# Patient Record
Sex: Male | Born: 1935 | Race: White | Hispanic: Yes
Health system: Southern US, Community
[De-identification: ages and names within clinical notes are randomized; demographics above are authoritative.]

## PROBLEM LIST (undated history)

## (undated) DIAGNOSIS — C801 Malignant (primary) neoplasm, unspecified: Secondary | ICD-10-CM

## (undated) DIAGNOSIS — H269 Unspecified cataract: Secondary | ICD-10-CM

## (undated) DIAGNOSIS — I10 Essential (primary) hypertension: Secondary | ICD-10-CM

## (undated) DIAGNOSIS — K219 Gastro-esophageal reflux disease without esophagitis: Secondary | ICD-10-CM

## (undated) DIAGNOSIS — F419 Anxiety disorder, unspecified: Secondary | ICD-10-CM

## (undated) DIAGNOSIS — F329 Major depressive disorder, single episode, unspecified: Secondary | ICD-10-CM

## (undated) DIAGNOSIS — F32A Depression, unspecified: Secondary | ICD-10-CM

## (undated) DIAGNOSIS — E785 Hyperlipidemia, unspecified: Secondary | ICD-10-CM

## (undated) HISTORY — DX: Hyperlipidemia, unspecified: E78.5

## (undated) HISTORY — PX: FRACTURE SURGERY: SHX138

## (undated) HISTORY — DX: Gastro-esophageal reflux disease without esophagitis: K21.9

## (undated) HISTORY — DX: Unspecified cataract: H26.9

## (undated) HISTORY — PX: HERNIA REPAIR: SHX51

## (undated) HISTORY — DX: Essential (primary) hypertension: I10

---

## 2000-02-25 ENCOUNTER — Encounter: Payer: Self-pay | Admitting: *Deleted

## 2000-02-25 ENCOUNTER — Encounter: Admission: RE | Admit: 2000-02-25 | Discharge: 2000-02-25 | Payer: Self-pay | Admitting: *Deleted

## 2003-03-22 ENCOUNTER — Inpatient Hospital Stay (HOSPITAL_COMMUNITY): Admission: EM | Admit: 2003-03-22 | Discharge: 2003-03-26 | Payer: Self-pay | Admitting: Psychiatry

## 2003-03-27 ENCOUNTER — Other Ambulatory Visit (HOSPITAL_COMMUNITY): Admission: RE | Admit: 2003-03-27 | Discharge: 2003-03-29 | Payer: Self-pay | Admitting: Psychiatry

## 2003-06-06 ENCOUNTER — Encounter: Admission: RE | Admit: 2003-06-06 | Discharge: 2003-06-15 | Payer: Self-pay | Admitting: Internal Medicine

## 2003-07-05 ENCOUNTER — Encounter: Admission: RE | Admit: 2003-07-05 | Discharge: 2003-10-03 | Payer: Self-pay | Admitting: Internal Medicine

## 2003-09-07 ENCOUNTER — Ambulatory Visit (HOSPITAL_COMMUNITY): Admission: RE | Admit: 2003-09-07 | Discharge: 2003-09-07 | Payer: Self-pay | Admitting: Internal Medicine

## 2004-10-18 ENCOUNTER — Inpatient Hospital Stay (HOSPITAL_COMMUNITY): Admission: EM | Admit: 2004-10-18 | Discharge: 2004-10-19 | Payer: Self-pay | Admitting: Emergency Medicine

## 2004-12-07 ENCOUNTER — Inpatient Hospital Stay (HOSPITAL_COMMUNITY): Admission: EM | Admit: 2004-12-07 | Discharge: 2004-12-10 | Payer: Self-pay | Admitting: *Deleted

## 2004-12-09 ENCOUNTER — Encounter: Payer: Self-pay | Admitting: Internal Medicine

## 2004-12-09 ENCOUNTER — Ambulatory Visit: Payer: Self-pay | Admitting: Internal Medicine

## 2005-10-08 ENCOUNTER — Other Ambulatory Visit (HOSPITAL_COMMUNITY): Admission: RE | Admit: 2005-10-08 | Discharge: 2005-10-23 | Payer: Self-pay | Admitting: Psychiatry

## 2005-10-09 ENCOUNTER — Ambulatory Visit: Payer: Self-pay | Admitting: Psychiatry

## 2005-10-29 ENCOUNTER — Ambulatory Visit (HOSPITAL_COMMUNITY): Payer: Self-pay | Admitting: *Deleted

## 2005-12-01 ENCOUNTER — Ambulatory Visit (HOSPITAL_COMMUNITY): Payer: Self-pay | Admitting: *Deleted

## 2006-02-05 ENCOUNTER — Ambulatory Visit (HOSPITAL_COMMUNITY): Payer: Self-pay | Admitting: *Deleted

## 2006-06-29 ENCOUNTER — Ambulatory Visit: Payer: Self-pay | Admitting: Gastroenterology

## 2006-07-09 ENCOUNTER — Ambulatory Visit: Payer: Self-pay | Admitting: Gastroenterology

## 2006-07-09 LAB — CONVERTED CEMR LAB
Ferritin: 51.6 ng/mL (ref 22.0–322.0)
Folate: 17.6 ng/mL
Iron: 30 ug/dL — ABNORMAL LOW (ref 42–165)
Saturation Ratios: 10.5 % — ABNORMAL LOW (ref 20.0–50.0)
Transferrin: 203.4 mg/dL — ABNORMAL LOW (ref >=212.0)
Vitamin B-12: 415 pg/mL (ref 211–911)

## 2006-07-10 ENCOUNTER — Ambulatory Visit: Payer: Self-pay | Admitting: Gastroenterology

## 2006-07-10 ENCOUNTER — Encounter (INDEPENDENT_AMBULATORY_CARE_PROVIDER_SITE_OTHER): Payer: Self-pay | Admitting: Specialist

## 2007-02-15 ENCOUNTER — Encounter: Admission: RE | Admit: 2007-02-15 | Discharge: 2007-02-15 | Payer: Self-pay | Admitting: Internal Medicine

## 2007-03-04 ENCOUNTER — Emergency Department (HOSPITAL_COMMUNITY): Admission: EM | Admit: 2007-03-04 | Discharge: 2007-03-04 | Payer: Self-pay | Admitting: Emergency Medicine

## 2008-10-05 ENCOUNTER — Ambulatory Visit: Payer: Self-pay | Admitting: Internal Medicine

## 2008-10-05 ENCOUNTER — Inpatient Hospital Stay (HOSPITAL_COMMUNITY): Admission: EM | Admit: 2008-10-05 | Discharge: 2008-10-06 | Payer: Self-pay | Admitting: Emergency Medicine

## 2008-10-06 ENCOUNTER — Encounter (INDEPENDENT_AMBULATORY_CARE_PROVIDER_SITE_OTHER): Payer: Self-pay | Admitting: *Deleted

## 2009-10-05 ENCOUNTER — Encounter: Payer: Self-pay | Admitting: Gastroenterology

## 2009-11-30 ENCOUNTER — Encounter: Payer: Self-pay | Admitting: Gastroenterology

## 2010-02-07 ENCOUNTER — Encounter: Payer: Self-pay | Admitting: Gastroenterology

## 2010-02-07 ENCOUNTER — Encounter: Admission: RE | Admit: 2010-02-07 | Discharge: 2010-02-07 | Payer: Self-pay | Admitting: Family Medicine

## 2010-02-12 ENCOUNTER — Encounter: Payer: Self-pay | Admitting: Gastroenterology

## 2010-02-28 ENCOUNTER — Encounter (INDEPENDENT_AMBULATORY_CARE_PROVIDER_SITE_OTHER): Payer: Self-pay | Admitting: *Deleted

## 2010-03-06 DIAGNOSIS — F329 Major depressive disorder, single episode, unspecified: Secondary | ICD-10-CM | POA: Insufficient documentation

## 2010-03-06 DIAGNOSIS — K589 Irritable bowel syndrome without diarrhea: Secondary | ICD-10-CM | POA: Insufficient documentation

## 2010-03-06 DIAGNOSIS — R05 Cough: Secondary | ICD-10-CM

## 2010-03-06 DIAGNOSIS — F3289 Other specified depressive episodes: Secondary | ICD-10-CM | POA: Insufficient documentation

## 2010-03-06 DIAGNOSIS — K449 Diaphragmatic hernia without obstruction or gangrene: Secondary | ICD-10-CM | POA: Insufficient documentation

## 2010-03-06 DIAGNOSIS — E7800 Pure hypercholesterolemia, unspecified: Secondary | ICD-10-CM | POA: Insufficient documentation

## 2010-03-06 DIAGNOSIS — K5730 Diverticulosis of large intestine without perforation or abscess without bleeding: Secondary | ICD-10-CM | POA: Insufficient documentation

## 2010-03-06 DIAGNOSIS — R059 Cough, unspecified: Secondary | ICD-10-CM | POA: Insufficient documentation

## 2010-03-06 DIAGNOSIS — N200 Calculus of kidney: Secondary | ICD-10-CM | POA: Insufficient documentation

## 2010-03-06 DIAGNOSIS — E119 Type 2 diabetes mellitus without complications: Secondary | ICD-10-CM | POA: Insufficient documentation

## 2010-03-07 ENCOUNTER — Ambulatory Visit: Payer: Self-pay | Admitting: Gastroenterology

## 2010-03-07 ENCOUNTER — Encounter (INDEPENDENT_AMBULATORY_CARE_PROVIDER_SITE_OTHER): Payer: Self-pay | Admitting: *Deleted

## 2010-03-07 DIAGNOSIS — F411 Generalized anxiety disorder: Secondary | ICD-10-CM | POA: Insufficient documentation

## 2010-03-07 DIAGNOSIS — R490 Dysphonia: Secondary | ICD-10-CM | POA: Insufficient documentation

## 2010-03-07 DIAGNOSIS — K219 Gastro-esophageal reflux disease without esophagitis: Secondary | ICD-10-CM | POA: Insufficient documentation

## 2010-03-07 DIAGNOSIS — I10 Essential (primary) hypertension: Secondary | ICD-10-CM | POA: Insufficient documentation

## 2010-04-15 ENCOUNTER — Telehealth: Payer: Self-pay | Admitting: Gastroenterology

## 2010-04-16 ENCOUNTER — Ambulatory Visit (HOSPITAL_COMMUNITY): Admission: RE | Admit: 2010-04-16 | Discharge: 2010-04-16 | Payer: Self-pay | Admitting: Internal Medicine

## 2010-04-16 ENCOUNTER — Ambulatory Visit: Payer: Self-pay | Admitting: Gastroenterology

## 2010-04-16 ENCOUNTER — Ambulatory Visit: Payer: Self-pay | Admitting: Internal Medicine

## 2010-05-10 ENCOUNTER — Ambulatory Visit
Admission: RE | Admit: 2010-05-10 | Discharge: 2010-06-26 | Payer: Self-pay | Source: Home / Self Care | Attending: Radiation Oncology | Admitting: Radiation Oncology

## 2010-05-21 ENCOUNTER — Encounter: Admission: RE | Admit: 2010-05-21 | Discharge: 2010-05-21 | Payer: Self-pay | Admitting: Urology

## 2010-07-08 ENCOUNTER — Ambulatory Visit
Admission: RE | Admit: 2010-07-08 | Discharge: 2010-07-08 | Payer: Self-pay | Source: Home / Self Care | Attending: Urology | Admitting: Urology

## 2010-07-15 LAB — GLUCOSE, CAPILLARY
Glucose-Capillary: 113 mg/dL — ABNORMAL HIGH (ref 70–99)
Glucose-Capillary: 104 mg/dL — ABNORMAL HIGH (ref 70–99)

## 2010-07-25 ENCOUNTER — Ambulatory Visit
Admission: RE | Admit: 2010-07-25 | Discharge: 2010-07-30 | Payer: Self-pay | Source: Home / Self Care | Attending: Radiation Oncology | Admitting: Radiation Oncology

## 2010-07-30 NOTE — Procedures (Signed)
Summary: ph study  A Bravo 2day acid reflux study was completed on October 21. The bravo probe was placed by Dr. Leone Payor. Results are as follows:  #1 day one of the 2 days study shows increased episodes of acid reflux in the upright position with DeMeester score of 22.8 normal less than 14.7. Fractional time the pH was less than 4 was elevated to 7.3% and 16.5% in the upright position. There were no reflux episodes in the supine position.  #2 day 2 showed no acid reflux of any significance in the upright or supine position. Fractional time the pH was less than 4 was 0.6%. DeMeester score for day 2 was 2.9. Total Evaluation---fractional time the pH was less than 4 was normal and there is really no evidence of significant acid reflux here to explain this patient's symptomatology. Also there is no correlation whatsoever with his symptomatology and acid reflux occurrences. I doubt that this patient's acid reflux is at all related to his ENT symptomatology. We will refer him back to ENT for further care and evaluation. Please send a copy of this to his referring physicians.  Appended Document: ph study left message for pt to call back if questions but he should follow up with his ENT. pt billed

## 2010-07-30 NOTE — Letter (Signed)
Summary: New Patient letter  [facility]  [address] [place], [place] [address]   Phone: [phone]  Fax: [phone]       02/28/2010 MRN: [id number]  [name] [address] [place], [place]  [address]  Dear [name],  Welcome to the Gastroenterology Division at [facility].    You are scheduled to see Dr.  Jarold Motto on 04-16-10 at 10:00A.M. on the 3rd floor at [facility], [address]  We ask that you try to arrive at our office 15 minutes prior to your appointment time to allow for check-in.  We would like you to complete the enclosed self-administered evaluation form prior to your visit and bring it with you on the day of your appointment.  We will review it with you.  Also, please bring a complete list of all your medications or, if you prefer, bring the medication bottles and we will list them.  Please bring your insurance card so that we may make a copy of it.  If your insurance requires a referral to see a specialist, please bring your referral form from your primary care physician.  Co-payments are due at the time of your visit and may be paid by cash, check or credit card.     Your office visit will consist of a consult with your physician (includes a physical exam), any laboratory testing he/she may order, scheduling of any necessary diagnostic testing (e.g. x-ray, ultrasound, CT-scan), and scheduling of a procedure (e.g. Endoscopy, Colonoscopy) if required.  Please allow enough time on your schedule to allow for any/all of these possibilities.    If you cannot keep your appointment, please call [phone] to cancel or reschedule prior to your appointment date.  This allows [place] the opportunity to schedule an appointment for another patient in need of care.  If you do not cancel or reschedule by 5 p.m. the business day prior to your appointment date, you will be charged a $50.00 late cancellation/no-show  fee.    Thank you for choosing [facility] for your medical needs.  We appreciate the opportunity to care for you.  Please visit [place] at our website  to learn more about our practice.                     Sincerely,                                                             The Gastroenterology Division

## 2010-07-30 NOTE — Procedures (Signed)
Summary: Upper Endoscopy  Patient: [name] Note: All result statuses are Final unless otherwise noted.  Tests: (1) Upper Endoscopy (EGD)   EGD Upper Endoscopy       DONE     [facility]     [address] [place], [place]  [address]           ENDOSCOPY PROCEDURE REPORT           PATIENT:  [name], [name]  MR#:  [id number]     BIRTHDATE:  June 13, 1933, 77 yrs. old  GENDER:  male           ENDOSCOPIST:  Iva Boop, MD, [facility]           PROCEDURE DATE:  04/16/2010     PROCEDURE:  EGD, diagnostic, Bravo pH probe placement     ASA CLASS:  Class II     INDICATIONS:  hoarseness, cough, ? atypical GERD           MEDICATIONS:   Fentanyl 40 mcg, Versed 4 mg     TOPICAL ANESTHETIC:  Cetacaine Spray           DESCRIPTION OF PROCEDURE:   After the risks benefits and     alternatives of the procedure were thoroughly explained, informed     consent was obtained.  The  endoscope was introduced through the     mouth and advanced to the second portion of the duodenum, without     limitations.  The instrument was slowly withdrawn as the mucosa     was fully examined.     <<PROCEDUREIMAGES>>           Normal GE junction was noted. Z-line at 39 cm from incisors.     Erythema was found in the body and the antrum of the stomach.     Otherwise the examination was normal.    Retroflexed views revealed     no abnormalities.    The scope was then withdrawn from the     patient. The Bravo pH probe was then placed and placement     confirmed with reinspection using the gastroscope. The procedure     was then completed.           COMPLICATIONS:  None           ENDOSCOPIC IMPRESSION:     1) Normal GE junction, z-line at 39 cm     2) Erythema in the body and the antrum of the stomach     3) Otherwise normal examination     4) Successful Bravo pH probe placement at 33 cm from incisors     RECOMMENDATIONS:     Await Bravo results and Dr. Jarold Motto will  interpret the raw     data. He is not on PPI at this time.           REPEAT EXAM:  as needed           Iva Boop, MD, [facility] Graham           CC:  Sheryn Bison, MD           n.     Rosalie Doctor:   Iva Boop at 04/16/2010 09:28 AM           Harrington Challenger, [id number]  Note: An exclamation mark (!) indicates a result that was not dispersed into the flowsheet. Document Creation Date: 04/16/2010 9:28 AM _______________________________________________________________________  Marland Kitchen  1) Order result status: Final Collection or observation date-time: 04/16/2010 09:15 Requested date-time:  Receipt date-time:  Reported date-time:  Referring Physician:   Ordering Physician: Stan Head [phone]) Specimen Source:  Source: Launa Grill Order Number: [id number] Lab site:

## 2010-07-30 NOTE — Letter (Signed)
Summary: [facility]  [facility]   Imported By: Lester Craven 03/12/2010 10:27:31  _____________________________________________________________________  External Attachment:    Type:   Image     Comment:   External Document

## 2010-07-30 NOTE — Letter (Signed)
Summary: EGD Instructions  [facility]  [address] [place], [place] [address]   Phone: [phone]  Fax: [phone]       GOBLE FUDALA    1932-08-31    MRN: [id number]       Procedure Day /Date: 04/16/2010 Tuesday       Arrival Time:  7:30am     Procedure Time: 8:30am     Location of Procedure:                    [facility] ( Outpatient Registration)   PREPARATION FOR ENDOSCOPY   On 04/16/2010 THE DAY OF THE PROCEDURE:  1.   No solid foods, milk or milk products are allowed after midnight the night before your procedure.  2.   Do not drink anything colored red or purple.  Avoid juices with pulp.  No orange juice.  3.  You may drink clear liquids until 4:30am, which is 4 hours before your procedure.                                                                                                CLEAR LIQUIDS INCLUDE: Water Jello Ice Popsicles Tea (sugar ok, no milk/cream) Powdered fruit flavored drinks Coffee (sugar ok, no milk/cream) Gatorade Juice: apple, white grape, white cranberry  Lemonade Clear bullion, consomm, broth Carbonated beverages (any kind) Strained chicken noodle soup Hard Candy   MEDICATION INSTRUCTIONS  Unless otherwise instructed, you should take regular prescription medications with a small sip of water as early as possible the morning of your procedure.                 OTHER INSTRUCTIONS  You will need a responsible adult at least 75 years of age to accompany you and drive you home.   This person must remain in the waiting room during your procedure.  Wear loose fitting clothing that is easily removed.  Leave jewelry and other valuables at home.  However, you may wish to bring a book to read or an iPod/MP3 player to listen to music as you wait for your procedure to start.  Remove all body piercing jewelry and leave at home.  Total time from sign-in until discharge is approximately 2-3  hours.  You should go home directly after your procedure and rest.  You can resume normal activities the day after your procedure.  The day of your procedure you should not:   Drive   Make legal decisions   Operate machinery   Drink alcohol   Return to work  You will receive specific instructions about eating, activities and medications before you leave.    The above instructions have been reviewed and explained to me by   _______________________    I fully understand and can verbalize these instructions _____________________________ Date _________

## 2010-07-30 NOTE — Procedures (Signed)
Summary: EGD & Pathology   EGD  Procedure date:  07/10/2006  Findings:      Location: [facility]   Patient Name: Jason, Gonzales MRN:  Procedure Procedures: Panendoscopy (EGD) CPT: 43235.    with biopsy(s)/brushing(s). CPT: D1846139.  Personnel: Endoscopist: Vania Rea. Jarold Motto, MD.  Exam Location: Exam performed in Outpatient Clinic. Outpatient  Patient Consent: Procedure, Alternatives, Risks and Benefits discussed, consent obtained, from patient. Consent was obtained by the RN.  Indications  Evaluation of: Anemia,   Symptoms: Abdominal pain, location: epigastric.  Surveillance of: Duodenal ulcer.  History  Current Medications: Patient is not currently taking Coumadin.  Medical/Surgical History: Depression, Adult Onset Diabetes, Hyperlipidemia, Peripheral Neuropathy,  Pre-Exam Physical: Performed Jul 10, 2006  Cardio-pulmonary exam, Abdominal exam, Extremity exam, Mental status exam WNL.  Comments: Pt. history reviewed/updated, physical exam performed prior to initiation of sedation?yes Exam Exam Info: Maximum depth of insertion Duodenum, intended Duodenum. Patient position: on left side. Duration of exam: 10 minutes. Vocal cords visualized. Gastric retroflexion performed. Images taken. ASA Classification: II. Tolerance: good.  Sedation Meds: Patient assessed and found to be appropriate for moderate (conscious) sedation. Cetacaine Spray 2 sprays given aerosolized.  Monitoring: BP and pulse monitoring done. Oximetry used. Supplemental O2 given at 2 Liters.  Instrument(s): GIF 160. Serial [id number].   Findings - Normal: Proximal Esophagus to Distal Esophagus. Not Seen: Tumor. Barrett's esophagus. Mucosal abnormality. Foreign body. Stricture. Varices.  - OTHER FINDING: in Cardia. Comments: Atrophic flat mucosa noted.  - MUCOSAL ABNORMALITY: Pyloric Sphincter to Duodenal 2nd Portion. Erosions present. Nodularity present. Erythematous mucosa.  Red spots present. Granular mucosa. Edema present. ICD9: Duodenitis without Hemorrhage: 535.60.  - MUCOSAL ABNORMALITY: Body to Antrum. Nodularity present. Red spots present. Granular mucosa. Biopsy/Mucosal Abn taken. RUT done, results pending. ICD9: Gastritis, Unspecified: 535.50. Comment: Probable H.pylori...   Assessment  Diagnoses: 535.60: Duodenitis without Hemorrhage. ?? ++ H.pylori.  535.50: Gastritis, Unspecified.   Events  Unplanned Intervention: No unplanned interventions were required.  Plans Medication(s): Await pathology. Continue current medications. PPI: Esomeprazole/Nexium 40 mg prn, starting Jul 10, 2006 for 4 wks.   Disposition: After procedure patient sent to recovery. After recovery patient sent home.  Scheduling: Await pathology to schedule patient.  Comments: Will Rx. if H.pylori confirmed...  CC: Wilson Singer, MD  This report was created from the original endoscopy report, which was reviewed and signed by the above listed endoscopist.    [facility] Surgical Pathology - STATUS: Final             By: Morrie Sheldon,       Perform Date: [id number] 00:01  Ordered By: Juanetta Beets        Ordered Date: 14Jan08 12:19  Facility: [facility]                               Department: CPATH  Service Report Text  [facility]   [address] [address]   [place], [place] [address]   Telephone [phone] or [phone] Fax [phone]    REPORT OF SURGICAL PATHOLOGY    Case #: [id number]   Patient Name: [name], [name]   Office Chart Number: Lonell Grandchild [id number]    MRN: [id number]   Pathologist: Beulah Gandy. Luisa Hart, MD   DOB/Age April 22, 1933 (Age: 77) Gender: M   Date Taken: 07/10/2006   Date Received: 07/13/2006    FINAL DIAGNOSIS    ***MICROSCOPIC  EXAMINATION AND DIAGNOSIS***    STOMACH, BIOPSIES: MINIMAL CHRONIC GASTRITIS. NO HELICOBACTER   PYLORI, DYSPLASIA OR CARCINOMA IDENTIFIED.    COMMENT   A Warthin-Starry stain is performed  to determine the possibility   of the presence of Helicobacter pylori. The Warthin-Starry stain   is negative for organisms of Helicobacter pylori. The control(s)   stained appropriately. (JDP:cdc:07/14/06)    cc   Date Reported: 07/14/2006 Beulah Gandy. Luisa Hart, MD   *** Electronically Signed Out By JDP ***    Clinical information   Epigastric. Rule out H. pylori. (cdc)    specimen(s) obtained   Stomach, biopsy    Gross Description   Received in formalin are tan, soft tissue fragments that are   submitted in toto. Number: two.   Size: 0.3 and 0.7 cm, one block. (SW:gt, 07/13/06)    gdt/

## 2010-07-30 NOTE — Letter (Signed)
Summary: [facility]  [facility]   Imported By: Lester [place] 03/12/2010 10:29:17  _____________________________________________________________________  External Attachment:    Type:   Image     Comment:   External Document

## 2010-07-30 NOTE — Progress Notes (Signed)
Summary: pt here   Phone Note Call from Patient   Caller: Pt is here Call For: Dr Jarold Motto Reason for Call: Talk to Nurse Summary of Call: Is here out at waiting room. Says he is sick andthinks he might not be able  to go thru procedure tomorrow at hospital. Initial call taken by: Leanor Kail [facility],  April 15, 2010 4:20 PM  Follow-up for Phone Call        Patient walked in this afternoon.  He is scheduled for a Bravo pH study by Dr Leone Payor for tomorrow.  He had a flu shot last week and has had some sinus drainage and a slight sore throat since.  He is not sure if his sore throat if from reflux or from the shot.  He was unsure if he has a fever.  I did take his temp while he was here in the office and he was 98.5.  Patient is advised ok for procedure in the am.  He will keep this appointment .   Follow-up by: Darcey Nora RN, CGRN,  April 15, 2010 4:31 PM  Additional Follow-up for Phone Call Additional follow up Details #1::        i agree...good job... Additional Follow-up by: Mardella Layman MD FACG,  April 15, 2010 4:32 PM    Additional Follow-up for Phone Call Additional follow up Details #2::    he showed up without a driver he is making some phone calls about that we'll see Follow-up by: Iva Boop MD, [facility],  April 16, 2010 8:15 AM

## 2010-07-30 NOTE — Assessment & Plan Note (Signed)
Summary: ACID REFLUX--ch.   History of Present Illness Visit Type: consult  Primary GI MD: Sheryn Bison MD FACP FAGA Primary Provider: Ralene Ok, MD  Requesting Provider: Janese Banks, MD Chief Complaint: GERD, and constipation  History of Present Illness:   77 year old Hispanic male referred by Dr. Pollyann Kennedy for evaluation of chronic hoarseness and globus sensation unresponsive to courses of PPI therapy. I previously saw this patient in 2008, and he had a negative endoscopic exam. He does take lisinopril for blood pressure control, but has no symptoms of coughing. He also previously was on methotrexate for macular degeneration as recommended by an ophthalmologist from [facility]. Apparently blood work has all been normal.  He denies any reflux symptoms of burning substernal chest pain or regurgitation. He also has no dysphagia , chest pain, anorexia, weight loss, or hepatobiliary complaints. His chronic constipation is managed with daily MiraLax. He had a negative colonoscopy 3 years ago. He does have a chronic anxiety syndrome problem and takes Effexor X. are 75 mg a day and lorazepam 0.5 mg twice a day. His glucose intolerance his management Januvia 100 mg a day.  Patient has no other symptoms of autoimmune disease. He does suffer from chronic depression and diverticulosis.   GI Review of Systems    Reports acid reflux and  heartburn.      Denies abdominal pain, belching, bloating, chest pain, dysphagia with liquids, dysphagia with solids, loss of appetite, nausea, vomiting, vomiting blood, weight loss, and  weight gain.      Reports constipation and  diverticulosis.     Denies anal fissure, black tarry stools, change in bowel habit, diarrhea, fecal incontinence, heme positive stool, hemorrhoids, irritable bowel syndrome, jaundice, light color stool, liver problems, rectal bleeding, and  rectal pain.    Current Medications (verified): 1)  Januvia 100 Mg Tabs (Sitagliptin  Phosphate) .... One Tablet By Mouth Once Daily 2)  Lipitor 20 Mg Tabs (Atorvastatin Calcium) .... One Tablet By Mouth Once Daily 3)  Effexor Xr 75 Mg Xr24h-Cap (Venlafaxine Hcl) .... One Capsule By Mouth Three Times A Day 4)  Lorazepam 0.5 Mg Tabs (Lorazepam) .Marland Kitchen.. 1-2 Tablets By Mouth Once Daily 5)  Folic Acid 1 Mg Tabs (Folic Acid) .... One Tablet By Mouth Once Daily 6)  Fish Oil 1200 Mg Caps (Omega-3 Fatty Acids) .... One Capsule By Mouth Once Daily 7)  Centrum Silver  Tabs (Multiple Vitamins-Minerals) .... One Tablet By Mouth Once Daily 8)  Eye Vitamins  Caps (Multiple Vitamins-Minerals) .... One Capsule By Mouth Once Daily 9)  Calcium 600 1500 Mg Tabs (Calcium Carbonate) .... One Tablet By Mouth Once Daily 10)  Lisinopril 10 Mg Tabs (Lisinopril) .... One Tablet By Mouth Once Daily 11)  Citrucel 500 Mg Tabs (Methylcellulose (Laxative)) .... Two Tablets By Mouth Once Daily 12)  Miralax  Powd (Polyethylene Glycol 3350) .... One Capful As Needed  Allergies (verified): No Known Drug Allergies  Past History:  Past medical, surgical, family and social histories (including risk factors) reviewed for relevance to current acute and chronic problems.  Past Medical History: GERD (ICD-530.81) ANXIETY (ICD-300.00) COUGH (ICD-786.2) IBS (ICD-564.1) RENAL CALCULUS, RECURRENT (ICD-592.0) HYPERCHOLESTEROLEMIA (ICD-272.0) DIABETES MELLITUS (ICD-250.00) DEPRESSION (ICD-311) HIATAL HERNIA WITH REFLUX (ICD-553.3) ADENOCARCINOMA, COLON, FAMILY HX (ICD-V16.0) DIVERTICULOSIS, COLON (ICD-562.10)  Past Surgical History: Reviewed history from 03/06/2010 and no changes required. inguinal hernia surgery  Family History: Reviewed history from 03/06/2010 and no changes required. Family History of Colon Cancer: Father   Social History: Reviewed history from 03/06/2010  and no changes required. Occupation: retired from [employer] he has a PhD Lives alone and divorced Childern  Patient has never  smoked.  Alcohol Use - no Illicit Drug Use - no Daily Caffeine Use: diet coke occ  Review of Systems       The patient complains of anxiety-new, blood in urine, cough, depression-new, sore throat, urination - excessive, and voice change.  The patient denies allergy/sinus, anemia, arthritis/joint pain, back pain, breast changes/lumps, change in vision, confusion, coughing up blood, fainting, fatigue, fever, headaches-new, hearing problems, heart murmur, heart rhythm changes, itching, muscle pains/cramps, night sweats, nosebleeds, shortness of breath, skin rash, sleeping problems, swelling of feet/legs, swollen lymph glands, thirst - excessive, urination changes/pain, urine leakage, and vision changes.    Vital Signs:  Patient profile:   77 year old male Height:      67 inches Weight:      173 pounds BMI:     27.19 BSA:     1.90 Pulse rate:   68 / minute Pulse rhythm:   regular BP sitting:   126 / 74  (left arm) Cuff size:   regular  Vitals Entered By: Ok Anis CMA (March 07, 2010 8:26 AM)  Physical Exam  General:  Well developed, well nourished, no acute distress.healthy appearing.   Head:  Normocephalic and atraumatic. Eyes:  PERRLA, no icterus.exam deferred to patient's ophthalmologist.   Mouth:  No deformity or lesions, dentition normal. Neck:  Supple; no masses or thyromegaly. Lungs:  Clear throughout to auscultation. Abdomen:  Soft, nontender and nondistended. No masses, hepatosplenomegaly or hernias noted. Normal bowel sounds. Extremities:  No clubbing, cyanosis, edema or deformities noted. Neurologic:  Alert and  oriented x4;  grossly normal neurologically. Cervical Nodes:  No significant cervical adenopathy. Psych:  Alert and cooperative. Normal mood and affect.   Impression & Recommendations:  Problem # 1:  HOARSENESS, CHRONIC (ICD-784.42) Assessment Unchanged Possible extra esophageal manifestation of acid reflux versus functional disorder versus reaction to  lisinopril. The patient is reluctant to stop his lisinopril we'll continue PPI therapy. I have scheduled him for endoscopic exam with Dr. Concha Se with placement of a Bravo 24-hour pH probe can be done. Once we have the results of this diagnostic procedure we can decide whether or not to proceed with manometry, double P. PI therapy, or medication adjustments. In the interim, I have placed him on Librax p.o. t.i.d. before meals. He will stop this medication 72 hours before his endoscopic procedure.  Problem # 2:  ANXIETY (ICD-300.00) Assessment: Improved  Problem # 3:  DEPRESSION (ICD-311) Assessment: Improved  Problem # 4:  ESSENTIAL HYPERTENSION, BENIGN (ICD-401.1) Assessment: Improved blood pressure today is 126/74. I suspect we'll need to find another blood pressure medication besides an Ace inhibitor.  Other Orders: Bravo Ph Probe (Bravo Ph)  Patient Instructions: 1)  Copy sent to : Janese Banks, MD, Ralene Ok, MD  2)  Your Endo with Bravo placement has been scheduled for 04/16/2010 at [facility], please follow the seperate instructions.  3)  Please continue current medications.  4)  The medication list was reviewed and reconciled.  All changed / newly prescribed medications were explained.  A complete medication list was provided to the patient / caregiver.  Appended Document: ACID REFLUX--ch.    Clinical Lists Changes  Medications: Added new medication of LIBRAX 2.5-5 MG CAPS (CLIDINIUM-CHLORDIAZEPOXIDE) take one by mouth three times a day - Signed Rx of LIBRAX 2.5-5 MG CAPS (CLIDINIUM-CHLORDIAZEPOXIDE) take one by mouth  three times a day;  #90 x 0;  Signed;  Entered by: Harlow Mares CMA (AAMA);  Authorized by: Mardella Layman MD [facility];  Method used: Electronically to [id number] [address] [place], [place]  [address], Ph: [phone] or [phone], Fax: [phone]    Prescriptions: LIBRAX 2.5-5 MG CAPS (CLIDINIUM-CHLORDIAZEPOXIDE) take one by mouth three times  a day  #90 x 0   Entered by:   Harlow Mares CMA (AAMA)   Authorized by:   Mardella Layman MD [facility]   Signed by:   Harlow Mares CMA (AAMA) on 03/07/2010   Method used:   Electronically to        [id number] (retail)       [address]       [place], [place]  [address]       Ph: [phone] or [phone]       Fax: [phone]   RxID:   [id number]

## 2010-07-30 NOTE — Procedures (Signed)
Summary: Colonoscopy   Colonoscopy  Procedure date:  07/10/2009  Findings:      Location:  [facility].    Colonoscopy  Procedure date:  07/10/2006  Findings:      Location:  [facility].   Patient Name: [name], [name] MRN:  Procedure Procedures: Colonoscopy CPT: 09811.  Personnel: Endoscopist: Vania Rea. Jarold Motto, MD.  Exam Location: Exam performed in Outpatient Clinic. Outpatient  Patient Consent: Procedure, Alternatives, Risks and Benefits discussed, consent obtained, from patient. Consent was obtained by the RN.  Indications  Evaluation of: Anemia  Surveillance of: Adenomatous Polyp(s).  History  Current Medications: Patient is not currently taking Coumadin.  Medical/ Surgical History: Depression, Adult Onset Diabetes, Hyperlipidemia, Peripheral Neuropathy,  Pre-Exam Physical: Performed Jul 10, 2006. Cardio-pulmonary exam, Rectal exam, Abdominal exam, Extremity exam, Mental status exam WNL.  Comments: Pt. history reviewed/updated, physical exam performed prior to initiation of sedation?yes Exam Exam: Extent of exam reached: Cecum, extent intended: Cecum.  The cecum was identified by appendiceal orifice and IC valve. Patient position: on left side. Time to Cecum: 00:03:02. Time for Withdrawl: 00:04:45. Colon retroflexion performed. Images taken. ASA Classification: II. Tolerance: excellent.  Monitoring: Pulse and BP monitoring, Oximetry used. Supplemental O2 given. at 2 Liters.  Colon Prep Used Golytely for colon prep. Prep results: good.  Sedation Meds: Patient assessed and found to be appropriate for moderate (conscious) sedation. Versed 5 mg. given IV.  Instrument(s): CF 140L. Serial [id number].  Comments: NARCOTICS contraindicated with his MAO inhibitor Rx.!!!!!!!!!!!!!!!!!! !!!!!!! Findings - DIVERTICULOSIS: Descending Colon to Sigmoid Colon. Not bleeding. ICD9: Diverticulosis, Colon: 562.10.  - NORMAL EXAM:  Cecum to Rectum. Not Seen: Polyps. AVM's. Colitis. Tumors. Melanosis. Crohn's. Hemorrhoids.   Assessment Normal examination.  Diagnoses: 562.10: Diverticulosis, Colon.   Events  Unplanned Interventions: No intervention was required.  Plans Medication Plan: Referring provider to order medications.  Patient Education: Patient given standard instructions for: Diverticulosis.  Disposition: After procedure patient sent to recovery. After recovery patient sent home.  Scheduling/Referral: Follow-Up prn.    CC: Wilson Singer, MD  This report was created from the original endoscopy report, which was reviewed and signed by the above listed endoscopist.     Signed by Harlow Mares CMA (AAMA) on 03/06/2010 at 4:57 PM

## 2010-08-02 NOTE — Procedures (Signed)
Summary: Endo Prep/[facility]  Endo Prep/[facility]   Imported By: Lester [place] 03/11/2010 11:24:31  _____________________________________________________________________  External Attachment:    Type:   Image     Comment:   External Document

## 2010-08-02 NOTE — Letter (Signed)
Summary: [facility]  [facility]   Imported By: Lester  03/12/2010 10:21:02  _____________________________________________________________________  External Attachment:    Type:   Image     Comment:   External Document

## 2010-08-12 ENCOUNTER — Ambulatory Visit: Payer: MEDICARE | Attending: Radiation Oncology | Admitting: Radiation Oncology

## 2010-08-12 DIAGNOSIS — C61 Malignant neoplasm of prostate: Secondary | ICD-10-CM | POA: Insufficient documentation

## 2010-09-09 LAB — APTT: aPTT: 30 s (ref 24–37)

## 2010-09-09 LAB — COMPREHENSIVE METABOLIC PANEL WITH GFR
ALT: 33 U/L (ref 0–53)
AST: 35 U/L (ref 0–37)
Albumin: 3.6 g/dL (ref 3.5–5.2)
Alkaline Phosphatase: 73 U/L (ref 39–117)
BUN: 17 mg/dL (ref 6–23)
CO2: 32 meq/L (ref 19–32)
Calcium: 9.8 mg/dL (ref 8.4–10.5)
Chloride: 105 meq/L (ref 96–112)
Creatinine, Ser: 1.23 mg/dL (ref 0.4–1.5)
GFR calc non Af Amer: 57 mL/min — ABNORMAL LOW
Glucose, Bld: 115 mg/dL — ABNORMAL HIGH (ref 70–99)
Potassium: 4.5 meq/L (ref 3.5–5.1)
Sodium: 144 meq/L (ref 135–145)
Total Bilirubin: 0.4 mg/dL (ref 0.3–1.2)
Total Protein: 6.4 g/dL (ref 6.0–8.3)

## 2010-09-09 LAB — CBC
HCT: 45.2 % (ref 39.0–52.0)
Hemoglobin: 14.5 g/dL (ref 13.0–17.0)
MCH: 29.3 pg (ref 26.0–34.0)
MCHC: 32.1 g/dL (ref 30.0–36.0)
MCV: 91.3 fL (ref 78.0–100.0)
Platelets: 214 10*3/uL (ref 150–400)
RBC: 4.95 MIL/uL (ref 4.22–5.81)
RDW: 12.8 % (ref 11.5–15.5)
WBC: 6.4 10*3/uL (ref 4.0–10.5)

## 2010-09-09 LAB — PROTIME-INR
INR: 1.06 (ref 0.00–1.49)
Prothrombin Time: 14 seconds (ref 11.6–15.2)

## 2010-09-11 LAB — GLUCOSE, CAPILLARY: Glucose-Capillary: 111 mg/dL — ABNORMAL HIGH (ref 70–99)

## 2010-10-09 LAB — CBC
HCT: 41.1 % (ref 39.0–52.0)
HCT: 42.3 % (ref 39.0–52.0)
Hemoglobin: 14.3 g/dL (ref 13.0–17.0)
MCHC: 33.9 g/dL (ref 30.0–36.0)
MCV: 88.2 fL (ref 78.0–100.0)
MCV: 88.7 fL (ref 78.0–100.0)
Platelets: 160 10*3/uL (ref 150–400)
Platelets: 171 10*3/uL (ref 150–400)
RBC: 4.8 MIL/uL (ref 4.22–5.81)
RDW: 13.1 % (ref 11.5–15.5)
RDW: 13.2 % (ref 11.5–15.5)
WBC: 7 10*3/uL (ref 4.0–10.5)

## 2010-10-09 LAB — COMPREHENSIVE METABOLIC PANEL
ALT: 13 U/L (ref 0–53)
AST: 20 U/L (ref 0–37)
Albumin: 3.2 g/dL — ABNORMAL LOW (ref 3.5–5.2)
Albumin: 3.5 g/dL (ref 3.5–5.2)
Alkaline Phosphatase: 76 U/L (ref 39–117)
BUN: 19 mg/dL (ref 6–23)
BUN: 19 mg/dL (ref 6–23)
CO2: 29 mEq/L (ref 19–32)
Calcium: 8.9 mg/dL (ref 8.4–10.5)
Chloride: 110 mEq/L (ref 96–112)
Creatinine, Ser: 0.89 mg/dL (ref 0.4–1.5)
Creatinine, Ser: 0.94 mg/dL (ref 0.4–1.5)
GFR calc Af Amer: >60 mL/min (ref >60)
GFR calc non Af Amer: >60 mL/min (ref >60)
Glucose, Bld: 101 mg/dL — ABNORMAL HIGH (ref 70–99)
Glucose, Bld: 113 mg/dL — ABNORMAL HIGH (ref 70–99)
Potassium: 4.1 mEq/L (ref 3.5–5.1)
Sodium: 143 mEq/L (ref 135–145)
Total Bilirubin: 0.8 mg/dL (ref 0.3–1.2)
Total Bilirubin: 1 mg/dL (ref 0.3–1.2)
Total Protein: 5.6 g/dL — ABNORMAL LOW (ref 6.0–8.3)
Total Protein: 6 g/dL (ref 6.0–8.3)

## 2010-10-09 LAB — PROTIME-INR
INR: 1.1 (ref 0.00–1.49)
Prothrombin Time: 14.5 seconds (ref 11.6–15.2)

## 2010-10-09 LAB — DIFFERENTIAL
Basophils Absolute: 0 10*3/uL (ref 0.0–0.1)
Basophils Relative: 0 % (ref 0–1)
Eosinophils Absolute: 0.3 10*3/uL (ref 0.0–0.7)
Eosinophils Relative: 4 % (ref 0–5)
Lymphocytes Relative: 21 % (ref 12–46)
Lymphs Abs: 1.5 10*3/uL (ref 0.7–4.0)
Monocytes Absolute: 0.4 10*3/uL (ref 0.1–1.0)
Monocytes Relative: 6 % (ref 3–12)
Neutro Abs: 4.8 10*3/uL (ref 1.7–7.7)
Neutrophils Relative %: 68 % (ref 43–77)

## 2010-10-09 LAB — POCT CARDIAC MARKERS
CKMB, poc: <1 ng/mL — ABNORMAL LOW (ref 1.0–8.0)
Myoglobin, poc: 47.8 ng/mL (ref 12–200)
Troponin i, poc: <0.05 ng/mL (ref 0.00–0.09)

## 2010-10-09 LAB — APTT: aPTT: 28 seconds (ref 24–37)

## 2010-10-09 LAB — URINALYSIS, ROUTINE W REFLEX MICROSCOPIC
Bilirubin Urine: NEGATIVE
Glucose, UA: NEGATIVE mg/dL
Hgb urine dipstick: NEGATIVE
Ketones, ur: NEGATIVE mg/dL
Nitrite: NEGATIVE
Protein, ur: NEGATIVE mg/dL
Specific Gravity, Urine: 1.024 (ref 1.005–1.030)
Urobilinogen, UA: 0.2 mg/dL (ref 0.0–1.0)
pH: 5.5 (ref 5.0–8.0)

## 2010-10-09 LAB — CARDIAC PANEL(CRET KIN+CKTOT+MB+TROPI): Troponin I: 0.04 ng/mL (ref 0.00–0.06)

## 2010-10-09 LAB — MAGNESIUM: Magnesium: 2 mg/dL (ref 1.5–2.5)

## 2010-10-09 LAB — TSH: TSH: 0.889 u[IU]/mL (ref 0.350–4.500)

## 2010-10-09 LAB — PHOSPHORUS: Phosphorus: 3.2 mg/dL (ref 2.3–4.6)

## 2010-11-12 NOTE — H&P (Signed)
NAMEEARSEL, SHOUSE NO.:  [id number]   MEDICAL RECORD NO.:  [id number]          PATIENT TYPE:  EMS   LOCATION:  ED                           FACILITY:  [facility]   PHYSICIAN:  Lucita Ferrara, MD         DATE OF BIRTH:  1932/09/04   DATE OF ADMISSION:  10/05/2008  DATE OF DISCHARGE:                              HISTORY & PHYSICAL   PRIMARY CARE PHYSICIAN:  Dr. Ludwig Clarks   CHIEF COMPLAINT:  Orthostasis and hypotension.   The patient is a 77 year old male who presents with labile blood  pressures.  His symptoms essentially started about a month ago at which  time he fell to the ground.  He went to an Urgent Care Center where  they told them that he should go off his Propranolol.  He was taken off  Propranolol.  Today, in the emergency room, he was witnessed to have a  blood pressure of 78/52.  His orthostatics are positive.  Otherwise, his  laboratory results and limited workup in the emergency room is negative.  The patient denies any new focal neurological deficits, fevers or  chills, chest pain, shortness of breath, slurred speech.  Symptoms of  hypotension and dizziness are upon ambulation first getting out of bed.  He is on a monamine inhibitor for depression, he is also on an alpha  blocker for his prostate.  Otherwise, 12-point review of systems is  negative.   ALLERGIES:  Allergic to Actos, Neurontin.   MEDICATIONS AT HOME:  1. Nardil which is an MAOI 15 mg p.o. daily.  2. Ativan 0.5 mg p.o. b.i.d.  3. Multivitamin 1 tablet p.o. daily.  4. Lipitor 20 mg p.o. daily.  5. Temazepam 30 mg at bedtime.  6. Flomax 0.4 mg at bedtime.  7. Restoril 30 mg daily.  8. Januvia 100 mg p.o. once daily.  9. Omeprazole 20 mg p.o. daily   REVIEW OF SYSTEMS:  As per HPI, otherwise negative.   FAMILY HISTORY:  Noncontributory.   SOCIAL HISTORY:  The patient denies drugs, alcohol or tobacco.   PHYSICAL EXAMINATION:  GENERAL:  The patient is in no acute distress.  HEENT:  Normocephalic, atraumatic.  Sclerae is anicteric.  Neck:  Supple.  No JVD or carotid bruits.  PERLA.  Extraocular muscles intact.  CARDIOVASCULAR:  S1, S3.  Regular rate and rhythm.  No murmurs, rubs or  clicks.  LUNGS: Clear to auscultation bilaterally without rhonchi, rales or  wheezes.  ABDOMEN: Soft, nontender, nondistended.  Positive bowel sounds.  EXTREMITIES: No clubbing, cyanosis or edema.  NEURO:  The patient is oriented x3.  Cranial nerves II-XII grossly  intact.  Blood pressure 128/66, pulse 54, respirations 18, temperature 98.3,  pulse ox 90% on room air.   EKG shows sinus bradycardia.  No ST-T wave changes.   OTHER PAST MEDICAL HISTORY:  1. Includes history of orthostatic hypotension thought to be secondary      to Nardil.  2. Bipolar disorder.  History of  involuntary commitment from      [facility].  3. Hyperlipidemia.  4. Type 2 diabetes.  5.  Gastroesophageal reflux disease.  6. Status post right hand surgery.   ASSESSMENT:  The patient is 75-year with:  1. Orthostatic hypotension which could be secondary to a combination      of Flomax and Nardil versus both. The patient is also mildly      bradycardic, does not have tachycardic response to hypotension.      Rule out cardiogenic cause of hypotension and cardiogenic syncope.  2. History of involuntary commitment to [facility].  3. History of bipolar disorder.  4. Diabetes type 2.  5. Hyperlipidemia.   PLAN:  1. At this time, the patient will be admitted to the medical telemetry      floor.  He will be put on a cardiac monitor.  The patient will get      cardiac enzymes x3 q.8 h.  We will go ahead and proceed with a 2-D      echocardiogram.  We will go ahead and continue his Nardil for now.  2. We may have to proceed with a Psychiatry consultation.  3. We will hold his Flomax for now.  We will need to find an      alternative for his Flomax.  The patient's hypotension may also be      secondary  to lorazepam.  4. Strict fall precautions.  5. For diabetes, we will continue Januvia.  6. We will go ahead and get appropriate consultations, Cardiology and      Psychiatry, for further recommendations.  7. Deep venous thrombosis and gastrointestinal prophylaxis with      Lovenox and Protonix.      Lucita Ferrara, MD  Electronically Signed     RR/MEDQ  D:  10/05/2008  T:  10/05/2008  Job:  [id number]   cc:   Dr. Dewaine Conger

## 2010-11-15 NOTE — Discharge Summary (Signed)
[name], [name]           ACCOUNT NO.:  [id number]   MEDICAL RECORD NO.:  [id number]          PATIENT TYPE:  INP   LOCATION:  1420                         FACILITY:  [facility]   PHYSICIAN:  Mallory Shirk, MD     DATE OF BIRTH:  1933/01/18   DATE OF ADMISSION:  12/07/2004  DATE OF DISCHARGE:                                 DISCHARGE SUMMARY   DISCHARGE DIAGNOSES:  1.  Orthostatic hypotension.  2.  Dizziness and falls secondary to hypotension.  3.  Diabetes mellitus.  4.  Dyslipidemia.   DISCHARGE MEDICATIONS:  1.  Nardil 15 mg p.o. daily x1 week.  At the end of one week the patient      will see Dr. Milford Cage of psychiatry and a new antidepressant will      be started.  2.  Restoril 15 mg p.o. q.h.s. p.r.n. insomnia.  3.  Actos 30 mg p.o. daily.  4.  Lipitor 20 mg p.o. daily.  Please note the changes that have been made in the patient's outpatient  regimen.  1.  Nardil has been changed to 15 mg p.o. daily.  2.  Lorazepam has been discontinued.  3.  Trazodone has been discontinued.  4.  Restoril p.r.n. has been added.   FOLLOWUP APPOINTMENTS:  1.  With Dr. Milford Cage psychiatry on Tuesday, December 17, 2004 at 8:15      a.m., at which time the patient's new antidepressant will be started.      The patient has been advised that it is important to keep this      appointment.  2.  With Dr. Wilson Singer, primary care physician, within one-two weeks      of discharge for hospital followup.   HISTORY OF PRESENT ILLNESS:  [name] is a very pleasant 77 year old  Hispanic gentleman with a past medical history significant for bipolar  disorder, hyperlipidemia, and type 2 diabetes who presented to the emergency  department at [facility] on December 07, 2004 with complaints of  lightheadedness and falls.  The patient was admitted in April 2006 for  lightheadedness and orthostatic hypotension, which was determined to be  secondary to Nardil.  The patient states that he  has been taking Nardil for  a very long time, prescribed by Dr. Milford Cage.  It has been difficult to  change this to any other antidepressant because of the patient being  comfortable with Nardil.  The patient's Nardil was increased to 60 mg p.o.  about a week prior to admission.  The patient was also started on Lorazepam  and trazodone.  He fell going to the bathroom and once coming back from the  bathroom.  Both times he had episodes of urination.  No complaints of  headache, visual changes, chest pain, or shortness of breath.  The patient  states that he felt a loss of balance and transient lightheadedness.  No  head trauma.  No nausea and vomiting or diarrhea.   The patient also denies any history of hypoglycemia.  Upon arrival to the ED  his blood glucose was 113.   PAST  MEDICAL HISTORY:  1.  Orthostatic hypotension believed secondary to Nardil.  2.  Bipolar disorder.  3.  Hyperlipidemia.  4.  Type 2 diabetes mellitus.  5.  GERD.  6.  Status post right hand surgery.  7.  Status post right inguinal hernia repair.   MEDICATIONS ON ADMISSION:  1.  Nardil 60 mg p.o. daily.  2.  Lorazepam 0.5-0.75 mg t.i.d.  3.  Lipitor 20 mg p.o. q.h.s.  4.  Actos 30 mg p.o. daily.  5.  Trazodone 25 mg one-two tablets q.h.s. p.r.n.   ALLERGIES:  NKDA.   PHYSICAL EXAMINATION ON ADMISSION:  VITAL SIGNS:  Blood pressure 127/68  standing, 74/46 supine.  Pulse 63, standing pulse 65.  Respiratory rate 18,  saturations 96% on room air.  GENERAL:  A pleasant mildly overweight Hispanic gentleman in no acute  distress.  Alert and oriented x3.  HEENT:  Normocephalic atraumatic.  PERRL.  Sclerae nonicteric.  Extra ocular  muscles intact.  Tympanic membranes clear bilaterally.  Mucous membranes  mildly dry.  Oropharynx non-erythematous.  NECK:  Supple.  No LAD, no JVD.  LUNGS:  Clear to auscultation bilaterally.  CARDIOVASCULAR:  S1 S2.  Bradycardiac.  No murmurs, rubs, or gallops.  ABDOMEN:   Mildly obese.  Positive bowel sounds.  No tenderness, no masses.  EXTREMITIES:  No cyanosis, clubbing, or edema.  NEUROLOGIC:  Nonfocal.   LABORATORY DATA:  EKG shows sinus bradycardia with a heart rate of 49  beats/minute.  WBC is 5.5, hemoglobin 14.1, hematocrit 41.5, MCV 87,  platelets 195.  Point of care cardiac enzymes negative.  Sodium 142,  potassium 4.3, chloride 108, carbon dioxide 29, glucose 111, BUN 22,  creatinine 1.2, calcium 9.5.   HOSPITAL COURSE:  The patient was admitted to a monitored bed.   Problem 1.  Orthostatic hypotension. The patient was started on IV fluids.  The patient continued to be orthostatic.  However, he was asymptomatic two  days prior to discharge.  He was evaluated by physical therapy and was able  to ambulate without any difficulty with no dizziness or loss of balance.  On  the day of discharge the patient was able to ambulate throughout the entire  hall without difficulty.   The patient was seen by Dr. Jeanie Sewer, psychiatry.  The Nardil has been  tapered down to 15 mg p.o. daily for one week, at which time the patient  will see Dr. Milford Cage, psychiatrist, at which time she will start  another antidepressant.  A discussion was held with Dr. Katrinka Blazing.  She is in  agreement with this plan.  The patient will be tapered off of his MAOI,  Nardil, until we start an SSRI.  The plan now is to have the patient on an  SSRI starting next week.  The patient's trazodone and Lorazepam have also  been discontinued.  It is believed that his orthostasis is secondary to  these medications.  We have started him on Restoril 15 mg p.o. q.h.s. p.r.n.  insomnia.  The patient is agreeable with this plan and is ready to go home.   Problem 2.  Diabetes mellitus.  The patient's Actos was continued.  His  blood sugars were well controlled during the hospital stay.  A hemoglobin  A1c was 6.4.  Problem 3.  Hyperlipidemia.  The patient's Lipitor was continued at 20 mg   p.o. daily.  A lipid profile showed a cholesterol of 111, triglycerides 65,  HDL 33, and LDL 65.  Problem 4.  Bradycardia.  The patient was asymptomatic.  An echocardiogram  showed a normal left ventricular ejection function with an EF of 55-65%.  No  further workup is planned at this time.  Three sets of cardiac enzymes were  negative.   DISPOSITION:  The patient was discharged in stable condition.  He will be  going to [facility].  The patient will be given  compression stockings to wear continuously until his Nardil has been  completely stopped and a new antidepressant started.  The patient has also  been advised to get up from a supine position slowly and sit on the edge of  the bed for a few minutes before getting up and starting to walk.  He has  also been advised to hold onto furniture or the wall if he feels like he is  going to fall.  The patient has verbalized understanding of these  instructions.  He will also take his medications as advised and keep up his  followup appointments.   The patient was also advised to return to the emergency department  immediately upon the onset of shortness of breath, chest pain, or any other  symptom that might need medical attention.       GDK/MEDQ  D:  12/10/2004  T:  12/10/2004  Job:  [id number]   cc:   Jasmine Pang, M.D.  Fax: [phone]   Antonietta Breach, M.D.   Wilson Singer, M.D.  [address]  [place]  [place] [address]  Fax: [phone]

## 2010-11-15 NOTE — Assessment & Plan Note (Signed)
[facility]                         GASTROENTEROLOGY OFFICE NOTE   KONRAD, HOAK                    MRN:          [id number]  DATE:06/29/2006                            DOB:          Jan 24, 1933    [name] is a 77 year old Hispanic male referred by Dr. Karilyn Cota for  evaluation of epigastric abdominal pain.   HISTORY OF PRESENT ILLNESS:  [name] has had 2-3 weeks of  epigastric abdominal pain which occurs approximately 2 hours after  eating and lasts approximately an hour in duration.  It is partially  relieved by antacids.  The pain does awaken him from sleep at night.  He  gives a history of having had a duodenal ulcer at age 67.  He denied  true reflux symptoms or dysphagia, melena, hematochezia, or any  hepatobiliary problems.  I previously saw this patient in 1997 for acid  reflux, and he had a small hiatal hernia noted and was placed on  Prilosec therapy.  At that time, he also had a family history of colon  carcinoma and had a negative colonoscopy in January 2003 except for some  diverticulosis.   The patient has a history of irritable bowel syndrome and has two bowel  movements a day.  His appetite is good, and his weight has been stable.  He recently had a negative physical exam except for mild anemia, with a  hemoglobin of 12.1, with a normal MCV of 88.  Liver function tests and  metabolic profile were normal.   PAST MEDICAL HISTORY:  1. Adult onset diabetes.  2. Hypercholesterolemia.  3. Chronic depression managed only by MAO inhibitors.  4. Recurrent kidney stones.  5. Apparently, he has previous inguinal hernia surgery.   MEDICATIONS:  1. Lipitor 20 mg a day.  2. Actos 30 mg a day.  3. Ativan 1 mg a day.  4. Restoril 15-30 mg at bedtime.  5. Emsam patch 12 mg a day (MAO inhibitor).  6. Flomax 0.4 mg a day.  7. Inderal 20 mg 1-2 tablets a day.   REVIEW OF SYSTEMS:  The patient does have a chronic neuropathy  with  tremor and uses Inderal per a neurologist.  He has chronic anxiety and  depression and is on MAO inhibitors and Ativan.  In addition, he suffers  from hyperlipidemia and diabetes.  He denies any history of known  diabetic neuropathy or ophthalmologic disease, and a recent serum  creatinine level was normal.  He does have some mild chronic insomnia.  He denies current active cardiovascular, pulmonary, or other general  medical problems.   FAMILY HISTORY:  Remarkable for colon cancer in his father in his 24s.   SOCIAL HISTORY:  The patient is divorced and lives alone.  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  He does not smoke, and  denies ethanol abuse.   PHYSICAL EXAMINATION:  GENERAL:  He is a healthy-appearing white male in  no acute distress, appearing his stated age.  VITAL SIGNS:  He is 5 feet 9-1/2 inches tall and weighs 174 pounds.  Blood pressure 102/62, and pulse  was 60 and regular.  HEENT/NECK:  I could not appreciate stigmata of chronic liver disease or  thyromegaly.  CHEST:  Clear.  CARDIOVASCULAR:  I could not appreciate murmurs, gallops, or rubs.  He  was in a regular rhythm.  ABDOMEN:  There was no hepatosplenomegaly or abdominal masses, and only  slight tenderness in the epigastric area.  Bowel sounds were normal.  PERIPHERAL EXTREMITIES:  Unremarkable.  MENTAL STATUS:  Normal.  RECTAL:  Unremarkable.  Stool was guaiac negative.   ASSESSMENT:  1. Probable peptic ulcer disease per his symptomatology.  2. Mild anemia of questionable etiology - rule out possible recurrent      colon polyps.  He last had a colonoscopy in January 2003.  3. Chronic depression, on MAO inhibitors.  4. Well-controlled adult onset diabetes and hypercholesterolemia.  5. History of irritable bowel syndrome.   RECOMMENDATIONS:  1. I have placed him on Nexium 40 mg today and then 30 minutes before      breakfast each morning, with a bland diet and avoidance of NSAIDs.   2. Outpatient endoscopic exam.  We will need to avoid any narcotics      for this procedure because of interaction with MAO inhibitors.  3. Check B12, folate, and iron levels.  4. Consider follow-up colonoscopy exam.  5. Consider other medications as per Dr. Karilyn Cota.     Vania Rea. Jarold Motto, MD, Caleen Essex, FAGA  Electronically Signed    DRP/MedQ  DD: 06/29/2006  DT: 06/29/2006  Job #: [id number]   cc:   Wilson Singer, M.D.

## 2010-11-15 NOTE — H&P (Signed)
[name], [name] NO.:  [id number]   MEDICAL RECORD NO.:  [id number]          PATIENT TYPE:  EMS   LOCATION:  ED                           [facility]ACILITY[facility]   PHYSICIAN:  Renato Battles, M.D.     DATE OF BIRTH:  01-22-1933   DATE OF ADMISSION:  10/18/2004  DATE OF DISCHARGE:                                HISTORY & PHYSICAL   REASON FOR ADMISSION:  Depression and occasional dizziness.   PRIMARY CARE PHYSICIAN:  Dr. Karilyn Cota.   PRIMARY PSYCHIATRIST:  Dr. Milford Cage   HISTORY OF PRESENT ILLNESS:  The patient is a very pleasant 76 year old  Hispanic male, who has a 2 week history of increased depression, decreased  appetite, decreased motivation, no desire to perform activities of daily  living.  He presented to the emergency room requesting some treatment for  merely his worsening depression.  However, in the emergency room, he was  found to be hypotensive and orthostatic.  He was given some IV fluids which  improved his blood pressure; however, he continued to be orthostatic.  On  further questioning, he gave a history of occasional dizziness but no  syncope or presyncope, denied any other complaints.   REVIEW OF SYSTEMS:  CONSTITUTIONAL:  No fevers, chills, or night sweats.  No  weight changes.  GI:  No nausea or vomiting, positive for occasional  constipation.  GU:  No dysuria, hematuria, or hesitation.  CARDIOPULMONARY:  No chest pain, no shortness of breath, no orthopnea, PND, no cough.   PAST MEDICAL HISTORY:  1.  Bipolar disorder.  2.  Diabetes, type 2.  3.  Hypercholesterolemia.   PAST SURGICAL HISTORY:  1.  Right hand surgery.  2.  Right hernia repair.   FAMILY HISTORY:  His brother had lung cancer.   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  No tobacco, alcohol, or  drugs.  He lives in assisted living.   ALLERGIES:  No known drug allergies; however, he reports that a long time  ago he was taking ASPIRIN and developed an ulcer as a  result.   CURRENT MEDICATIONS:  1.  Lipitor 20 mg p.o. daily.  2.  Lorazepam 0.25 mg p.o. t.i.d.  3.  Actos 30 mg p.o. daily.  4.  Nardil which is the trade name of phenelzine 50 mg p.o. q.i.d.  The      patient reported that his medications recently were changed about 2      weeks ago with increased dose of Nardil and addition of lorazepam.   PHYSICAL EXAMINATION:  GENERAL:  The patient is in no acute distress.  He is  well dressed.  He is also oriented x 3.  VITAL SIGNS:  Temperature 96.7, heart rate 54-65, respiratory rate 16, blood  pressure initially was 86/57, increased to 190/87 after IV fluids and then  decreased again.  Weight is 193 pounds.  HEENT:  The head is normocephalic, atraumatic.  Pupils equal, round, and  reactive to light and accommodation.  NECK:  No lymphadenopathy, no thyromegaly, no carotid bruits.  CHEST:  Clear to auscultation bilaterally.  No  wheezes, rales, or rhonchi.  HEART:  Regular rate and rhythm.  No murmurs.  ABDOMEN:  Soft, nontender, nondistended, decreased bowel sounds.  EXTREMITIES:  No cyanosis, edema, or clubbing.  NEUROLOGIC EXAM:  Grossly nonfocal.   STUDIES:  CBC was all normal.  Electrolytes were all within normal limits.  There was a mildly elevated glucose of 122.  Liver functions were within  normal.  Alcohol undetected.  UA was negative.  Urine drug screen was  negative.  Head CT was normal.   ASSESSMENT AND PLAN:  1.  Orthostatic hypotension.  This is most likely secondary to decreased      oral intake.  Also, a major contributing factor is some blockade coming      from the high dose Nardil.  Last item on the differential diagnosis is      presyncopal episodes associated with arrhythmia or cardiac event.  I am      going to admit patient to telemetry, give him some IV fluids, decrease      the dose of Nardil by 25%, hold lorazepam, rule out myocardial      infarction with cardiac enzymes, and start patient on low dose aspirin       81 mg, as I think the benefit outweigh risk at this point.  2.  Increased depression.  With the patient being on Nardil, which is known      for complicated drug interactions, I am uncomfortable adjusting or      starting new medication for this issue.  Going to consult Dr. Milford Cage, who is his primary psychiatrist, for follow up of this issue.  3.  Type 2 diabetes.  The patient was given diabetic diet.  Accu-Checks      q.a.c. and q.h.s. will be done, and the patient will be on a sliding-      scale in addition to continuation of Actos.  4.  High cholesterol.  We will continue Lipitor.      SA/MEDQ  D:  10/18/2004  T:  10/18/2004  Job:  [id number]   cc:   Jasmine Pang, M.D.  Fax: [phone]   Wilson Singer, M.D.  [address]  [place]  [place] [address]  Fax: [phone]

## 2010-11-15 NOTE — Discharge Summary (Signed)
NAMEKOSTAS, MARROW NO.:  [id number]   MEDICAL RECORD NO.:  [id number]                   PATIENT TYPE:  IPS   LOCATION:  0400                                 FACILITY:  [facility]   PHYSICIAN:  Geoffery Lyons, M.D.                   DATE OF BIRTH:  June 04, 1933   DATE OF ADMISSION:  03/22/2003  DATE OF DISCHARGE:  03/26/2003                                 DISCHARGE SUMMARY   CHIEF COMPLAINT AND PRESENT ILLNESS:  This was the first admission to [facility] for this 77 year old Hispanic male involuntarily  comitted.  He is a retired professor from [employer] who was petitioned by  Therapist, sports for continuing behavior, pressured speech, and manic state.  He  was started on Provigil 2 months ago to help with depressive episodes.  He  denied racing thoughts.  He claimed no history of impulsive behavior.   PAST PSYCHIATRIC HISTORY:  First time of inpatient treatment.  Seeing Leone [facility].  Had been on Nardil for 15 years, at one time he was taking up  to 5 tablets, that is 75 mg per day.  He saw Dr. Berna Spare in the past and Dr.  Claudette Head.   ALCOHOL AND DRUG HISTORY:  Denies the use of any abusive symptoms.   PAST MEDICAL HISTORY:  Esophageal reflux.   MEDICATIONS:  Nardil 15 mg 4 a day.   PHYSICAL EXAMINATION:  Performed and failed show any acute findings.   MENTAL STATUS EXAM:  Reveals a fairly alert, pleasant, polite, cooperative  male.  Speech has some pressure.  Mood indignant and embarrassed about being  here.  Affect is very upset and angry for being in the unit.  Thought  processes were logical, goal-directed.  No loose associations.  Basically  focus is on why he is on the unit.  Very upset because his psychiatrist  comitted him with no substantial evidence that he needed to be comitted.  He  denied any suicidal ideation or homicidal ideation.  Cognition well  preserved.   ADMISSION DIAGNOSIS:   AXIS I:  1. Bipolar  disorder.  2. Hypomanic.   AXIS II:  No diagnosis.   AXIS III:  Gastroesophageal reflux disease   AXIS IV:  Moderate.   AXIS V:  1. Global assessment of functioning upon admission:  35  2. Highest global assessment of functioning in the last year:  75.   HOSPITAL COURSE:  He was admitted and started in intensive individual and  group psychotherapy.  He was maintained on Nardil 15 mg 4 in the morning.  Initially, he was placed on the Provigil.  As there was a possibility that  Provigil was the cause for these mood fluctuations, we went ahead and  discontinued the Provigil and decreased the Nardil to 15 mg 3 in the  morning.  He was given some Risperdal 0.25 at bedtime and then Risperdal  was  increased to 0.5 at night.  He continued to be very upset for being in the  unit.  He started settling down.  He felt that the Risperdal was helping to  sleep.  He was not as anxious and agitated.  He still would like to have  some sort of clarification why his psychiatrist felt that he needed to be  admitted.  He felt that he could not trust her anymore, and he was  requesting referral to another psychiatrist.   On September 26, he continued to see no reason why he was being admitted and  actually there was no evidence of danger to self and others.  He claimed  that he had not done anything outside of the hospital to prove that he was a  danger.  He claimed that he would not hurt others, he would not act  impulsively.  He did not have a history of doing that.  He wanted to be  discharged.  He was willing to take the medication and comply with  outpatient followup.  As he was definitely stable enough that he could be  handled on an outpatient basis, there was no evidence of danger to self or  others, no agitation, there was compliance with treatment while in the unit,  we went ahead and requested a session with his wife and after the session,  we went ahead and discharged to outpatient  treatment.   DISCHARGE DIAGNOSIS:   AXIS I:  Bipolar disorder, manic.   AXIS II:  No diagnosis.   AXIS III:  Gastroesophageal reflux disease.   AXIS IV:  Moderate.   AXIS V:  Global assessment of functioning on discharge:  50.   DISCHARGE MEDICATIONS:  1. Nardil 15 mg 3 in the morning.  2. Risperdal 0.5 at bedtime.  3. Ativan 0.5 every 6 hours as needed for anxiety.  4. He was instructed not to take any more Provigil.   FOLLOW UP:  [facility] IOP starting March 27, 2003.                                               Geoffery Lyons, M.D.    IL/MEDQ  D:  04/19/2003  T:  04/21/2003  Job:  [id number]

## 2010-11-15 NOTE — Discharge Summary (Signed)
NAMESAVON, BORDONARO NO.:  [id number]   MEDICAL RECORD NO.:  [id number]          PATIENT TYPE:  INP   LOCATION:  0347                         FACILITY:  [facility]   PHYSICIAN:  Gertha Calkin, M.D.DATE OF BIRTH:  [birth year]   DATE OF ADMISSION:  10/18/2004  DATE OF DISCHARGE:  10/19/2004                                 DISCHARGE SUMMARY   PRIMARY CARE PHYSICIAN:  Wilson Singer, M.D.   PRIMARY PSYCHIATRIST:  Jasmine Pang, M.D.   DISCHARGE DIAGNOSES:  1.  Hypotension/orthostasis.  2.  Bipolar disorder.  3.  Type 2 diabetes.  4.  Hypercholesterolemia.   DISCHARGE MEDICATIONS:  1.  Change in medicine was Nardil from 15 mg p.o. q.i.d. to 15 mg p.o.      t.i.d.    Otherwise resume his other medications which include:  1.  Lipitor 10 mg p.o. daily.  2.  Ativan 0.25 mg p.o. t.i.d.  3.  Actos 30 mg p.o. daily.   HOSPITAL COURSE:  1.  Please see H&P for details of admission.  Orthostasis/hypertension.  Most likely multifactorial, secondary to  decreased p.o. intake from increase of depression and possibly also a side  effect from the Nardil.  Plan is to send him home on slightly lower doses.  At this point he needs to be resumed on another antidepressant and then  consider blood pressure meds if that is an issue in outpatient setting.   1.  Other background medical issues.  No increases or changes in medications      or doses.   DISPOSITION:  Stable.  Had no telometry events overnight.  Cardiac enzymes  were negative.  His vitals were stable.  TSH is 1.096 which is within normal  limits.  He is being discharged home to follow up with his psychiatrist to  further titrate or use ultrasound as combinations for his bipolar disorder  and then to his primary care physician for blood pressure medications if  needed.      JD/MEDQ  D:  10/19/2004  T:  10/19/2004  Job:  [id number]   cc:   Wilson Singer, M.D.  [address]  [place]  [place]  [address]  Fax: [phone]   Jasmine Pang, M.D.  Fax: [phone]

## 2010-11-15 NOTE — H&P (Signed)
NAMEOLAWALE, MARNEY NO.:  [id number]   MEDICAL RECORD NO.:  [id number]          PATIENT TYPE:  EMS   LOCATION:  ED                           FACILITY:  [facility]   PHYSICIAN:  Elliot Cousin, M.D.    DATE OF BIRTH:  11/16/1932   DATE OF ADMISSION:  12/07/2004  DATE OF DISCHARGE:                                HISTORY & PHYSICAL   PRIMARY CARE PHYSICIAN:  Wilson Singer, M.D.   PRIMARY PSYCHIATRIST:  Jasmine Pang, M.D.   CHIEF COMPLAINT:  Falls, loss of balance, lightheadedness.   HISTORY OF PRESENT ILLNESS:  The patient is a 77 year old Hispanic man with  a past medical history significant for bipolar disorder, hyperlipidemia, and  type 2 diabetes mellitus, who presents to the emergency department after  falling twice this morning.  The patient was actually admitted, in April  2006, for lightheadedness and orthostatic hypotension.  The patient's  presentation today is very similar to his presentation in April.  During the  hospitalization in April, the patient's cardiac enzymes were negative and  his TSH was within normal limits at 1.096.  The orthostatic hypotension was  thought to be secondary to Nardil.  At the time of hospital discharge, October 19, 2004, the patient was advised to decrease the Nardil to 45 mg daily,  rather than 60 mg daily that he had been prescribed by his psychiatrist, Dr.  Katrinka Blazing.  However, when the patient followed up with Dr. Katrinka Blazing one week ago,  the Nardil was increased again to 60 mg daily.  In addition, the patient was  started on lorazepam at 0.5 to 0.75 mg t.i.d. and trazodone 25 mg 1-2 pills  q.h.s. p.r.n.  The patient states that he fell once going to the bathroom  and once coming back from the bathroom.  Both times he urinated.  The  patient denied any preceding headache, visual changes, chest pain,  palpitations, or shortness of breath.  He feels that he lost his balance. He  also complains of transient lightheadedness.   The patient denies head  trauma.  He denies any nausea, vomiting, or diarrhea.  His appetite and  fluid intake have both been fair.  The patient has chronic depression and  states that his depression is no worse than usual.  The patient was actually seen and evaluated by a physician at a local urgent  care.  When the patient was found to be hypotensive, he was advised to come  to the emergency department.  When the patient arrived to the emergency  department, his sitting blood pressure was 82/59.  When he lay flat, his  blood pressure was 127/68, and when he stood up his blood pressure was  72/39.  The patient will, therefore, be admitted for further evaluation and  management.  The patient has no recent history of low blood sugars.  When he  arrived to the emergency department his blood glucose was 113.   PAST MEDICAL HISTORY:  1.  Admission, April 2006, secondary to hypotension (orthostatic) secondary      to Nardil and poor p.o. intake with depression.  During the admission,      the patient's TSH was within normal limits and his cardiac enzymes were      negative.  2.  Bipolar disorder with a history of involuntary commitment to behavioral      health in September 2004.  3.  Hyperlipidemia.  4.  Type 2 diabetes mellitus.  5.  Gastroesophageal reflux disease.  6.  Status post right hand surgery in the past.  7.  Status post right inguinal hernia repair in the past.   MEDICATIONS:  1.  Nardil 15 mg four pills daily.  2.  Lorazepam 0.5 to 0.75 mg t.i.d.  3.  Lipitor 20 mg q.h.s.  4.  Actos 30 mg every day.  5.  Trazodone 25 mg 1-2 tablets q.h.s. p.r.n.   ALLERGIES:  No known drug allergies.   SOCIAL HISTORY:  The patient is divorced.  [place] [employer].  He has two children,  one son who lives in [place] and one daughter who lives in [place].  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  He denies  alcohol, tobacco, and illicit drug use.  He is fairly independent.  He still  drives.   FAMILY HISTORY:  His father died of colon cancer.  He was in his mid 62s.  His mother died at 90-years-of-age secondary to old age.   REVIEW OF SYSTEMS:  The patient's review of systems is positive for  occasional depression manifested with increase in sleepiness, decrease in  appetite, loss of interest in activities.  The patient denies suicidal  ideation.  Otherwise his review of systems has been negative.   PHYSICAL EXAMINATION:  VITAL SIGNS:  Temperature 96.9, supine blood pressure  127/68, standing blood pressure 74/46, supine pulse 53, standing pulse 65,  respiratory rate 18, oxygen saturation 96% on room air.  GENERAL:  The patient is a pleasant, mildly overweight, alert, Hispanic, 77-  year-old man who is currently sitting up in bed in no acute distress.  HEENT:  Head is normocephalic, nontraumatic.  Pupils are equal, round, and  reactive to light.  Extraocular movements are intact.  Conjunctivae are  clear.  Sclerae are white.  Tympanic membranes are clear bilaterally.  Nasal  mucosa is mildly dry.  No drainage.  No sinus tenderness.  Oropharynx  reveals dry mucous membranes.  No posterior exudate or erythema.  His teeth  are in fair repair.  NECK:  Supple.  No adenopathy.  No thyromegaly.  No bruit.  No JVD.  LUNGS:  Clear to auscultation with the exception of decreased breath sounds  in the bases.  HEART:  Distant S1 S2 with mild bradycardia.  No murmurs, rubs, or gallops.  ABDOMEN:  Mildly obese.  Positive bowel sounds.  Soft, nontender,  nondistended.  No hepatosplenomegaly.  No masses palpated.  GU:  Deferred.  RECTAL:  Deferred.  EXTREMITIES:  The patient has a good range of motion of all of his  extremities.  No acute joint abnormalities.  Pedal pulses barely palpable.  No pretibial edema.  No pedal edema. NEUROLOGIC:  The patient is alert and oriented x 3.   Cranial nerves II-XII  are intact.  Strength is 5/5 throughout.  Sensation is intact to soft touch.  Plantar reflexes are downgoing.  Cerebellar with finger-to-nose bilaterally  is intact.  Gait was not assessed.  The patient had no evidence of  nystagmus.   ADMISSION  LABORATORIES:  EKG reveals sinus bradycardia with a heart rate of  49 beats per minute.  WBC 5.5, hemoglobin 14.1, hematocrit 41.5, MCV 87.0,  platelets 195.  CK-MB less than 1.0, troponin I less than 0.05, myoglobin  143.  Sodium 142, potassium 4.3, chloride 108, CO2 29, glucose 111, BUN 22,  creatinine 1.2, calcium 9.5.   ASSESSMENT:  1.  Fall secondary to loss of balance and lightheadedness.  Evidence of      orthostatic hypotension.  The patient's presentation is very similar to      the presentation in April 2006.  During the admission in April, the      workup was essentially negative.  The patient's CT scan of the head in      April was negative.  His TSH was within normal limits and the cardiac      enzymes were negative.  It appears more than likely, the patient's      orthostatic hypotension is again secondary to Nardil coupled by new      medications, trazodone and lorazepam.  The patient's liver transaminases      and renal function appear to be within normal limits, however, given the      patient's age, he may not be metabolizing these medications as quickly      and as effectively as he had been in the past.  The patient is      profoundly orthostatic hypotensive.  It would be reasonable to decrease      the dose of Nardil and Trazodone to avoid potential life-threatening      injury.  The lorazepam will be held for now.  The Nardil will be      decreased to b.i.d. and the Trazodone will be decreased to 12.5 mg      q.h.s. p.r.n.  2.  Sinus bradycardia.  The patient appears to have chronic bradycardia,      although not profound bradycardia.  He has not been treated with a beta-      blocker or calcium  channel blocker in the past.  The patient will be      monitored on telemetry for evaluation of further arrhythmias.  His TSH      was within normal limits approximately six weeks ago.  The patient may      need further evaluation with a 2D echocardiogram.  We will also consider      a cardiology consult.  3.  Type 2 diabetes mellitus.  The patient has had no recent experiences      with hypoglycemia, per his account.   PLAN:  1.  The patient will be admitted to a telemetry bed for further evaluation.  2.  We will check an EKG in the morning.  3.  We will check a baseline chest x-ray.  4.  We will order a 2D echocardiogram to evaluate/assess for valvular      abnormalities and LV/RV function.  5.  We will also check cardiac enzymes q.8h. x 2.  6.  As stated above, the Nardil and Trazodone doses will be decreased.      Lorazepam will be withheld for now.  7.  The patient's capillary blood sugars will be monitored q.a.c. and q.h.s. 8.  The Actos will continue but will be held for capillary blood sugar less      than 100.  9.  The patient was given 1liter of normal saline in the ED.  IV fluid  therapy will continue with normal saline at 100 cc/hr.  10. We will check orthostatic blood pressure and heart rate each morning x      2.  Adjust IV fluids as needed.  11. We will hold on imaging study of the head/brain given that the patient      has no obvious neurological findings on exam; however, we will consider      MRI of the brain if the patient becomes more symptomatic.  The plausible      etiology of the patient's falls is orthostatic hypotension secondary to      Nardil.       DF/MEDQ  D:  12/07/2004  T:  12/07/2004  Job:  [id number]   cc:   Wilson Singer, M.D.  [address]  [place]  [place] [address]  Fax: [phone]   Jasmine Pang, M.D.  Fax: [phone]

## 2010-11-20 ENCOUNTER — Other Ambulatory Visit: Payer: Self-pay | Admitting: Internal Medicine

## 2010-11-20 DIAGNOSIS — M79606 Pain in leg, unspecified: Secondary | ICD-10-CM

## 2010-11-20 DIAGNOSIS — E119 Type 2 diabetes mellitus without complications: Secondary | ICD-10-CM

## 2010-11-22 ENCOUNTER — Ambulatory Visit
Admission: RE | Admit: 2010-11-22 | Discharge: 2010-11-22 | Disposition: A | Discharge status: Home / Self Care | Payer: MEDICARE | Source: Ambulatory Visit | Attending: Internal Medicine | Admitting: Internal Medicine

## 2010-11-22 DIAGNOSIS — M79606 Pain in leg, unspecified: Secondary | ICD-10-CM

## 2010-11-22 DIAGNOSIS — E119 Type 2 diabetes mellitus without complications: Secondary | ICD-10-CM

## 2011-04-11 LAB — CBC
MCV: 84.4
RBC: 4.36
WBC: 6

## 2011-04-11 LAB — BASIC METABOLIC PANEL
Chloride: 106
Creatinine, Ser: 1.16
GFR calc Af Amer: >60
GFR calc non Af Amer: >60
Potassium: 4.7

## 2011-04-11 LAB — DIFFERENTIAL
Eosinophils Absolute: 0.2
Lymphocytes Relative: 16
Lymphs Abs: 0.9
Monocytes Relative: 7
Neutro Abs: 4.4
Neutrophils Relative %: 74

## 2011-04-11 LAB — URINALYSIS, ROUTINE W REFLEX MICROSCOPIC
Glucose, UA: NEGATIVE
Hgb urine dipstick: NEGATIVE
Specific Gravity, Urine: 1.023
pH: 5.5

## 2011-06-25 ENCOUNTER — Encounter: Payer: Self-pay | Admitting: Emergency Medicine

## 2011-06-25 ENCOUNTER — Emergency Department (HOSPITAL_COMMUNITY)
Admission: EM | Admit: 2011-06-25 | Discharge: 2011-06-26 | Disposition: A | Discharge status: Home / Self Care | Payer: Medicare Other | Attending: Emergency Medicine | Admitting: Emergency Medicine

## 2011-06-25 ENCOUNTER — Emergency Department (HOSPITAL_COMMUNITY): Payer: Medicare Other

## 2011-06-25 DIAGNOSIS — R10819 Abdominal tenderness, unspecified site: Secondary | ICD-10-CM | POA: Insufficient documentation

## 2011-06-25 DIAGNOSIS — E119 Type 2 diabetes mellitus without complications: Secondary | ICD-10-CM | POA: Insufficient documentation

## 2011-06-25 DIAGNOSIS — R109 Unspecified abdominal pain: Secondary | ICD-10-CM | POA: Insufficient documentation

## 2011-06-25 DIAGNOSIS — Z79899 Other long term (current) drug therapy: Secondary | ICD-10-CM | POA: Insufficient documentation

## 2011-06-25 DIAGNOSIS — K5900 Constipation, unspecified: Secondary | ICD-10-CM | POA: Insufficient documentation

## 2011-06-25 HISTORY — DX: Major depressive disorder, single episode, unspecified: F32.9

## 2011-06-25 HISTORY — DX: Depression, unspecified: F32.A

## 2011-06-25 HISTORY — DX: Anxiety disorder, unspecified: F41.9

## 2011-06-25 LAB — CBC
HCT: 47.2 % (ref 39.0–52.0)
Hemoglobin: 16.2 g/dL (ref 13.0–17.0)
MCV: 89.1 fL (ref 78.0–100.0)
RBC: 5.3 MIL/uL (ref 4.22–5.81)
WBC: 10.6 10*3/uL — ABNORMAL HIGH (ref 4.0–10.5)

## 2011-06-25 LAB — COMPREHENSIVE METABOLIC PANEL
Albumin: 4.1 g/dL (ref 3.5–5.2)
Alkaline Phosphatase: 88 U/L (ref 39–117)
BUN: 21 mg/dL (ref 6–23)
Chloride: 100 mEq/L (ref 96–112)
Creatinine, Ser: 1.13 mg/dL (ref 0.50–1.35)
GFR calc Af Amer: 71 mL/min — ABNORMAL LOW (ref >90)
GFR calc non Af Amer: 61 mL/min — ABNORMAL LOW (ref >90)
Glucose, Bld: 148 mg/dL — ABNORMAL HIGH (ref 70–99)
Potassium: 4.4 mEq/L (ref 3.5–5.1)
Total Bilirubin: 0.3 mg/dL (ref 0.3–1.2)

## 2011-06-25 LAB — DIFFERENTIAL
Basophils Relative: 0 % (ref 0–1)
Lymphs Abs: 1.4 10*3/uL (ref 0.7–4.0)
Monocytes Absolute: 0.8 10*3/uL (ref 0.1–1.0)
Monocytes Relative: 7 % (ref 3–12)
Neutro Abs: 8.3 10*3/uL — ABNORMAL HIGH (ref 1.7–7.7)

## 2011-06-25 LAB — LIPASE, BLOOD: Lipase: 26 U/L (ref 11–59)

## 2011-06-25 MED ORDER — SODIUM CHLORIDE 0.9 % IV BOLUS (SEPSIS)
500.0000 mL | Freq: Once | INTRAVENOUS | Status: AC
  Administered 2011-06-25: 500 mL via INTRAVENOUS

## 2011-06-25 NOTE — ED Notes (Signed)
Pt is also c/o of a soar throat

## 2011-06-25 NOTE — ED Notes (Signed)
Pt alert, nad, c/o llq abd pain, onset this evening, denies n/v, states mild constipation, ambulates to room, skin pwd

## 2011-06-25 NOTE — ED Provider Notes (Signed)
History     CSN: [id number]  Arrival date & time 06/25/11  9147   First MD Initiated Contact with Patient 06/25/11 2045      Chief Complaint  Patient presents with  . Abdominal Pain     HPI   History provided by the patient. Patient is a 77 year old male with history of diabetes who presents with complaints of constipation and acute onset of upper abdominal pains around 4 PM today. Pain is intermittent and described as sharp and cramping. Patient states pain is very severe when it comes on. Currently patient denies pain. he also reports associated abdominal distention. He denies any nausea or vomiting. Patient has tried taking an acids without significant improvement. Patient reports last bowel movement was 2 days ago and was very large and hard with a lot of straining. Patient denies fever, chills, sweats. Patient denies any other significant past medical history.   Past Medical History  Diagnosis Date  . Diabetes mellitus   . Depression   . Anxiety     Past Surgical History  Procedure Date  . Fracture surgery   . Hernia repair     No family history on file.  History  Substance Use Topics  . Smoking status: Never Smoker   . Smokeless tobacco: Not on file  . Alcohol Use: No      Review of Systems  Constitutional: Negative for fever, chills and appetite change.  Respiratory: Negative for cough and shortness of breath.   Cardiovascular: Negative for chest pain.  Gastrointestinal: Positive for abdominal pain and constipation. Negative for nausea, vomiting and diarrhea.  Genitourinary: Negative for dysuria, hematuria and flank pain.  All other systems reviewed and are negative.    Allergies  Review of patient's allergies indicates no known allergies.  Home Medications   Current Outpatient Rx  Name Route Sig Dispense Refill  . ATORVASTATIN CALCIUM 20 MG PO TABS Oral Take 20 mg by mouth daily.      . BUPROPION HCL ER (XL) 150 MG PO TB24 Oral Take 300 mg by  mouth daily.      Marland Kitchen CALCIUM + D PO Oral Take 1 tablet by mouth daily.      . OMEGA-3 FATTY ACIDS 1000 MG PO CAPS Oral Take 1 g by mouth daily.      Marland Kitchen FOLIC ACID 1 MG PO TABS Oral Take 1 mg by mouth daily.      . L-METHYLFOLATE-B6-B12 3-35-2 MG PO TABS Oral Take 1 tablet by mouth 2 (two) times daily.      Marland Kitchen LISINOPRIL 10 MG PO TABS Oral Take 10 mg by mouth daily.      Marland Kitchen METHOTREXATE 2.5 MG PO TABS Oral Take 7.5 mg by mouth once a week. On Fridays.  Caution:Chemotherapy. Protect from light.     . ADULT MULTIVITAMIN W/MINERALS CH Oral Take 1 tablet by mouth daily.      . ICAPS PO Oral Take 1 capsule by mouth daily.      Marland Kitchen SITAGLIPTIN PHOSPHATE 100 MG PO TABS Oral Take 100 mg by mouth daily.      Marland Kitchen TAMSULOSIN HCL 0.4 MG PO CAPS Oral Take 0.8 mg by mouth daily.      Marland Kitchen TEMAZEPAM 15 MG PO CAPS Oral Take 45 mg by mouth at bedtime. For sleep.       BP 126/79  Pulse 104  Temp(Src) 98 F (36.7 C) (Oral)  Resp 16  Wt 165 lb (74.844 kg)  SpO2 99%  Physical  Exam  Nursing note and vitals reviewed. Constitutional: He is oriented to person, place, and time. He appears well-developed and well-nourished.  HENT:  Head: Normocephalic and atraumatic.  Mouth/Throat: Oropharynx is clear and moist.  Cardiovascular: Normal rate and regular rhythm.   Pulmonary/Chest: Effort normal and breath sounds normal.  Abdominal: Soft. Normal appearance. Bowel sounds are increased. There is no rebound, no guarding, no CVA tenderness, no tenderness at McBurney's point and negative Murphy's sign.       Mild distention. Mild diffuse tenderness  Genitourinary:       Normal rectal exam. No fecal impaction. No gross blood. Stool normal color.  Neurological: He is alert and oriented to person, place, and time.  Skin: Skin is warm. No rash noted.  Psychiatric: He has a normal mood and affect. His behavior is normal.    ED Course  Procedures (including critical care time)   Labs Reviewed  CBC  DIFFERENTIAL    COMPREHENSIVE METABOLIC PANEL  LIPASE, BLOOD   Results for orders placed during the hospital encounter of 06/25/11  CBC      Component Value Range   WBC 10.6 (*) 4.0 - 10.5 (K/uL)   RBC 5.30  4.22 - 5.81 (MIL/uL)   Hemoglobin 16.2  13.0 - 17.0 (g/dL)   HCT 96.0  45.4 - 09.8 (%)   MCV 89.1  78.0 - 100.0 (fL)   MCH 30.6  26.0 - 34.0 (pg)   MCHC 34.3  30.0 - 36.0 (g/dL)   RDW 11.9  14.7 - 82.9 (%)   Platelets 218  150 - 400 (K/uL)  DIFFERENTIAL      Component Value Range   Neutrophils Relative 78 (*) 43 - 77 (%)   Neutro Abs 8.3 (*) 1.7 - 7.7 (K/uL)   Lymphocytes Relative 13  12 - 46 (%)   Lymphs Abs 1.4  0.7 - 4.0 (K/uL)   Monocytes Relative 7  3 - 12 (%)   Monocytes Absolute 0.8  0.1 - 1.0 (K/uL)   Eosinophils Relative 2  0 - 5 (%)   Eosinophils Absolute 0.2  0.0 - 0.7 (K/uL)   Basophils Relative 0  0 - 1 (%)   Basophils Absolute 0.0  0.0 - 0.1 (K/uL)  COMPREHENSIVE METABOLIC PANEL      Component Value Range   Sodium 137  135 - 145 (mEq/L)   Potassium 4.4  3.5 - 5.1 (mEq/L)   Chloride 100  96 - 112 (mEq/L)   CO2 23  19 - 32 (mEq/L)   Glucose, Bld 148 (*) 70 - 99 (mg/dL)   BUN 21  6 - 23 (mg/dL)   Creatinine, Ser 5.62  0.50 - 1.35 (mg/dL)   Calcium 13.0  8.4 - 10.5 (mg/dL)   Total Protein 7.4  6.0 - 8.3 (g/dL)   Albumin 4.1  3.5 - 5.2 (g/dL)   AST 33  0 - 37 (U/L)   ALT 34  0 - 53 (U/L)   Alkaline Phosphatase 88  39 - 117 (U/L)   Total Bilirubin 0.3  0.3 - 1.2 (mg/dL)   GFR calc non Af Amer 61 (*) >90 (mL/min)   GFR calc Af Amer 71 (*) >90 (mL/min)  LIPASE, BLOOD      Component Value Range   Lipase 26  11 - 59 (U/L)     Ct Abdomen Pelvis W Contrast  06/26/2011  *RADIOLOGY REPORT*  Clinical Data: Left lower quadrant pain.  Constipation.  History of prostate cancer.  CT ABDOMEN AND PELVIS  WITH CONTRAST  Technique:  Multidetector CT imaging of the abdomen and pelvis was performed following the standard protocol during bolus administration of intravenous contrast.   Contrast: OMNIPAQUE IOHEXOL 300 MG/ML IV SOLN  Comparison: 02/07/2010.  Findings: Minimal scarring lung bases.  Abnormal appearance of small bowel loops which are fluid-filled and dilated.  Point of obstruction is not identified.  This appearance may be related to a partial small bowel obstruction from adhesions. There is a change caliber small bowel loops which are of normal caliber distally.  Small amount of free fluid perihepatic region, right lower abdomen and within the pelvis.  Fluid filled colon with moderate amount of stool in the rectosigmoid region without  colonic inflammatory process noted. No inflammation surrounds the appendix.  Prostatic seed implants are in place.  Noncontrast filled views of the urinary bladder unremarkable.  Calcification within the right aspect of the pelvis of questionable etiology.  No pelvic or abdominal adenopathy.  No focal hepatic, splenic, adrenal or pancreatic lesion.  Left renal cysts with tiny right renal cyst suspected.  Atherosclerotic type changes of the coronary arteries, aorta and aortic branch vessels.  Mild ectasia of the lower abdominal aorta and the proximal common iliac arteries without focal aneurysm.  No bony destructive lesion or sclerotic focus.  IMPRESSION: Abnormal appearance of small bowel raising possibility of partial small bowel obstruction which may be related to adhesions.  Small amount free fluid is seen in the abdomen and pelvis.  Atherosclerotic type changes aorta, aortic branch vessels and coronary arteries.  Please see above.  Original Report Authenticated By: Fuller Canada, M.D.     1. Constipation   2. Abdominal pain       MDM  9:45 PM patient seen and evaluated. Patient in no acute distress.    1:00 AM patient has remained comfortable while in ED. He he denies any abdominal pains at this time. Patient does state that he had a large bowel movement after returning from CAT scan. Patient has had no symptoms of nausea and  vomiting. Patient's abdomen is soft nontender on reexam. There are no clinical signs for small bowel obstruction. At this time suspect patient's symptoms are from constipation. Plan to provide patient with prescription for a stool softener and 24 hour recheck of symptoms. Patient agrees with this plan and is ready to return home.     Angus Seller, PA 06/26/11 [phone]

## 2011-06-26 MED ORDER — IOHEXOL 300 MG/ML  SOLN
100.0000 mL | Freq: Once | INTRAMUSCULAR | Status: AC | PRN
  Administered 2011-06-26: 100 mL via INTRAVENOUS

## 2011-06-26 MED ORDER — POLYETHYLENE GLYCOL 3350 17 GM/SCOOP PO POWD: 17.0000 g | Freq: Every day | ORAL | Status: AC

## 2011-06-26 NOTE — ED Provider Notes (Signed)
Medical screening examination/treatment/procedure(s) were performed by non-physician practitioner and as supervising physician I was immediately available for consultation/collaboration.  Flint Melter, MD 06/26/11 1226

## 2011-08-04 ENCOUNTER — Ambulatory Visit (INDEPENDENT_AMBULATORY_CARE_PROVIDER_SITE_OTHER): Payer: Medicare Other | Admitting: Family Medicine

## 2011-08-04 VITALS — BP 155/71 | HR 86 | Temp 98.0°F | Resp 16 | Ht 67.75 in | Wt 164.2 lb

## 2011-08-04 DIAGNOSIS — B880 Other acariasis: Secondary | ICD-10-CM

## 2011-08-04 DIAGNOSIS — R6883 Chills (without fever): Secondary | ICD-10-CM

## 2011-08-04 DIAGNOSIS — G44209 Tension-type headache, unspecified, not intractable: Secondary | ICD-10-CM

## 2011-08-04 LAB — POCT UA - MICROSCOPIC ONLY
Bacteria, U Microscopic: NEGATIVE
Casts, Ur, LPF, POC: NEGATIVE
Crystals, Ur, HPF, POC: NEGATIVE
Epithelial cells, urine per micros: NEGATIVE
Mucus, UA: NEGATIVE
RBC, urine, microscopic: NEGATIVE

## 2011-08-04 LAB — POCT CBC
Granulocyte percent: 87.4 %G — AB (ref 37–80)
HCT, POC: 43.7 % (ref 43.5–53.7)
Hemoglobin: 14.1 g/dL (ref 14.1–18.1)
MCH, POC: 29.5 pg (ref 27–31.2)
MCV: 91.5 fL (ref 80–97)
MID (cbc): 0.6 (ref 0–0.9)
Platelet Count, POC: 199 10*3/uL (ref 142–424)
RBC: 4.78 M/uL (ref 4.69–6.13)
WBC: 10.6 10*3/uL — AB (ref 4.6–10.2)

## 2011-08-04 LAB — POCT URINALYSIS DIPSTICK
Bilirubin, UA: NEGATIVE
Ketones, UA: 40
Nitrite, UA: NEGATIVE
Spec Grav, UA: >=1.03
Urobilinogen, UA: 0.2
pH, UA: 5.5

## 2011-08-04 NOTE — Progress Notes (Signed)
I. the evaluated the patient with the the PA Erin. The patient is mildly tremulous at the time I'm seeing him. We reviewed the labs. There is no clear-cut diagnosis for this problem, but suspect that he has some early internal syndrome. If he is not improving substantially he needs to get rechecked in the next day or 2.

## 2011-08-04 NOTE — Progress Notes (Signed)
  Subjective:    Patient ID: [name], male    DOB: June 25, 1933, 78 y.o.   MRN: [id number]  HPI 78 y/o male with history of HTN, HL, DM, BPH, all controlled with medications, presents with acute onset of frontal headache, clear rhinorrhea and shaking chills that began today after lunch.  He denies associated dizziness, blurred vision (although he does have macular degeneration OD), nausea, vomiting, myalgias, cough or sore throat.     Review of Systems  Constitutional: Positive for chills. Negative for fever and fatigue.  HENT: Positive for rhinorrhea (clear, when leaning forward). Negative for ear pain, nosebleeds, sneezing, neck stiffness, postnasal drip and tinnitus.   Eyes: Negative.   Respiratory: Negative for cough, shortness of breath and wheezing.   Cardiovascular: Negative for chest pain.  Gastrointestinal: Negative for nausea, vomiting, abdominal pain and diarrhea.  Musculoskeletal: Negative for myalgias and arthralgias.       Objective:   Physical Exam  Constitutional: He is oriented to person, place, and time. He appears well-developed and well-nourished.  HENT:  Head: Normocephalic and atraumatic.  Right Ear: External ear normal.  Left Ear: External ear normal.  Neck: Normal range of motion. Neck supple.  Cardiovascular: Normal rate and regular rhythm.  Exam reveals no gallop and no friction rub.   No murmur heard. Pulmonary/Chest: Effort normal. No respiratory distress. He has no wheezes. He has no rales.  Abdominal: Soft. Bowel sounds are normal. There is no tenderness.  Lymphadenopathy:    He has no cervical adenopathy.  Neurological: He is alert and oriented to person, place, and time.  Skin: Skin is warm and dry.  Psychiatric: His mood appears anxious. Cognition and memory are normal.          Assessment & Plan:  Rhinorrhea, headache, chills. Likely early viral infection.  Normal exam without abnormal exam findings.  Normal labs.  Advised  him to take ibuprofen 400mg  qHS for headache, may use cetirizine for rhinorrhea.  If symptoms continue, he should call his PCP in the morning for repeat evaluation.

## 2011-08-04 NOTE — Patient Instructions (Signed)
Upper Respiratory Infection, Adult An upper respiratory infection (URI) is also known as the common cold. It is often caused by a type of germ (virus). Colds are easily spread (contagious). You can pass it to others by kissing, coughing, sneezing, or drinking out of the same glass. Usually, you get better in 1 or 2 weeks.  HOME CARE   Only take medicine as told by your doctor.   Use a warm mist humidifier or breathe in steam from a hot shower.   Drink enough water and fluids to keep your pee (urine) clear or pale yellow.   Get plenty of rest.   Return to work when your temperature is back to normal or as told by your doctor. You may use a face mask and wash your hands to stop your cold from spreading.  GET HELP RIGHT AWAY IF:   After the first few days, you feel you are getting worse.   You have questions about your medicine.   You have chills, shortness of breath, or brown or red spit (mucus).   You have yellow or brown snot (nasal discharge) or pain in the face, especially when you bend forward.   You have a fever, puffy (swollen) neck, pain when you swallow, or white spots in the back of your throat.   You have a bad headache, ear pain, sinus pain, or chest pain.   You have a high-pitched whistling sound when you breathe in and out (wheezing).   You have a lasting cough or cough up blood.   You have sore muscles or a stiff neck.  MAKE SURE YOU:   Understand these instructions.   Will watch your condition.   Will get help right away if you are not doing well or get worse.  Document Released: 12/03/2007 Document Revised: 02/26/2011 Document Reviewed: 10/21/2010 [facility] Patient Information 2012 [place], [place].  Take ibuprofen 400mg  at night before bed for your headache and zyrtec 10mg  before bed for allergic symptoms.  If your symptoms don't improve tomorrow, or if they worsen, call your primary MD to discuss getting another appointment.

## 2011-08-13 ENCOUNTER — Ambulatory Visit (INDEPENDENT_AMBULATORY_CARE_PROVIDER_SITE_OTHER): Payer: Medicare Other | Admitting: Emergency Medicine

## 2011-08-13 VITALS — BP 141/75 | HR 78 | Temp 97.9°F | Resp 16 | Ht 66.75 in | Wt 163.4 lb

## 2011-08-13 DIAGNOSIS — J329 Chronic sinusitis, unspecified: Secondary | ICD-10-CM

## 2011-08-13 DIAGNOSIS — J029 Acute pharyngitis, unspecified: Secondary | ICD-10-CM

## 2011-08-13 DIAGNOSIS — J019 Acute sinusitis, unspecified: Secondary | ICD-10-CM

## 2011-08-13 DIAGNOSIS — J069 Acute upper respiratory infection, unspecified: Secondary | ICD-10-CM

## 2011-08-13 LAB — POCT RAPID STREP A (OFFICE): Rapid Strep A Screen: NEGATIVE

## 2011-08-13 MED ORDER — FLUTICASONE PROPIONATE 50 MCG/ACT NA SUSP: 2.0000 | Freq: Every day | NASAL | Status: DC

## 2011-08-13 MED ORDER — AMOXICILLIN 875 MG PO TABS: 875.0000 mg | ORAL_TABLET | Freq: Two times a day (BID) | ORAL | Status: AC

## 2011-08-13 NOTE — Patient Instructions (Signed)

## 2011-08-13 NOTE — Progress Notes (Signed)
  Subjective:    Patient ID: [name], male    DOB: 1933-03-18, 78 y.o.   MRN: [id number]  HPI patient enters with chief complaint of a persistent upper respiratory type infection. He was seen here initial evaluation revealed a normal blood count and chest x-ray. He was not treated with any medication but he returns today with persistent upper respiratory type congestion. He states he has not had any purulent drainage but has persistent runny nose. He denies fever and chills    Review of Systems     Objective:   Physical Exam physical exam reveals a clear rhinorrhea. Throat is red and inflamed. Chest is clear to auscultation and percussion.        Assessment & Plan:  Patient has a persistent upper respiratory problems he was treated initially with symptomatic care but continues to have trouble. We'll check a strep test and proceed from there.

## 2011-10-08 ENCOUNTER — Encounter: Payer: Self-pay | Admitting: *Deleted

## 2011-12-26 ENCOUNTER — Ambulatory Visit (INDEPENDENT_AMBULATORY_CARE_PROVIDER_SITE_OTHER): Payer: Medicare Other | Admitting: Family Medicine

## 2011-12-26 VITALS — BP 129/69 | HR 73 | Temp 98.0°F | Resp 20 | Ht 67.0 in | Wt 165.0 lb

## 2011-12-26 DIAGNOSIS — M549 Dorsalgia, unspecified: Secondary | ICD-10-CM

## 2011-12-26 DIAGNOSIS — K5289 Other specified noninfective gastroenteritis and colitis: Secondary | ICD-10-CM

## 2011-12-26 DIAGNOSIS — K529 Noninfective gastroenteritis and colitis, unspecified: Secondary | ICD-10-CM

## 2011-12-26 DIAGNOSIS — B370 Candidal stomatitis: Secondary | ICD-10-CM

## 2011-12-26 MED ORDER — FLUCONAZOLE 100 MG PO TABS: 100.0000 mg | ORAL_TABLET | Freq: Every day | ORAL | Status: AC

## 2011-12-26 MED ORDER — METRONIDAZOLE 250 MG PO TABS: 250.0000 mg | ORAL_TABLET | Freq: Three times a day (TID) | ORAL | Status: AC

## 2011-12-26 NOTE — Progress Notes (Signed)
78 yo Hispanic man with three problems: 1. Sore throat and hoarseness for over a month.  ENT evaluation was negative.  He was seen here and treated for acid reflux.  Food in mouth burns.  2. Back pain, 2 weeks, central lumbar.  Constant.  Worse with sitting or getting out of chair or bending sideways.  3. Alternating constipation and diarrhea.  Sometimes goes from one extreme to the other.  Tried imodium.  This has been a problem for 2 weeks.  No blood in stool.  Takes antacids intermittently for GERD.  No fevers.  No cough.  No abdominal cramps Checks sugar and the levels have been fine (Dr. Ludwig Clarks).  Hgb A1C 6.1 or so.  Objective:  No distress. Neuro:  Normal MS, CN III-Xii SLR: positive lying at 45 degrees Skin: clear Abdomen:  Soft, nontender, no HSM or mass Oroph: red Ent:  Some wax, otherwise negative Neck:  Normal  A:  Thrush, LBP, IBS  P:  Diflucan 100 qd x 7, Flagyl 250 mg bid x 7 Heat for the back or icy/hot

## 2012-03-19 ENCOUNTER — Encounter: Payer: Self-pay | Admitting: Gastroenterology

## 2012-03-19 ENCOUNTER — Ambulatory Visit (INDEPENDENT_AMBULATORY_CARE_PROVIDER_SITE_OTHER): Payer: Medicare Other | Admitting: Gastroenterology

## 2012-03-19 ENCOUNTER — Other Ambulatory Visit (INDEPENDENT_AMBULATORY_CARE_PROVIDER_SITE_OTHER): Payer: Medicare Other

## 2012-03-19 VITALS — BP 124/60 | HR 60 | Ht 67.0 in | Wt 167.0 lb

## 2012-03-19 DIAGNOSIS — J312 Chronic pharyngitis: Secondary | ICD-10-CM

## 2012-03-19 DIAGNOSIS — Z79899 Other long term (current) drug therapy: Secondary | ICD-10-CM

## 2012-03-19 DIAGNOSIS — R109 Unspecified abdominal pain: Secondary | ICD-10-CM

## 2012-03-19 DIAGNOSIS — F341 Dysthymic disorder: Secondary | ICD-10-CM

## 2012-03-19 DIAGNOSIS — K5730 Diverticulosis of large intestine without perforation or abscess without bleeding: Secondary | ICD-10-CM

## 2012-03-19 DIAGNOSIS — E119 Type 2 diabetes mellitus without complications: Secondary | ICD-10-CM

## 2012-03-19 DIAGNOSIS — R197 Diarrhea, unspecified: Secondary | ICD-10-CM

## 2012-03-19 DIAGNOSIS — H3530 Unspecified macular degeneration: Secondary | ICD-10-CM

## 2012-03-19 DIAGNOSIS — K6389 Other specified diseases of intestine: Secondary | ICD-10-CM

## 2012-03-19 DIAGNOSIS — F418 Other specified anxiety disorders: Secondary | ICD-10-CM

## 2012-03-19 LAB — CBC WITH DIFFERENTIAL/PLATELET
Basophils Relative: 0.2 % (ref 0.0–3.0)
Eosinophils Relative: 3.6 % (ref 0.0–5.0)
HCT: 43.1 % (ref 39.0–52.0)
Hemoglobin: 14.2 g/dL (ref 13.0–17.0)
Lymphs Abs: 0.8 10*3/uL (ref 0.7–4.0)
MCV: 91.1 fl (ref 78.0–100.0)
Monocytes Absolute: 0.4 10*3/uL (ref 0.1–1.0)
Neutro Abs: 3.7 10*3/uL (ref 1.4–7.7)
Platelets: 187 10*3/uL (ref 150.0–400.0)
RBC: 4.73 Mil/uL (ref 4.22–5.81)
WBC: 5.1 10*3/uL (ref 4.5–10.5)

## 2012-03-19 LAB — FOLATE: Folate: >24.8 ng/mL (ref >5.9)

## 2012-03-19 LAB — IBC PANEL: Transferrin: 216.3 mg/dL (ref 212.0–360.0)

## 2012-03-19 LAB — SEDIMENTATION RATE: Sed Rate: 6 mm/hr (ref 0–22)

## 2012-03-19 LAB — C-REACTIVE PROTEIN: CRP: <0.5 mg/dL (ref 0.5–20.0)

## 2012-03-19 MED ORDER — RIFAXIMIN 550 MG PO TABS: 550.0000 mg | ORAL_TABLET | Freq: Two times a day (BID) | ORAL | Status: DC

## 2012-03-19 NOTE — Patient Instructions (Addendum)
Your physician has requested that you go to the basement for the following lab work before leaving today. You have been scheduled for an endoscopy and colonoscopy with propofol. Please follow the written instructions given to you at your visit today. Please pick up your prep at the pharmacy within the next 1-3 days. If you use inhalers (even only as needed), please bring them with you on the day of your procedure. We have sent the following medications to your pharmacy for you to pick up at your convenience: Xifaxin. Take one pill twice a day for 10 days. You also need to take Align every day for 10 days. This puts good bacteria back into your colon. You should take 1 capsule by mouth once daily. If this works well for you, it can be purchased over the counter.  CC: Ralene Ok, M. D.

## 2012-03-19 NOTE — Progress Notes (Signed)
History of Present Illness:  This is a very complicated 78 year old Hispanic male with multiple medical problems. He has chronic hoarseness and throat pain of unexplained etiology with previous negative GI evaluations including 24 a pH probe testing with Bravo probe. He continues to complain of hoarseness and throat pain but no typical reflux symptoms. His other problem is chronic constipation followed by loose watery diarrhea over the last several years. Last colonoscopy in 2008 showed diverticulosis. He was recently seen by his local physician and treated with metronidazole for 1 weeks with some improvement in his GI complaints. For his throat pain he was prescribed fluconazole without improvement. The patient denies melena, hematochezia, or significant abdominal pain, anorexia, weight loss, specific food intolerances, and he does take daily fiber bars. Otherwise he denies upper GI or hepatobiliary complaints. He does have non-insulin-dependent diabetes, chronic depression, and mild essential hypertension.  CT scan of the abdomen last December showed fluid-filled small bowel loops suggestive of partial small bowel obstruction. The patient has diffuse atherogenesis, but there was no evidence of mesenteric arterial occlusion. CBC in January of this year was normal. Last colonoscopy was in January 2008. This patient denies anorexia, weight loss, or any specific food intolerances. He does have a history of prosthetic seed implantation.  I have reviewed this patient's present history, medical and surgical past history, allergies and medications.     ROS: The remainder of the 10 point ROS is negative     Physical Exam: Elderly-appearing patient in no acute distress appearing older than his stated age. Blood pressure 124/60, pulse 60 and regular, weight under and 67 pounds with BMI of 26.16. Examination of the neck and oral pharyngeal area is unremarkable. General well developed well nourished patient in no  acute distress, appearing their stated age Eyes PERRLA, no icterus, fundoscopic exam per opthamologist Skin no lesions noted Neck supple, no adenopathy, no thyroid enlargement, no tenderness Chest clear to percussion and auscultation Heart no significant murmurs, gallops or rubs noted Abdomen no hepatosplenomegaly masses or tenderness, BS normal.  Rectal inspection normal no fissures, or fistulae noted.  No masses or tenderness on digital exam. Stool guaiac negative. There is formed stool the rectal vault which is normal color and guaiac-negative. Extremities no acute joint lesions, edema, phlebitis or evidence of cellulitis. Neurologic patient oriented x 3, cranial nerves intact, no focal neurologic deficits noted. Psychological mental status normal and normal affect.  Assessment and plan: Probable recurrent bacterial overgrowth syndrome. His symptoms do not seem consistent with carcinoid syndrome, malignancy, but we do need to exclude partial colonic obstruction, and other cause of malabsorption. I have ordered stool exams, followup labs, and will do followup endoscopy and colonoscopy with both small bowel and colon biopsies. I will give him an empiric trial of Xifaxan 550 mg twice a day with probiotic therapy for 2 weeks. I doubt he is continued sore throat has anything to do with his gut. He is on multivitamins. The patient also has been on methotrexate 7.5 mg a week for the last several months per ophthalmology because of macular degeneration. Also is on daily folic acid, Flomax, Wellbutrin, Ativan, and Januvia. Encounter Diagnoses  Name Primary?  . Abdominal pain Yes  . Diarrhea

## 2012-03-22 ENCOUNTER — Other Ambulatory Visit: Payer: Medicare Other

## 2012-03-22 DIAGNOSIS — R109 Unspecified abdominal pain: Secondary | ICD-10-CM

## 2012-03-22 DIAGNOSIS — R197 Diarrhea, unspecified: Secondary | ICD-10-CM

## 2012-03-22 LAB — CELIAC PANEL 10
Gliadin IgA: 2.2 U/mL (ref <20)
IgA: 182 mg/dL (ref 68–379)

## 2012-03-22 NOTE — Progress Notes (Signed)
Insurance made patient pay $200 out of pocket for his [name] so he requested a Movi prep and I gave him a sample of it.

## 2012-03-23 LAB — GIARDIA/CRYPTOSPORIDIUM (EIA)
Cryptosporidium Screen (EIA): NEGATIVE
Giardia Screen (EIA): NEGATIVE

## 2012-03-24 LAB — CLOSTRIDIUM DIFFICILE EIA: CDIFTX: NEGATIVE

## 2012-03-25 LAB — FECAL FAT QUALITATIVE
Free Fatty Acids: NORMAL
NEUTRAL FAT: NORMAL

## 2012-03-29 ENCOUNTER — Encounter (HOSPITAL_COMMUNITY): Payer: Self-pay | Admitting: Emergency Medicine

## 2012-03-29 ENCOUNTER — Other Ambulatory Visit: Payer: Self-pay

## 2012-03-29 ENCOUNTER — Emergency Department (HOSPITAL_COMMUNITY): Payer: Medicare Other

## 2012-03-29 ENCOUNTER — Emergency Department (HOSPITAL_COMMUNITY)
Admission: EM | Admit: 2012-03-29 | Discharge: 2012-03-29 | Disposition: A | Discharge status: Home / Self Care | Payer: Medicare Other | Attending: Emergency Medicine | Admitting: Emergency Medicine

## 2012-03-29 DIAGNOSIS — K5900 Constipation, unspecified: Secondary | ICD-10-CM

## 2012-03-29 DIAGNOSIS — R490 Dysphonia: Secondary | ICD-10-CM

## 2012-03-29 DIAGNOSIS — R070 Pain in throat: Secondary | ICD-10-CM | POA: Insufficient documentation

## 2012-03-29 DIAGNOSIS — F411 Generalized anxiety disorder: Secondary | ICD-10-CM | POA: Insufficient documentation

## 2012-03-29 DIAGNOSIS — E785 Hyperlipidemia, unspecified: Secondary | ICD-10-CM | POA: Insufficient documentation

## 2012-03-29 DIAGNOSIS — R197 Diarrhea, unspecified: Secondary | ICD-10-CM

## 2012-03-29 DIAGNOSIS — J312 Chronic pharyngitis: Secondary | ICD-10-CM

## 2012-03-29 DIAGNOSIS — I10 Essential (primary) hypertension: Secondary | ICD-10-CM | POA: Insufficient documentation

## 2012-03-29 DIAGNOSIS — K219 Gastro-esophageal reflux disease without esophagitis: Secondary | ICD-10-CM

## 2012-03-29 DIAGNOSIS — J309 Allergic rhinitis, unspecified: Secondary | ICD-10-CM

## 2012-03-29 DIAGNOSIS — E119 Type 2 diabetes mellitus without complications: Secondary | ICD-10-CM | POA: Insufficient documentation

## 2012-03-29 LAB — COMPREHENSIVE METABOLIC PANEL
AST: 22 U/L (ref 0–37)
Albumin: 3.7 g/dL (ref 3.5–5.2)
Calcium: 9.5 mg/dL (ref 8.4–10.5)
Chloride: 102 mEq/L (ref 96–112)
Creatinine, Ser: 1.27 mg/dL (ref 0.50–1.35)
Total Protein: 6.5 g/dL (ref 6.0–8.3)

## 2012-03-29 LAB — URINALYSIS, ROUTINE W REFLEX MICROSCOPIC
Bilirubin Urine: NEGATIVE
Hgb urine dipstick: NEGATIVE
Ketones, ur: NEGATIVE mg/dL
Specific Gravity, Urine: 1.016 (ref 1.005–1.030)
pH: 7 (ref 5.0–8.0)

## 2012-03-29 LAB — CBC WITH DIFFERENTIAL/PLATELET
Basophils Absolute: 0 10*3/uL (ref 0.0–0.1)
Basophils Relative: 0 % (ref 0–1)
Eosinophils Absolute: 0.1 10*3/uL (ref 0.0–0.7)
Eosinophils Relative: 2 % (ref 0–5)
HCT: 41.5 % (ref 39.0–52.0)
MCH: 30.3 pg (ref 26.0–34.0)
MCHC: 34 g/dL (ref 30.0–36.0)
Monocytes Absolute: 0.5 10*3/uL (ref 0.1–1.0)
Neutro Abs: 5.1 10*3/uL (ref 1.7–7.7)
RDW: 12.7 % (ref 11.5–15.5)

## 2012-03-29 MED ORDER — GI COCKTAIL ~~LOC~~
30.0000 mL | Freq: Once | ORAL | Status: AC
  Administered 2012-03-29: 30 mL via ORAL
  Filled 2012-03-29: qty 30

## 2012-03-29 MED ORDER — PSYLLIUM 28 % PO PACK: 1.0000 | PACK | Freq: Two times a day (BID) | ORAL | Status: DC

## 2012-03-29 MED ORDER — LORATADINE 10 MG PO TABS
10.0000 mg | ORAL_TABLET | Freq: Once | ORAL | Status: AC
  Administered 2012-03-29: 10 mg via ORAL
  Filled 2012-03-29: qty 1

## 2012-03-29 MED ORDER — RANITIDINE HCL 150 MG PO TABS: 150.0000 mg | ORAL_TABLET | Freq: Two times a day (BID) | ORAL | Status: DC

## 2012-03-29 MED ORDER — LORATADINE 10 MG PO TABS: 10.0000 mg | ORAL_TABLET | Freq: Every day | ORAL | Status: DC

## 2012-03-29 MED ORDER — LORATADINE 10 MG PO TABS: 10.0000 mg | ORAL_TABLET | Freq: Once | ORAL | Status: DC

## 2012-03-29 NOTE — ED Notes (Signed)
Pt is aware that a urine specimen and said that he would let us know when he's ready.

## 2012-03-29 NOTE — ED Notes (Signed)
Pt states that all day Saturday and Sunday he was experiencing "not loose but soft" BM.  He took anti-diarrhea medication but it is not helping.  States that he is now feeling dizzy with nasal congestion, and abdominal discomfort.

## 2012-03-29 NOTE — ED Provider Notes (Signed)
History     CSN: [id number]  Arrival date & time 03/29/12  1237   First MD Initiated Contact with Patient 03/29/12 1452      Chief Complaint  Patient presents with  . Dizziness  . URI  . Abdominal Pain    (Consider location/radiation/quality/duration/timing/severity/associated sxs/prior treatment) HPIGerardo Yidel Gonzales is a 78 y.o. male history of couple months of cyclic diarrhea which are described as soft stools, not complete water, no blood, 6-7 times daily occasionally with constipation. Patient has soft stools she will occasionally take Imodium. Patient is scheduled for a upper and lower endoscopy with Dr. Jarold Motto on 04/09/2012.  Patient also is complaining about symptoms of acid reflux for which his physician is put him on an acid blocker-he stopped taking this because he thought it was exacerbating his soft stools. Patient is taking it daily when necessary acid controller. Patient was also placed on a line and xifaxan by Dr. Jarold Motto. Patient says his throat pain is chronic, is been evaluated with scope is currently 7/10, burning, causes of hoarseness, no difficulty swallowing or breathing.  He should also describes a history of seasonal allergic rhinitis including itchy watery eyes for which she frequently uses moisturizing eyedrops, rhinorrhea alternating with congestion. He does not take any antihistamines.  Past Medical History  Diagnosis Date  . Diabetes mellitus   . Depression   . Anxiety   . GERD (gastroesophageal reflux disease)   . Hyperlipemia   . Hypertension     Past Surgical History  Procedure Date  . Fracture surgery   . Hernia repair     History reviewed. No pertinent family history.  History  Substance Use Topics  . Smoking status: Never Smoker   . Smokeless tobacco: Never Used  . Alcohol Use: No      Review of Systems At least 10pt or greater review of systems completed and are negative except where specified in the  HPI.  Allergies  Review of patient's allergies indicates no known allergies.  Home Medications   Current Outpatient Rx  Name Route Sig Dispense Refill  . GELUSIL PO Oral Take 1 tablet by mouth as needed. Sometimes will take more then once a day    . ATORVASTATIN CALCIUM 20 MG PO TABS Oral Take 20 mg by mouth daily.      . BUPROPION HCL ER (XL) 150 MG PO TB24 Oral Take 300 mg by mouth daily.      Marland Kitchen CALCIUM + D PO Oral Take 1 tablet by mouth daily.      . OMEGA-3 FATTY ACIDS 1000 MG PO CAPS Oral Take 1 g by mouth daily.      Marland Kitchen FOLIC ACID 1 MG PO TABS Oral Take 1 mg by mouth daily.      . L-METHYLFOLATE-B6-B12 3-35-2 MG PO TABS Oral Take 1 tablet by mouth 2 (two) times daily.      Marland Kitchen LISINOPRIL 10 MG PO TABS Oral Take 10 mg by mouth daily.      Marland Kitchen LOPERAMIDE HCL 2 MG PO CAPS Oral Take 2 mg by mouth 4 (four) times daily as needed. Diarrhea.    Marland Kitchen LORAZEPAM 0.5 MG PO TABS Oral Take 0.5-1 mg by mouth 2 (two) times daily. Take 1 tablet every morning and 2 tablets every night.    . METHOTREXATE 2.5 MG PO TABS Oral Take 7.5 mg by mouth once a week. On Fridays.  Caution:Chemotherapy. Protect from light.     . ADULT MULTIVITAMIN W/MINERALS CH Oral Take  1 tablet by mouth daily.      . ICAPS PO Oral Take 1 capsule by mouth daily.      Marland Kitchen PRIMIDONE 50 MG PO TABS Oral Take 50 mg by mouth 2 (two) times daily.    Marland Kitchen SITAGLIPTIN PHOSPHATE 100 MG PO TABS Oral Take 100 mg by mouth daily.      Marland Kitchen TAMSULOSIN HCL 0.4 MG PO CAPS Oral Take 0.8 mg by mouth daily.     Marland Kitchen TEMAZEPAM 15 MG PO CAPS Oral Take 45 mg by mouth at bedtime. For sleep.     Marland Kitchen ALIGN 4 MG PO CAPS Oral Take 1 capsule by mouth.    Marland Kitchen RIFAXIMIN 550 MG PO TABS Oral Take 550 mg by mouth 2 (two) times daily.      BP 138/71  Pulse 61  Temp 98.1 F (36.7 C) (Oral)  SpO2 98%  Physical Exam  Nursing notes reviewed.  Electronic medical record reviewed. VITAL SIGNS:   Filed Vitals:   03/29/12 1243 03/29/12 1339 03/29/12 1810 03/29/12 1847  BP: 149/70  138/71 162/76 153/76  Pulse: 85 61 84 59  Temp: 98.1 F (36.7 C)  98 F (36.7 C) 98.8 F (37.1 C)  TempSrc: Oral   Oral  Resp:    18  SpO2: 100% 98% 97% 100%   CONSTITUTIONAL: Awake, oriented, appears non-toxic HENT: Atraumatic, normocephalic, oral mucosa pink and moist, airway patent. Nares patent without drainage. External ears normal. EYES: Conjunctiva clear, EOMI, PERRLA NECK: Trachea midline, non-tender, supple CARDIOVASCULAR: Normal heart rate, Normal rhythm, No murmurs, rubs, gallops PULMONARY/CHEST: Clear to auscultation, no rhonchi, wheezes, or rales. Symmetrical breath sounds. Non-tender. ABDOMINAL: Non-distended, soft, non-tender - no rebound or guarding.  BS normal. NEUROLOGIC: Non-focal, moving all four extremities, no gross sensory or motor deficits. EXTREMITIES: No clubbing, cyanosis, or edema SKIN: Warm, Dry, No erythema, No rash  ED Course  Procedures (including critical care time)  Labs Reviewed  COMPREHENSIVE METABOLIC PANEL - Abnormal; Notable for the following:    Glucose, Bld 100 (*)     GFR calc non Af Amer 53 (*)     GFR calc Af Amer 61 (*)     All other components within normal limits  URINALYSIS, ROUTINE W REFLEX MICROSCOPIC - Abnormal; Notable for the following:    APPearance CLOUDY (*)     All other components within normal limits  CBC WITH DIFFERENTIAL  RAPID STREP SCREEN  LAB REPORT - SCANNED   Dg Abd 2 Views  03/29/2012  *RADIOLOGY REPORT*  Clinical Data: Change in bowel habits.  ABDOMEN - 2 VIEW  Comparison: CT abdomen 06/26/2011  Findings: No evidence of bowel obstruction or free air.  Prostate radiation seeds noted.  No suspicious calcifications organomegaly.  IMPRESSION: No acute findings.   Original Report Authenticated By: Cyndie Chime, M.D.      1. Sore throat, chronic   2. Allergic rhinitis   3. Diarrhea   4. Constipation   5. HOARSENESS, CHRONIC   6. GERD       MDM  [name] is a 78 y.o. male in with  complaints consistent with allergic rhinitis, it also sounds that he's got some GERD that has been treated in the past no longer on any controller medicine. Patient will need an upper endoscopy and he is scheduled for that on 04/09/2012. Says he's got some hoarseness which may be due to severe acid reflux causing damage to the vocal cords. Based on his two-month history of alternating  diarrhea and constipation he may also be patient with irritable bowel syndrome-he is in good hands with Dr. Jarold Motto who is working the patient up completely and is our he started treating him. I will start treating him here by changing to an H2 blocker and see if that does not cause side effects or worsen his diarrhea. In addition we'll start some Claritin to see if we can control some of the rhinitis the patient has.  The patient is otherwise appears well, slightly anxious, has no abdominal pain there are no suggestions for his presentation about a surgical abdomen or any acute intra-abdominal intrapelvic process requiring emergent treatment at this time.  I explained the diagnosis and have given explicit precautions to return to the ER including abdominal pain, vomiting or any other new or worsening symptoms. The patient understands and accepts the medical plan as it's been dictated and I have answered their questions. Discharge instructions concerning home care and prescriptions have been given.  The patient is STABLE and is discharged to home in good condition.          Jones Skene, MD 03/31/12 1307

## 2012-04-07 ENCOUNTER — Other Ambulatory Visit: Payer: Self-pay | Admitting: Gastroenterology

## 2012-04-07 ENCOUNTER — Telehealth: Payer: Self-pay | Admitting: Gastroenterology

## 2012-04-07 MED ORDER — PANCRELIPASE (LIP-PROT-AMYL) 25000 UNITS PO CPEP: 2.0000 | ORAL_CAPSULE | Freq: Three times a day (TID) | ORAL | Status: DC

## 2012-04-07 NOTE — Telephone Encounter (Signed)
Daughter-in-law called wanting to know if she could leave the building while pt is having his colon for to run an errand. Let her know the procedure would be stopped if she left, someone has to be here in the building in case something happened with the pt. She verbalized understanding.

## 2012-04-08 ENCOUNTER — Telehealth: Payer: Self-pay | Admitting: Gastroenterology

## 2012-04-08 MED ORDER — PANCRELIPASE (LIP-PROT-AMYL) 25000 UNITS PO CPEP: 2.0000 | ORAL_CAPSULE | Freq: Three times a day (TID) | ORAL | Status: DC

## 2012-04-08 NOTE — Telephone Encounter (Signed)
Returned call to patient. He reports he has started with a runny nose and sore throat yesterday, scheduled for procedure 10/11. He has not mixed his prep, cancelled appt for tomorrow and rescheduled 10/16 at 2 pm

## 2012-04-08 NOTE — Telephone Encounter (Signed)
Pt cannot afford the Zenpep. Samples left up front for pt to pick up, pt is aware.

## 2012-04-09 ENCOUNTER — Encounter: Payer: Medicare Other | Admitting: Gastroenterology

## 2012-04-14 ENCOUNTER — Encounter: Payer: Medicare Other | Admitting: Gastroenterology

## 2012-04-16 ENCOUNTER — Encounter: Payer: Self-pay | Admitting: Gastroenterology

## 2012-04-16 ENCOUNTER — Ambulatory Visit (AMBULATORY_SURGERY_CENTER): Payer: Medicare Other | Admitting: Gastroenterology

## 2012-04-16 ENCOUNTER — Other Ambulatory Visit: Payer: Self-pay | Admitting: Internal Medicine

## 2012-04-16 VITALS — BP 136/78 | HR 51 | Temp 97.1°F | Resp 16 | Ht 67.0 in | Wt 167.0 lb

## 2012-04-16 DIAGNOSIS — R109 Unspecified abdominal pain: Secondary | ICD-10-CM

## 2012-04-16 DIAGNOSIS — K2940 Chronic atrophic gastritis without bleeding: Secondary | ICD-10-CM

## 2012-04-16 DIAGNOSIS — R197 Diarrhea, unspecified: Secondary | ICD-10-CM

## 2012-04-16 DIAGNOSIS — D131 Benign neoplasm of stomach: Secondary | ICD-10-CM

## 2012-04-16 DIAGNOSIS — Z1211 Encounter for screening for malignant neoplasm of colon: Secondary | ICD-10-CM

## 2012-04-16 DIAGNOSIS — K2950 Unspecified chronic gastritis without bleeding: Secondary | ICD-10-CM

## 2012-04-16 MED ORDER — SODIUM CHLORIDE 0.9 % IV SOLN: 500.0000 mL | INTRAVENOUS | Status: DC

## 2012-04-16 NOTE — Patient Instructions (Addendum)

## 2012-04-16 NOTE — Progress Notes (Signed)
Patient did not experience any of the following events: a burn prior to discharge; a fall within the facility; wrong site/side/patient/procedure/implant event; or a hospital transfer or hospital admission upon discharge from the facility. (G8907) Patient did not have preoperative order for IV antibiotic SSI prophylaxis. (G8918)  

## 2012-04-16 NOTE — Op Note (Signed)
[facility] [address] [place], [address]   ENDOSCOPY PROCEDURE REPORT  PATIENT: [name], [name]  MR#: [id number] BIRTHDATE: March 17, 1933 , 79  yrs. old GENDER: Male ENDOSCOPIST:David Hale Bogus, MD, [facility] REFERRED BY: PROCEDURE DATE:  04/16/2012 PROCEDURE:   EGD w/ biopsy ASA CLASS:    Class II INDICATIONS: epigastric pain.  gas and bloating,?? bacterial overgrowth syndrome MEDICATION: There was residual sedation effect present from prior procedure and Propofol (Diprivan) 60 mg IV TOPICAL ANESTHETIC:   Cetacaine Spray  DESCRIPTION OF PROCEDURE:   After the risks and benefits of the procedure were explained, informed consent was obtained.  The LB GIF-H180 [id number]  endoscope was introduced through the mouth  and advanced to the second portion of the duodenum .  The instrument was slowly withdrawn as the mucosa was fully examined.      STOMACH: Abnormal mucosa was found in the entire examined stomach. Otherwise normal exam.  ESOPHAGUS: The esophagus was otherwise normal.  DUODENUM: The duodenal mucosa showed no abnormalities. Marland KitchenMarland KitchenMarland KitchenOtherwise normal exam.   Retroflexed views revealed no abnormalities.    The scope was then withdrawn from the patient and the procedure completed.  COMPLICATIONS: There were no complications.   ENDOSCOPIC IMPRESSION: 1.   Abnormal mucosa was found in the entire examined stomach 2.   The esophagus was otherwise normal. 3.   The duodenal mucosa showed no abnormalities  RECOMMENDATIONS: 1.  await pathology results 2.  continue current medications    _______________________________ eSigned:  Mardella Layman, MD, [facility] 04/16/2012 3:07 PM

## 2012-04-16 NOTE — Op Note (Signed)
[facility] [address] [place], [address]   COLONOSCOPY PROCEDURE REPORT  PATIENT: [name], [name]  MR#: [id number] BIRTHDATE: September 03, 1932 , 79  yrs. old GENDER: Male ENDOSCOPIST: Mardella Layman, MD, [facility] REFERRED BY: PROCEDURE DATE:  04/16/2012 PROCEDURE:   Colonoscopy, diagnostic ASA CLASS:   Class II INDICATIONS:average risk patient for colon cancer, bloating, and change in bowel habits. MEDICATIONS: Propofol (Diprivan) 110 mg  DESCRIPTION OF PROCEDURE:   After the risks and benefits and of the procedure were explained, informed consent was obtained.  A digital rectal exam revealed no abnormalities of the rectum.    The LB CF-H180AL [id number]  endoscope was introduced through the anus and advanced to the terminal ileum which was intubated for a short distance .  The quality of the prep was good, using MoviPrep .  The instrument was then slowly withdrawn as the colon was fully examined.     COLON FINDINGS: Moderate diverticulosis was noted in the descending colon and sigmoid colon.   The mucosa appeared normal in the terminal ileum and scope passed well into the ileum,mucosa entirely normal. No polyps or cancer noted.    Retroflexed views revealed no abnormalities.     The scope was then withdrawn from the patient and the procedure completed.  COMPLICATIONS: There were no complications. ENDOSCOPIC IMPRESSION: 1.   Moderate diverticulosis was noted in the descending colon and sigmoid colon 2.   Normal mucosa in the terminal ileum and colopn.  RECOMMENDATIONS: 1.  continue current medications 2.  Upper endoscopy will be scheduled   REPEAT EXAM:  cc:Roy Ludwig Clarks MD  _______________________________ eSigned:  Mardella Layman, MD, [facility] 04/16/2012 2:55 PM     PATIENT NAME:  [name], [name] MR#: [id number]

## 2012-04-19 ENCOUNTER — Telehealth: Payer: Self-pay | Admitting: *Deleted

## 2012-04-19 NOTE — Telephone Encounter (Signed)
  Follow up Call-  Call back number 04/16/2012  Post procedure Call Back phone  # [phone]  Permission to leave phone message Yes     Patient questions:  Do you have a fever, pain , or abdominal swelling? no Pain Score  0 *  Have you tolerated food without any problems? yes  Have you been able to return to your normal activities? yes  Do you have any questions about your discharge instructions: Diet   no Medications  no Follow up visit  no  Do you have questions or concerns about your Care? no  Actions: * If pain score is 4 or above: No action needed, pain <4.

## 2012-04-20 ENCOUNTER — Encounter: Payer: Self-pay | Admitting: Internal Medicine

## 2012-05-05 ENCOUNTER — Encounter: Payer: Self-pay | Admitting: Gastroenterology

## 2012-05-10 ENCOUNTER — Other Ambulatory Visit: Payer: Self-pay | Admitting: *Deleted

## 2012-05-10 ENCOUNTER — Telehealth: Payer: Self-pay | Admitting: Gastroenterology

## 2012-05-10 NOTE — Telephone Encounter (Signed)
OK to send letter per Darylene Price.

## 2012-05-10 NOTE — Telephone Encounter (Signed)
Informed pt of his results and he reports he has not received a letter yet. Pt stated understanding with results. Letter was written on 05/05/12, will check with [facility] staff about mailing the results.

## 2012-09-02 ENCOUNTER — Ambulatory Visit (INDEPENDENT_AMBULATORY_CARE_PROVIDER_SITE_OTHER): Payer: Medicare Other | Admitting: Emergency Medicine

## 2012-09-02 ENCOUNTER — Ambulatory Visit: Payer: Medicare Other

## 2012-09-02 VITALS — BP 122/73 | HR 80 | Temp 97.6°F | Resp 18 | Wt 165.0 lb

## 2012-09-02 DIAGNOSIS — I10 Essential (primary) hypertension: Secondary | ICD-10-CM

## 2012-09-02 DIAGNOSIS — N4231 Prostatic intraepithelial neoplasia: Secondary | ICD-10-CM

## 2012-09-02 DIAGNOSIS — M545 Low back pain, unspecified: Secondary | ICD-10-CM

## 2012-09-02 DIAGNOSIS — D4959 Neoplasm of unspecified behavior of other genitourinary organ: Secondary | ICD-10-CM

## 2012-09-02 DIAGNOSIS — E119 Type 2 diabetes mellitus without complications: Secondary | ICD-10-CM

## 2012-09-02 LAB — COMPREHENSIVE METABOLIC PANEL
BUN: 15 mg/dL (ref 6–23)
CO2: 31 mEq/L (ref 19–32)
Calcium: 10.2 mg/dL (ref 8.4–10.5)
Chloride: 102 mEq/L (ref 96–112)
Creat: 1.22 mg/dL (ref 0.50–1.35)

## 2012-09-02 LAB — POCT CBC
HCT, POC: 46.8 % (ref 43.5–53.7)
Hemoglobin: 14.9 g/dL (ref 14.1–18.1)
Lymph, poc: 1.1 (ref 0.6–3.4)
MCH, POC: 29.4 pg (ref 27–31.2)
MCHC: 31.8 g/dL (ref 31.8–35.4)
POC MID %: 6.1 %M (ref 0–12)
WBC: 5.7 10*3/uL (ref 4.6–10.2)

## 2012-09-02 MED ORDER — CYCLOBENZAPRINE HCL 5 MG PO TABS: 5.0000 mg | ORAL_TABLET | Freq: Three times a day (TID) | ORAL | Status: DC | PRN

## 2012-09-02 MED ORDER — HYDROCODONE-ACETAMINOPHEN 5-325 MG PO TABS: 1.0000 | ORAL_TABLET | Freq: Four times a day (QID) | ORAL | Status: DC | PRN

## 2012-09-02 NOTE — Progress Notes (Signed)
  Subjective:    Patient ID: [name], male    DOB: 08-21-1932, 79 y.o.   MRN: [id number]  Back Pain This is a recurrent problem. The problem occurs constantly. The problem has been gradually worsening since onset. The pain is present in the lumbar spine. The quality of the pain is described as shooting. The pain is at a severity of 6/10. The pain is moderate. The pain is the same all the time. The symptoms are aggravated by bending, lying down, sitting, twisting and standing.   Patient comes into our clinic today with complaint of back pain for the last 3 days. He has used ice and ibuprofen for the pain it helps some. HX of back pain.  He went to a chiropractor yesterday and he's more sore now. He also has some swelling under his eyes he claim that he stays cold all the time    Review of Systems  Musculoskeletal: Positive for back pain.       Objective:   Physical Exam [facility] reading (PRIMARY) by  Dr.Daub there are degenerative changes in the lumbar spine with mild disc space narrowing. Patient is puffiness beneath both eyes. His neck is supple. Chest is clear to both auscultation and percussion. Cardiac exam reveals a regular rate and rhythm. Abdomen is soft and nontender. Examination reveals exquisite tenderness over lower lobar spine deep tendon reflexes are 2+ and symmetrical. Straight leg raising is negative bilaterally.  Results for orders placed in visit on 09/02/12  POCT CBC      Result Value Range   WBC 5.7  4.6 - 10.2 K/uL   Lymph, poc 1.1  0.6 - 3.4   POC LYMPH PERCENT 19.8  10 - 50 %L   MID (cbc) 0.3  0 - 0.9   POC MID % 6.1  0 - 12 %M   POC Granulocyte 4.2  2 - 6.9   Granulocyte percent 74.1  37 - 80 %G   RBC 5.07  4.69 - 6.13 M/uL   Hemoglobin 14.9  14.1 - 18.1 g/dL   HCT, POC 78.2  95.6 - 53.7 %   MCV 92.3  80 - 97 fL   MCH, POC 29.4  27 - 31.2 pg   MCHC 31.8  31.8 - 35.4 g/dL   RDW, POC 21.3     Platelet Count, POC 234  142 - 424 K/uL   MPV 8.8  0 -  99.8 fL  GLUCOSE, POCT (MANUAL RESULT ENTRY)      Result Value Range   POC Glucose 93  70 - 99 mg/dl        Assessment & Plan:  Patient is a diabetic so I do not want him on steroids. We'll use hydrocodone for pain Flexeril at night and schedule an MRI of the back .

## 2012-09-02 NOTE — Patient Instructions (Addendum)
Back Pain, Adult Low back pain is very common. About 1 in 5 people have back pain.The cause of low back pain is rarely dangerous. The pain often gets better over time.About half of people with a sudden onset of back pain feel better in just 2 weeks. About 8 in 10 people feel better by 6 weeks.  CAUSES Some common causes of back pain include:  Strain of the muscles or ligaments supporting the spine.  Wear and tear (degeneration) of the spinal discs.  Arthritis.  Direct injury to the back. DIAGNOSIS Most of the time, the direct cause of low back pain is not known.However, back pain can be treated effectively even when the exact cause of the pain is unknown.Answering your caregiver's questions about your overall health and symptoms is one of the most accurate ways to make sure the cause of your pain is not dangerous. If your caregiver needs more information, he or she may order lab work or imaging tests (X-rays or MRIs).However, even if imaging tests show changes in your back, this usually does not require surgery. HOME CARE INSTRUCTIONS For many people, back pain returns.Since low back pain is rarely dangerous, it is often a condition that people can learn to manageon their own.   Remain active. It is stressful on the back to sit or stand in one place. Do not sit, drive, or stand in one place for more than 30 minutes at a time. Take short walks on level surfaces as soon as pain allows.Try to increase the length of time you walk each day.  Do not stay in bed.Resting more than 1 or 2 days can delay your recovery.  Do not avoid exercise or work.Your body is made to move.It is not dangerous to be active, even though your back may hurt.Your back will likely heal faster if you return to being active before your pain is gone.  Pay attention to your body when you bend and lift. Many people have less discomfortwhen lifting if they bend their knees, keep the load close to their bodies,and  avoid twisting. Often, the most comfortable positions are those that put less stress on your recovering back.  Find a comfortable position to sleep. Use a firm mattress and lie on your side with your knees slightly bent. If you lie on your back, put a pillow under your knees.  Only take over-the-counter or prescription medicines as directed by your caregiver. Over-the-counter medicines to reduce pain and inflammation are often the most helpful.Your caregiver may prescribe muscle relaxant drugs.These medicines help dull your pain so you can more quickly return to your normal activities and healthy exercise.  Put ice on the injured area.  Put ice in a plastic bag.  Place a towel between your skin and the bag.  Leave the ice on for 15 to 20 minutes, 3 to 4 times a day for the first 2 to 3 days. After that, ice and heat may be alternated to reduce pain and spasms.  Ask your caregiver about trying back exercises and gentle massage. This may be of some benefit.  Avoid feeling anxious or stressed.Stress increases muscle tension and can worsen back pain.It is important to recognize when you are anxious or stressed and learn ways to manage it.Exercise is a great option. SEEK MEDICAL CARE IF:  You have pain that is not relieved with rest or medicine.  You have pain that does not improve in 1 week.  You have new symptoms.  You are generally   not feeling well. SEEK IMMEDIATE MEDICAL CARE IF:   You have pain that radiates from your back into your legs.  You develop new bowel or bladder control problems.  You have unusual weakness or numbness in your arms or legs.  You develop nausea or vomiting.  You develop abdominal pain.  You feel faint. Document Released: 06/16/2005 Document Revised: 12/16/2011 Document Reviewed: 11/04/2010 [employer] Patient Information 2013 [employer].  

## 2012-09-07 LAB — IMMUNOFIXATION ELECTROPHORESIS
IgG (Immunoglobin G), Serum: 992 mg/dL (ref 650–1600)
IgM, Serum: 80 mg/dL (ref 41–251)
Total Protein, Serum Electrophoresis: 7.5 g/dL (ref 6.0–8.3)

## 2012-09-07 LAB — PROTEIN ELECTROPHORESIS, SERUM
Albumin ELP: 58.5 % (ref 55.8–66.1)
Alpha-1-Globulin: 5 % — ABNORMAL HIGH (ref 2.9–4.9)
Alpha-2-Globulin: 11.5 % (ref 7.1–11.8)
Total Protein, Serum Electrophoresis: 7.1 g/dL (ref 6.0–8.3)

## 2012-09-08 ENCOUNTER — Ambulatory Visit
Admission: RE | Admit: 2012-09-08 | Discharge: 2012-09-08 | Disposition: A | Discharge status: Home / Self Care | Payer: Medicare Other | Source: Ambulatory Visit | Attending: Emergency Medicine | Admitting: Emergency Medicine

## 2012-09-08 DIAGNOSIS — M545 Low back pain, unspecified: Secondary | ICD-10-CM

## 2012-09-10 ENCOUNTER — Telehealth: Payer: Self-pay

## 2012-09-10 NOTE — Telephone Encounter (Signed)
Referral was made for him to [facility], he states he called there and they did not know him, i advised him will take couple days.

## 2012-09-10 NOTE — Telephone Encounter (Signed)
Pt states he's confused as to where his mri results were sent. States he spoke with amy about an hour ago and requested they be sent to his primary and she mentioned [facility] to him and he is confused.   Please call pt  Best:[phone]  bf

## 2012-09-17 ENCOUNTER — Telehealth: Payer: Self-pay

## 2012-09-17 NOTE — Telephone Encounter (Signed)
Patient advised his information was sent to [facility] surprised he has not heard from them yet. He is provided the office number to them. Will you also check on this for me?

## 2012-09-17 NOTE — Telephone Encounter (Signed)
PATIENT WOULD LIKE REFERRAL TO A NEUROSURGEON IF POSSIBLE

## 2012-11-01 ENCOUNTER — Encounter: Payer: Self-pay | Admitting: Emergency Medicine

## 2013-02-02 ENCOUNTER — Other Ambulatory Visit: Payer: Self-pay

## 2013-02-03 ENCOUNTER — Other Ambulatory Visit: Payer: Self-pay | Admitting: Radiology

## 2013-02-03 DIAGNOSIS — C61 Malignant neoplasm of prostate: Secondary | ICD-10-CM | POA: Insufficient documentation

## 2013-02-03 DIAGNOSIS — N200 Calculus of kidney: Secondary | ICD-10-CM

## 2013-02-03 NOTE — Assessment & Plan Note (Signed)
  Dr Ottelin 

## 2013-05-05 ENCOUNTER — Other Ambulatory Visit: Payer: Self-pay

## 2014-12-25 ENCOUNTER — Other Ambulatory Visit: Payer: Self-pay

## 2016-11-17 ENCOUNTER — Other Ambulatory Visit: Payer: Self-pay

## 2016-11-17 ENCOUNTER — Telehealth: Payer: Self-pay | Admitting: Cardiovascular Disease

## 2016-11-17 ENCOUNTER — Emergency Department (HOSPITAL_COMMUNITY): Payer: Commercial Managed Care - HMO

## 2016-11-17 ENCOUNTER — Emergency Department (HOSPITAL_COMMUNITY)
Admission: EM | Admit: 2016-11-17 | Discharge: 2016-11-17 | Discharge status: Against Medical Advice | Payer: Commercial Managed Care - HMO | Attending: Emergency Medicine | Admitting: Emergency Medicine

## 2016-11-17 ENCOUNTER — Encounter (HOSPITAL_COMMUNITY): Payer: Self-pay | Admitting: Emergency Medicine

## 2016-11-17 DIAGNOSIS — M79671 Pain in right foot: Secondary | ICD-10-CM | POA: Diagnosis not present

## 2016-11-17 DIAGNOSIS — I440 Atrioventricular block, first degree: Secondary | ICD-10-CM

## 2016-11-17 DIAGNOSIS — Z79899 Other long term (current) drug therapy: Secondary | ICD-10-CM | POA: Diagnosis not present

## 2016-11-17 DIAGNOSIS — R079 Chest pain, unspecified: Secondary | ICD-10-CM | POA: Diagnosis present

## 2016-11-17 DIAGNOSIS — Z8546 Personal history of malignant neoplasm of prostate: Secondary | ICD-10-CM | POA: Insufficient documentation

## 2016-11-17 DIAGNOSIS — M79672 Pain in left foot: Secondary | ICD-10-CM | POA: Diagnosis not present

## 2016-11-17 DIAGNOSIS — I10 Essential (primary) hypertension: Secondary | ICD-10-CM | POA: Insufficient documentation

## 2016-11-17 DIAGNOSIS — J029 Acute pharyngitis, unspecified: Secondary | ICD-10-CM | POA: Diagnosis not present

## 2016-11-17 DIAGNOSIS — E119 Type 2 diabetes mellitus without complications: Secondary | ICD-10-CM | POA: Insufficient documentation

## 2016-11-17 LAB — CBC
HCT: 45.1 % (ref 39.0–52.0)
Hemoglobin: 14.6 g/dL (ref 13.0–17.0)
MCH: 28.9 pg (ref 26.0–34.0)
MCHC: 32.4 g/dL (ref 30.0–36.0)
MCV: 89.3 fL (ref 78.0–100.0)
PLATELETS: 225 10*3/uL (ref 150–400)
RBC: 5.05 MIL/uL (ref 4.22–5.81)
RDW: 13.5 % (ref 11.5–15.5)
WBC: 5 10*3/uL (ref 4.0–10.5)

## 2016-11-17 LAB — BASIC METABOLIC PANEL
ANION GAP: 7 (ref 5–15)
BUN: 14 mg/dL (ref 6–20)
CO2: 27 mmol/L (ref 22–32)
CREATININE: 1.08 mg/dL (ref 0.61–1.24)
Calcium: 9.5 mg/dL (ref 8.9–10.3)
Chloride: 104 mmol/L (ref 101–111)
GFR calc non Af Amer: >60 mL/min (ref >60)
Glucose, Bld: 101 mg/dL — ABNORMAL HIGH (ref 65–99)
Potassium: 3.9 mmol/L (ref 3.5–5.1)
Sodium: 138 mmol/L (ref 135–145)

## 2016-11-17 LAB — I-STAT TROPONIN, ED: Troponin i, poc: 0 ng/mL (ref 0.00–0.08)

## 2016-11-17 MED ORDER — SODIUM CHLORIDE 0.9 % IV BOLUS (SEPSIS): 1000.0000 mL | Freq: Once | INTRAVENOUS | Status: DC

## 2016-11-17 NOTE — ED Triage Notes (Signed)
Pt sent here from [facility] for abnormal EKG; pt sts bilateral foot pain and sore throat; pt sts CP several days ago but not currently

## 2016-11-17 NOTE — ED Provider Notes (Signed)
[facility] DEPT Provider Note   CSN: [id number] Arrival date & time: 11/17/16  1259     History   Chief Complaint Chief Complaint  Patient presents with  . Foot Pain  . Sore Throat    HPI [name] is a 83 y.o. male.  Patient with multiple complaints including sore throat, foot numbness, intermittent chest pain, intermittent dizziness. Patient with slowed heart rate after starting propranolol earlier this month and since that time has had some dizziness and chest pain. Patient is not propranolol for the last several days and continues to be symptomatic. Denies loss of consciousness. Patient states that he is very active and has some chest pain at rest but not with exertion. Patient states that he has felt also some dizziness. Patient recently started on gabapentin for foot numbness, hx of diabetes. Patient also with sore throat and itchy eyes but no fever, chills.   The history is provided by the patient.  Illness  This is a new problem. The current episode started more than 2 days ago. The problem occurs daily. The problem has not changed since onset.Associated symptoms include chest pain (intermittent). Pertinent negatives include no abdominal pain, no headaches and no shortness of breath. Nothing aggravates the symptoms. Nothing relieves the symptoms.    Past Medical History:  Diagnosis Date  . Anxiety   . Cataract   . Depression   . Diabetes mellitus   . GERD (gastroesophageal reflux disease)   . Hyperlipemia   . Hypertension     Patient Active Problem List   Diagnosis Date Noted  . Prostate cancer ([place]) 02/03/2013  . ANXIETY 03/07/2010  . ESSENTIAL HYPERTENSION, BENIGN 03/07/2010  . GERD 03/07/2010  . HOARSENESS, CHRONIC 03/07/2010  . DIABETES MELLITUS 03/06/2010  . HYPERCHOLESTEROLEMIA 03/06/2010  . DEPRESSION 03/06/2010  . HIATAL HERNIA WITH REFLUX 03/06/2010  . DIVERTICULOSIS, COLON 03/06/2010  . IBS 03/06/2010  . RENAL CALCULUS,  RECURRENT 03/06/2010  . COUGH 03/06/2010    Past Surgical History:  Procedure Laterality Date  . FRACTURE SURGERY    . HERNIA REPAIR         Home Medications    Prior to Admission medications   Medication Sig Start Date End Date Taking? Authorizing Provider  amLODipine (NORVASC) 5 MG tablet Take 5 mg by mouth daily. 09/16/16  Yes [provider]  buPROPion (WELLBUTRIN XL) 300 MG 24 hr tablet Take 300 mg by mouth daily. 11/14/16  Yes [provider]  fish oil-omega-3 fatty acids 1000 MG capsule Take 1 g by mouth daily.     Yes [provider]  l-methylfolate-B6-B12 (METANX) 3-35-2 MG TABS Take 1 tablet by mouth 2 (two) times daily.     Yes [provider]  LORazepam (ATIVAN) 0.5 MG tablet Take 0.5 mg by mouth 2 (two) times daily.    Yes [provider]  Multiple Vitamin (MULITIVITAMIN WITH MINERALS) TABS Take 1 tablet by mouth daily.     Yes [provider]  Multiple Vitamins-Minerals (ICAPS PO) Take 1 tablet by mouth daily.   Yes [provider]  primidone (MYSOLINE) 50 MG tablet Take 50 mg by mouth 2 (two) times daily.   Yes [provider]  temazepam (RESTORIL) 15 MG capsule Take 30 mg by mouth at bedtime. For sleep.    Yes [provider]  cyclobenzaprine (FLEXERIL) 5 MG tablet Take 1 tablet (5 mg total) by mouth 3 (three) times daily as needed for muscle spasms. Patient not taking: Reported on 11/17/2016  09/02/12   Darlyne Russian, MD  loratadine (CLARITIN) 10 MG tablet Take 1 tablet (10 mg total) by mouth daily. Patient not taking: Reported on 11/17/2016 03/29/12   Rhunette Croft, MD    Family History Family History  Problem Relation Age of Onset  . Lung cancer Brother     Social History Social History  Substance Use Topics  . Smoking status: Never Smoker  . Smokeless tobacco: Never Used  . Alcohol use No     Allergies   Patient has no known allergies.   Review of Systems Review of  Systems  Constitutional: Negative for chills and fever.  HENT: Positive for sore throat. Negative for ear pain.   Eyes: Positive for discharge. Negative for pain and visual disturbance.  Respiratory: Negative for cough and shortness of breath.   Cardiovascular: Positive for chest pain (intermittent). Negative for palpitations and leg swelling.  Gastrointestinal: Negative for abdominal pain and vomiting.  Genitourinary: Negative for dysuria and hematuria.  Musculoskeletal: Negative for arthralgias and back pain.  Skin: Negative for color change and rash.  Neurological: Positive for dizziness and numbness (b/l foot numbness). Negative for seizures, syncope, facial asymmetry and headaches.  All other systems reviewed and are negative.    Physical Exam Updated Vital Signs  ED Triage Vitals [11/17/16 1312]  Enc Vitals Group     BP (!) 155/77     Pulse Rate 63     Resp 18     Temp 98.5 F (36.9 C)     Temp Source Oral     SpO2 97 %     Weight      Height      Head Circumference      Peak Flow      Pain Score 7     Pain Loc      Pain Edu?      Excl. in [place]?     Physical Exam  Constitutional: He is oriented to person, place, and time. He appears well-developed and well-nourished.  HENT:  Head: Normocephalic and atraumatic.  Mouth/Throat: Oropharynx is clear and moist. No oropharyngeal exudate.  Eyes: Conjunctivae and EOM are normal. Pupils are equal, round, and reactive to light.  Neck: Normal range of motion. Neck supple.  Cardiovascular: Regular rhythm, normal heart sounds, intact distal pulses and normal pulses.  Bradycardia present.   No murmur heard. Pulmonary/Chest: Effort normal and breath sounds normal. No respiratory distress. He has no wheezes. He has no rales.  Abdominal: Soft. Bowel sounds are normal. He exhibits no distension. There is no tenderness.  Musculoskeletal: He exhibits no edema.  Neurological: He is alert and oriented to person, place, and time. No  cranial nerve deficit or sensory deficit. He exhibits normal muscle tone. Coordination normal.  Normal sensation to light touch and pinprick, 5+/5 strength, no drift, normal finger to nose finger  Skin: Skin is warm and dry.  Psychiatric: He has a normal mood and affect.  Nursing note and vitals reviewed.    ED Treatments / Results  Labs (all labs ordered are listed, but only abnormal results are displayed) Labs Reviewed  BASIC METABOLIC PANEL - Abnormal; Notable for the following:       Result Value   Glucose, Bld 101 (*)    All other components within normal limits  CBC  I-STAT TROPOININ, ED    EKG  EKG Interpretation  Date/Time:  Monday Nov 17 2016 13:06:03 EDT Ventricular Rate:  66 PR Interval:  220 QRS Duration:  98 QT Interval:  414 QTC Calculation: 434 R Axis:   77 Text Interpretation:  Sinus rhythm with sinus arrhythmia with 1st degree A-V block Otherwise normal ECG When compared to prior, no significant changes seen.  No STEMI Confirmed by Antony Blackbird [phone]) on 11/17/2016 4:03:46 PM       Radiology Dg Chest 2 View  Result Date: 11/17/2016 CLINICAL DATA:  Chest pain. EXAM: CHEST  2 VIEW COMPARISON:  05/21/2010 FINDINGS: The heart size and mediastinal contours are within normal limits. Both lungs are clear. The visualized skeletal structures are unremarkable. IMPRESSION: No active cardiopulmonary disease. Electronically Signed   By: Kathreen Devoid   On: 11/17/2016 13:42    Procedures Procedures (including critical care time)  Medications Ordered in ED Medications - No data to display   Initial Impression / Assessment and Plan / ED Course  I have reviewed the triage vital signs and the nursing notes.  Pertinent labs & imaging results that were available during my care of the patient were reviewed by me and considered in my medical decision making (see chart for details).     Remy Wille Aubuchon is an 83 year old male with history of high cholesterol,  diabetes, hypertension who presents to the ED with multiple complaints including foot numbness, intermittent chest pain and dizziness and concern for low heart rate. Patient's vitals at time of arrival to the ED are significant for hypertension and bradycardia at 53 bpm. Patient with EKG done in triage that shows sinus bradycardia with first-degree heart block but no signs of ischemic changes. Patient states that he started propranolol about 2-3 weeks ago for blood pressure and heart rate dropped to the 30s. Patient then cut his propranolol dose in half and continued to have heart rate in the 40s with intermittent dizziness and chest pain. Patient has stopped propranolol over the last several days but continues to have intermittent dizziness and intermittent chest pain. Patient states that when he is active he does not usually feel chest pain. He is slightly anxious about his health including bilateral foot numbness and tingling. Patient was recently started on gabapentin for diabetic neuropathy. Patient without chest pain currently but has multiple cardiac risk factors and has a heart score 5. Patient with overall unremarkable exam. Patient has normal sensation in his feet and history and physical is consistent with diabetic neuropathy. Patient can continue on gabapentin. Patient evaluated with CBC, BMP, troponin, chest x-ray.  Patient with no significant anemia or electrolyte abnormalities. Patient with unremarkable chest x-ray with no signs of pneumonia, pneumothorax, pleural effusion. First troponin within normal limits. Concern for possible symptomatic bradycardia versus ACS. Patient with overall unremarkable workup. Believe patient will need stress test for chest pain rule out given heart score of 5. Doubt PE, Wells criteria neg. However, patient wants to leave [place]. Patient does not want to stay for repeat troponin or for stress test. Patient has capacity to make this decision and  understands the risks and benefits of staying versus leaving. Patient decided to leave [place] and was discharged from the ED.  Final Clinical Impressions(s) / ED Diagnoses   Final diagnoses:  Sore throat  Foot pain, bilateral  Chest pain, unspecified type  Heart block, first degree    New Prescriptions Discharge Medication List as of 11/17/2016  5:30 PM       Lennice Sites, DO 11/17/16 2336    Tegeler, Gwenyth Allegra, MD 11/25/16 [phone]

## 2016-11-17 NOTE — Telephone Encounter (Signed)
Spoke with Dr Ronnald Ramp and patient is currently in the office with heart rate in 40's and symptomatic. Patient gets dizzy when he stands. Per Dr Ronnald Ramp patient felt like he needed to go to ED secondary to feeling so bad. Discussed with Dr Oval Linsey and she agrees with sending patient to ED. Advised Trish patient would be coming Patient has NOT been seen by [facility] I the past

## 2016-11-17 NOTE — ED Notes (Signed)
Pt left AMA after discussing treatment plan with EDP. RN advised pt to remain however pt was determined to leave. No IV placed during this visit. Pt given discharge instructions provided by EDP prior to leaving. Pt was taken to lobby in wheelchair where pt ambulated from chair to exit.

## 2016-11-17 NOTE — ED Notes (Signed)
Pt wheeled to room from waiting area. Pt assisted into gown and onto monitor.

## 2016-11-20 ENCOUNTER — Telehealth: Payer: Self-pay | Admitting: Cardiovascular Disease

## 2016-11-20 NOTE — Telephone Encounter (Signed)
Dr. Ronnald Ramp called in concerning this patient. He stated that on 5/21 he sent the patient to the ED with low heart rates and high blood pressures and was informed that the cardmaster would be informed of his arrival. Unfortunately the patient was sent back home the same day.  Dr. Ronnald Ramp stated that the patient was seen today and still has low heart rates in the 40's and blood pressure running 190/85 and 191/71. He would like for the patient to be seen here as soon as possible. Tried calling the patient but the phone number has been disconnected. Called Dr. Ronnald Ramp office and was informed that the phone number is [phone] or [phone] There was no answer and a message was not left due to not being sure this was the patient. Will route to scheduling to call back tomorrow and to try and set up an appointment.

## 2016-11-21 NOTE — Telephone Encounter (Signed)
Pt was contacted and given an appointment with Dr Quay Burow on 11-26-16.

## 2016-11-26 ENCOUNTER — Encounter: Payer: Self-pay | Admitting: Cardiovascular Disease

## 2016-11-26 ENCOUNTER — Ambulatory Visit (INDEPENDENT_AMBULATORY_CARE_PROVIDER_SITE_OTHER): Payer: Commercial Managed Care - HMO | Admitting: Cardiovascular Disease

## 2016-11-26 DIAGNOSIS — R001 Bradycardia, unspecified: Secondary | ICD-10-CM | POA: Diagnosis not present

## 2016-11-26 DIAGNOSIS — I10 Essential (primary) hypertension: Secondary | ICD-10-CM

## 2016-11-26 NOTE — Assessment & Plan Note (Signed)
History of hypertension blood pressure measured today at 134/80. He is on amlodipine. Continue current meds at current dosing

## 2016-11-26 NOTE — Progress Notes (Signed)
11/26/2016 Matin Mattioli   1933-05-20  [id number]  Primary Physician Jilda Panda, MD Primary Cardiologist: Lorretta Harp MD Renae Gloss  HPI:  [name] is a delightful 83 year old mildly overweight divorced Caucasian male father of 2 children (daughter lives in [place]), grandfather 4 grandchildren who is referred by Dr. Kristie Cowman for cardiovascular evaluation because of bradycardia. His cardiovascular risk factors are only notable for treated hypertension. He has never had a heart attack or stroke. He denies chest pain or shortness of breath. He is originally from [place] is a PhD in education. 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 He was recently begun on propranolol for unclear reasons (potentially for benign essential tremor) which resulted in symptomatic bradycardia. He was seen in the emergency room for dizziness and bradycardia. He gets occasional atypical chest pain that occurs every several months. He is otherwise fairly healthy.  Current Outpatient Prescriptions  Medication Sig Dispense Refill  . amLODipine (NORVASC) 5 MG tablet Take 5 mg by mouth daily.  1  . buPROPion (WELLBUTRIN XL) 300 MG 24 hr tablet Take 300 mg by mouth daily.  6  . fish oil-omega-3 fatty acids 1000 MG capsule Take 1 g by mouth daily.      Marland Kitchen l-methylfolate-B6-B12 (METANX) 3-35-2 MG TABS Take 1 tablet by mouth 2 (two) times daily.      Marland Kitchen LORazepam (ATIVAN) 0.5 MG tablet Take 0.5 mg by mouth 2 (two) times daily.     . Multiple Vitamin (MULITIVITAMIN WITH MINERALS) TABS Take 1 tablet by mouth daily.      . Multiple Vitamins-Minerals (ICAPS PO) Take 1 tablet by mouth daily.    . primidone (MYSOLINE) 50 MG tablet Take 50 mg by mouth 2 (two) times daily.    . temazepam (RESTORIL) 15 MG capsule Take 30 mg by mouth at bedtime. For sleep.      No current facility-administered medications for this visit.     No Known Allergies  Social  History   Social History  . Marital status: Divorced    Spouse name: N/A  . Number of children: N/A  . Years of education: N/A   Occupational History  . Not on file.   Social History Main Topics  . Smoking status: Never Smoker  . Smokeless tobacco: Never Used  . Alcohol use No  . Drug use: No  . Sexual activity: Not on file   Other Topics Concern  . Not on file   Social History Narrative  . No narrative on file     Review of Systems: General: negative for chills, fever, night sweats or weight changes.  Cardiovascular: negative for chest pain, dyspnea on exertion, edema, orthopnea, palpitations, paroxysmal nocturnal dyspnea or shortness of breath Dermatological: negative for rash Respiratory: negative for cough or wheezing Urologic: negative for hematuria Abdominal: negative for nausea, vomiting, diarrhea, bright red blood per rectum, melena, or hematemesis Neurologic: negative for visual changes, syncope, or dizziness All other systems reviewed and are otherwise negative except as noted above.    Blood pressure 134/80, pulse 97, height 5\' 8"  (1.727 m), weight 170 lb 3.2 oz (77.2 kg), SpO2 98 %.  General appearance: alert and no distress Neck: no adenopathy, no carotid bruit, no JVD, supple, symmetrical, trachea midline and thyroid not enlarged, symmetric, no tenderness/mass/nodules Lungs: clear to auscultation bilaterally Heart: regular rate and rhythm, S1, S2 normal, no murmur, click, rub or gallop Extremities: extremities normal, atraumatic, no  cyanosis or edema  EKG Not performed today, recent EKG performed by his PCP 11/17/16 revealed sinus rhythm at 62 without ST or T-wave changes. I personally reviewed that EKG.  ASSESSMENT AND PLAN:   ESSENTIAL HYPERTENSION, BENIGN History of hypertension blood pressure measured today at 134/80. He is on amlodipine. Continue current meds at current dosing  Sinus bradycardia Mr. Gotti Alwin was recently started on  propranolol by his PCP which resulted in symptomatic bradycardia. Since this was discontinued he's had no recurrent symptoms. His heart rate is 97. He is otherwise asymptomatic. The patient should avoid beta blockers in the future.      Lorretta Harp MD FACP,FACC,FAHA, [facility] 11/26/2016 8:48 AM

## 2016-11-26 NOTE — Patient Instructions (Signed)
Your physician recommends that you schedule a follow-up appointment as needed with Dr. Berry   

## 2016-11-26 NOTE — Assessment & Plan Note (Signed)
[name] was recently started on propranolol by his PCP which resulted in symptomatic bradycardia. Since this was discontinued he's had no recurrent symptoms. His heart rate is 97. He is otherwise asymptomatic. The patient should avoid beta blockers in the future.

## 2017-03-26 ENCOUNTER — Other Ambulatory Visit: Payer: Self-pay | Admitting: Family Medicine

## 2017-03-26 DIAGNOSIS — R600 Localized edema: Secondary | ICD-10-CM

## 2017-04-03 ENCOUNTER — Ambulatory Visit
Admission: RE | Admit: 2017-04-03 | Discharge: 2017-04-03 | Disposition: A | Discharge status: Home / Self Care | Payer: Medicare HMO | Source: Ambulatory Visit | Attending: Family Medicine | Admitting: Family Medicine

## 2017-04-03 DIAGNOSIS — R600 Localized edema: Secondary | ICD-10-CM

## 2017-04-03 MED ORDER — IOPAMIDOL (ISOVUE-370) INJECTION 76%
75.0000 mL | Freq: Once | INTRAVENOUS | Status: AC | PRN
  Administered 2017-04-03: 75 mL via INTRAVENOUS

## 2017-04-08 ENCOUNTER — Ambulatory Visit
Admission: RE | Admit: 2017-04-08 | Discharge: 2017-04-08 | Disposition: A | Discharge status: Home / Self Care | Payer: Medicare HMO | Source: Ambulatory Visit | Attending: Family Medicine | Admitting: Family Medicine

## 2017-04-08 DIAGNOSIS — R600 Localized edema: Secondary | ICD-10-CM

## 2017-04-08 MED ORDER — GADOBENATE DIMEGLUMINE 529 MG/ML IV SOLN
15.0000 mL | Freq: Once | INTRAVENOUS | Status: AC | PRN
  Administered 2017-04-08: 15 mL via INTRAVENOUS

## 2018-04-12 ENCOUNTER — Other Ambulatory Visit: Payer: Self-pay | Admitting: Family Medicine

## 2018-04-12 DIAGNOSIS — I82422 Acute embolism and thrombosis of left iliac vein: Secondary | ICD-10-CM

## 2018-04-15 ENCOUNTER — Ambulatory Visit
Admission: RE | Admit: 2018-04-15 | Discharge: 2018-04-15 | Disposition: A | Discharge status: Home / Self Care | Payer: Medicare HMO | Source: Ambulatory Visit | Attending: Family Medicine | Admitting: Family Medicine

## 2018-04-15 ENCOUNTER — Ambulatory Visit: Payer: Medicare HMO

## 2018-04-15 DIAGNOSIS — I82422 Acute embolism and thrombosis of left iliac vein: Secondary | ICD-10-CM

## 2018-04-15 MED ORDER — IOPAMIDOL (ISOVUE-370) INJECTION 76%
75.0000 mL | Freq: Once | INTRAVENOUS | Status: AC | PRN
  Administered 2018-04-15: 75 mL via INTRAVENOUS

## 2018-05-07 ENCOUNTER — Encounter: Payer: Self-pay | Admitting: Neurology

## 2018-07-20 IMAGING — MR MR ANKLE*L* WO/W CM
13 of 24 series · 23 of 40 positions shown · IV contrast (multihance)
Comparison: None.

CLINICAL DATA: Left lower leg, ankle and foot swelling for 3
months. Numbness in several toes of the left foot. No known injury.

EXAM:
MRI OF THE LEFT ANKLE WITHOUT AND WITH CONTRAST; MRI OF LOWER LEFT
EXTREMITY WITHOUT AND WITH CONTRAST; MRI OF THE LEFT FOREFOOT
WITHOUT AND WITH CONTRAST
TECHNIQUE: Multiplanar, multisequence MR imaging of the ankle was performed
before and after the administration of intravenous contrast.
CONTRAST:  15 ml MULTIHANCE GADOBENATE DIMEGLUMINE 529 MG/ML IV SOLN

[Series 4: T1 · axial · 5.0mm · 0.42mm/px · z∈[-212,+118]mm · 4 of 54 slices shown (1 of 3)]
[im 1/54]
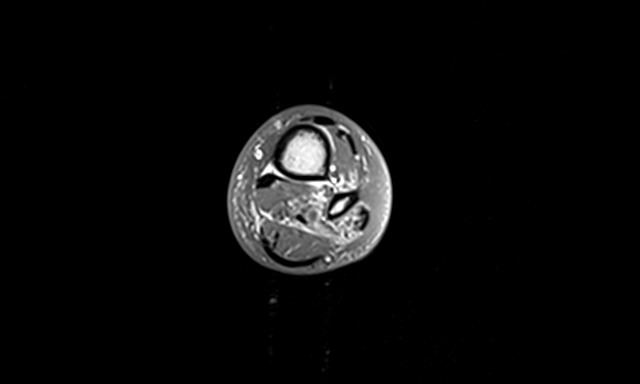
[im 18/54]
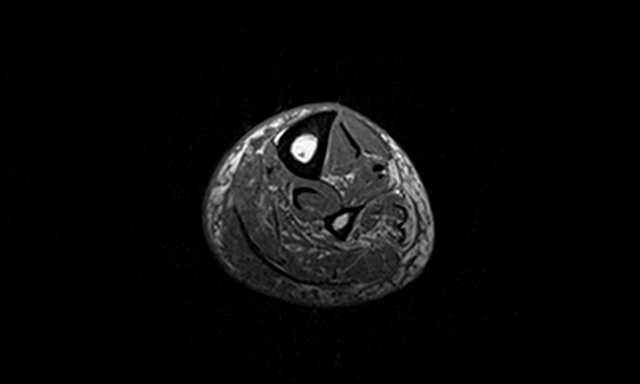
[im 36/54]
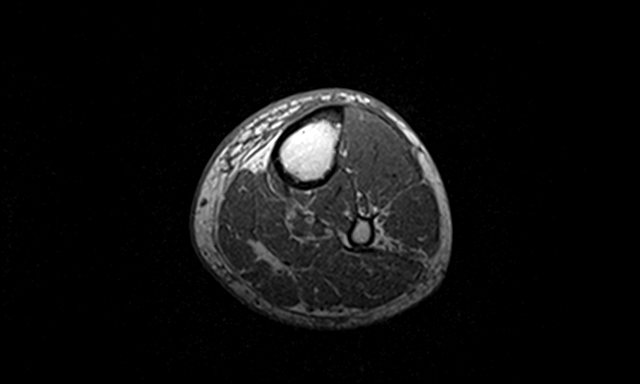
[im 54/54]
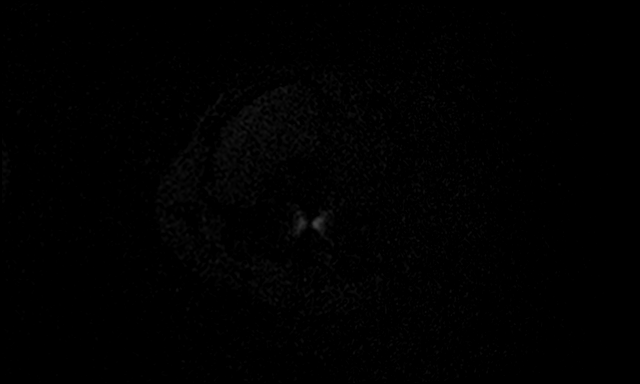

[Series 5: T2 fat-sat · axial · 5.0mm · 0.88mm/px · z∈[-212,+118]mm · 3 of 54 slices shown (1 of 8)]
[im 1/54]
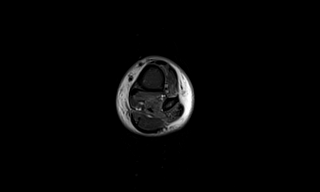
[im 27/54]
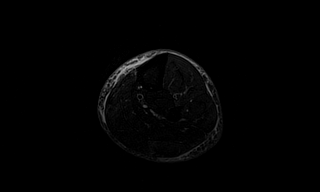
[im 54/54]
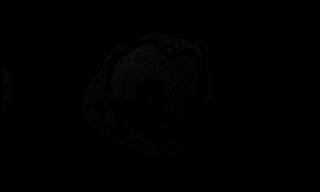

[Series 6: T1 · sagittal · 5.0mm · 0.53mm/px · 1 of 21 slices shown (2 of 3)]
[im 1/21]
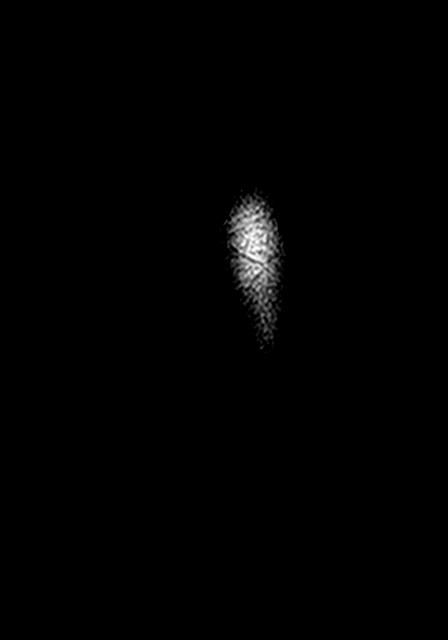

[Series 7: T1 · coronal · 5.0mm · 0.52mm/px · 1 of 19 slices shown (3 of 3)]
[im 1/19]
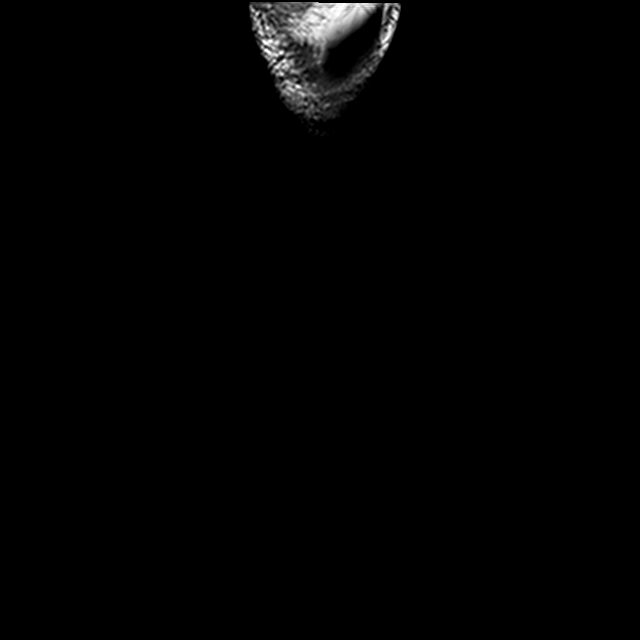

[Series 8: T2 fat-sat · coronal · 5.0mm · 1.03mm/px · 1 of 19 slices shown (2 of 8)]
[im 1/19]
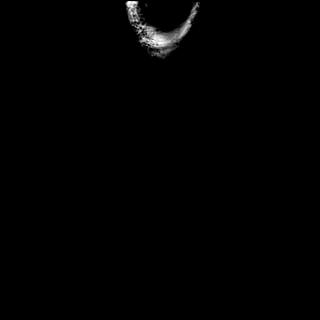

[Series 9: T1 fat-sat · axial · 5.0mm · 0.42mm/px · z∈[-212,+118]mm · 3 of 54 slices shown]
[im 1/54]
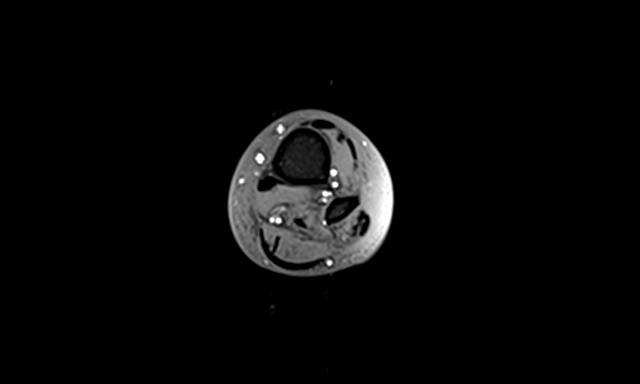
[im 27/54]
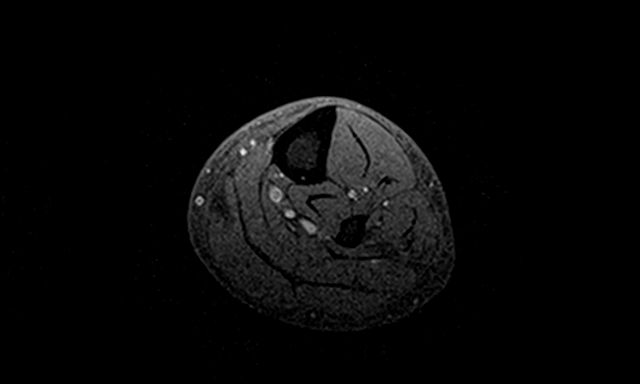
[im 54/54]
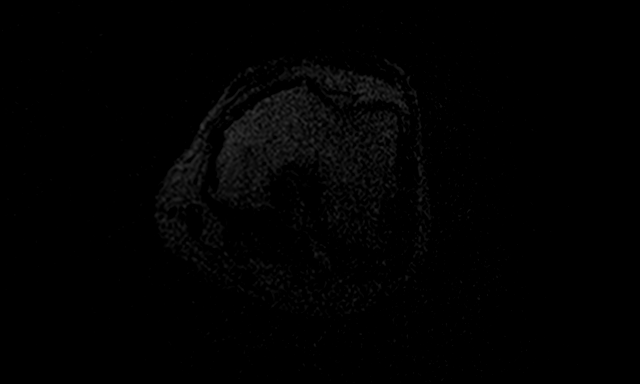

[Series 15: T2 fat-sat · coronal · 4.0mm · 0.33mm/px · 2 of 29 slices shown (3 of 8)]
[im 1/29]
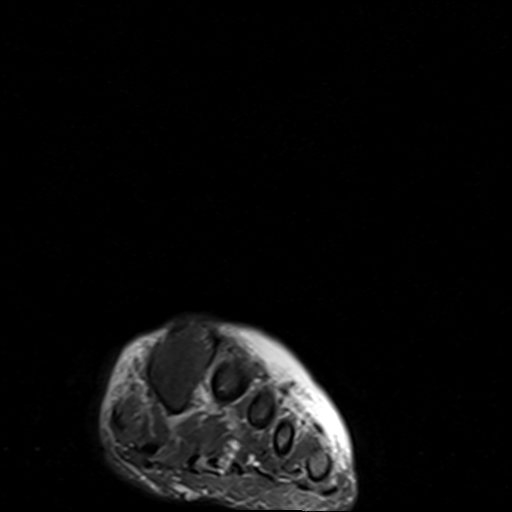
[im 29/29]
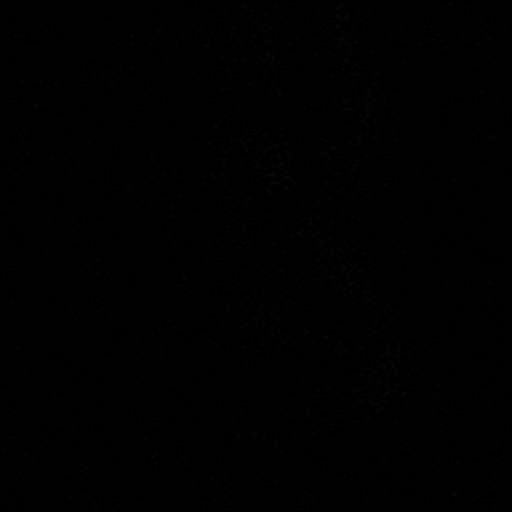

[Series 17: T2 fat-sat · sagittal · 3.0mm · 0.33mm/px · 1 of 22 slices shown (4 of 8)]
[im 1/22]
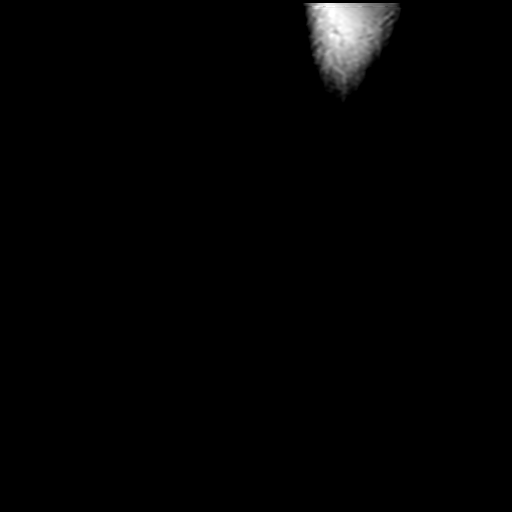

[Series 18: T2 fat-sat · axial · 4.0mm · 0.53mm/px · 1 of 26 slices shown (5 of 8)]
[im 1/26]
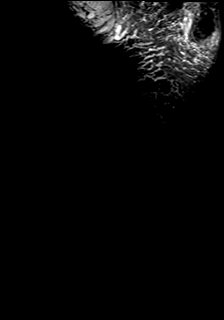

[Series 19: PD fat-sat · axial · 4.0mm · 0.53mm/px · 1 of 26 slices shown]
[im 1/26]
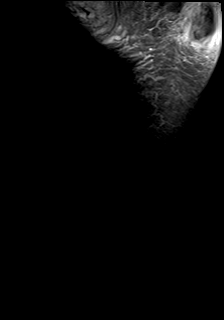

[Series 23: T2 fat-sat · coronal · 4.0mm · 0.27mm/px · 2 of 32 slices shown (6 of 8)]
[im 1/32]
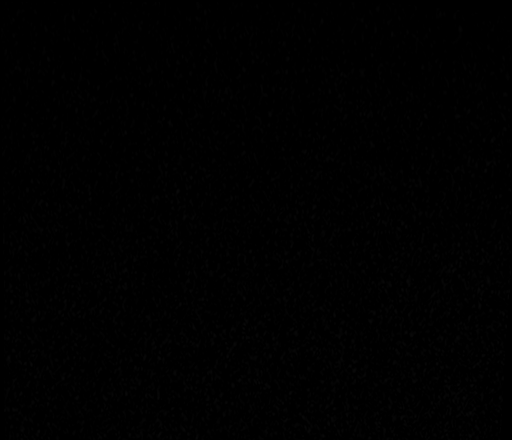
[im 32/32]
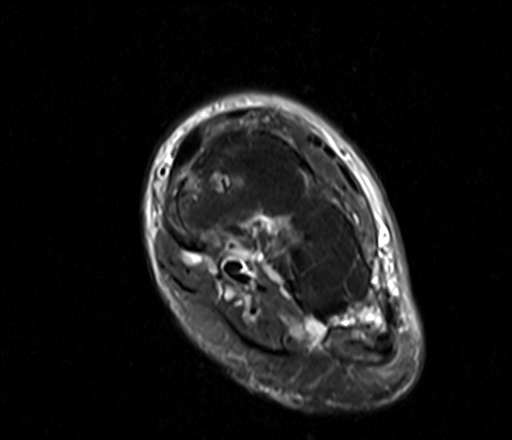

[Series 26: T2 fat-sat · axial · 4.0mm · 0.33mm/px · 1 of 23 slices shown (7 of 8)]
[im 1/23]
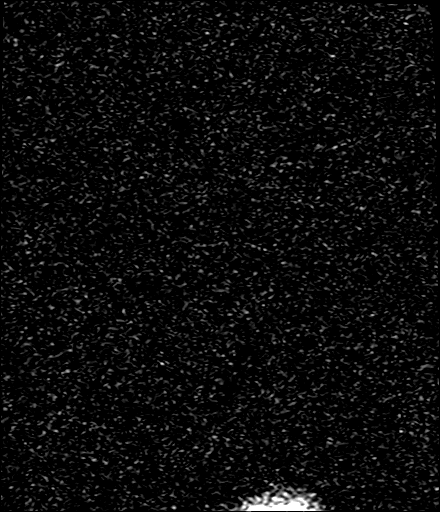

[Series 27: T2 fat-sat · sagittal · 3.0mm · 0.33mm/px · 2 of 29 slices shown (8 of 8)]
[im 1/29]
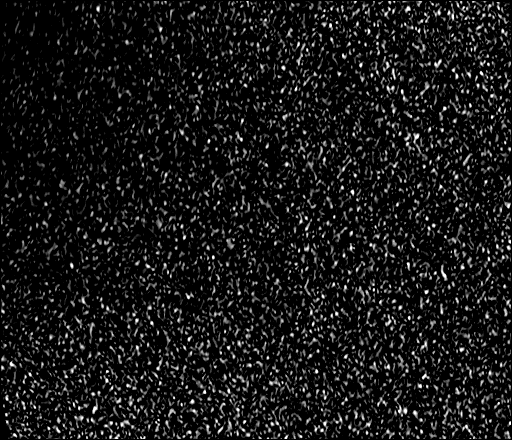
[im 29/29]
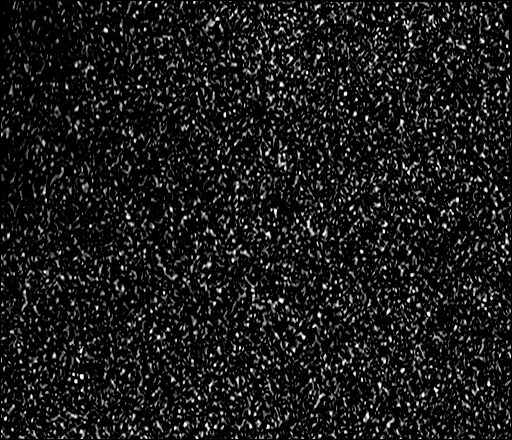

[23 of 40 positions shown; findings below may reference images not displayed]

FINDINGS: LEFT LOWER LEG:

Bone marrow signal is normal throughout without fracture, stress
change or focal lesion. All imaged musculature is intact and normal
in appearance without tear, strain, fluid collection or mass.
Subcutaneous tissues demonstrate edema which increases in intensity
more distally along the lower leg.

ANKLE:

TENDONS

Peroneal: Intact.

Posteromedial: Intact.

Anterior: Intact.

Achilles: Intact.

Plantar Fascia: Appears normal.

LIGAMENTS

Lateral: Intact.

Medial: Intact.

CARTILAGE

Ankle Joint: Appears normal.

Subtalar Joints/Sinus Tarsi: Appear normal.

Bones: Subchondral cyst formation is seen about the articulation of
the navicular and medial cuneiform consistent with osteoarthritis.
No fracture, stress change or worrisome lesion.

Other: Subcutaneous edema is present about the ankle. No focal fluid
collection.

LEFT FOOT:

The patient has mild first MTP osteoarthritis with small osteophytes
and minimal subchondral cyst formation about the joint. Small focus
of susceptibility artifact in medial soft tissues of the great toe
is compatible with the presence of a foreign body, possibly
microscopic. Marrow edema is noted in the lateral sesamoid bone. No
focal fluid collection or mass is identified. All intrinsic
musculature and tendons of the foot are intact. No focal fluid
collection or mass.
IMPRESSION: IMPRESSION
Subcutaneous edema about the lower leg, ankle and foot could be due
to dependent change or cellulitis.

Midfoot osteoarthritis most notable at the articulation of the
navicular and medial cuneiform.

Mild first MTP osteoarthritis.

Marrow edema in the lateral sesamoid bone compatible with sesamoid
dysfunction.

Susceptibility artifact in the soft tissues of the distal great toe
is compatible with the presence of a foreign body, possibly
microscopic.

## 2018-07-26 ENCOUNTER — Ambulatory Visit: Payer: Medicare HMO | Admitting: Neurology

## 2018-07-26 ENCOUNTER — Encounter: Payer: Self-pay | Admitting: Neurology

## 2018-07-26 VITALS — BP 120/80 | HR 89 | Ht 67.0 in | Wt 174.2 lb

## 2018-07-26 DIAGNOSIS — G250 Essential tremor: Secondary | ICD-10-CM

## 2018-07-26 DIAGNOSIS — R29898 Other symptoms and signs involving the musculoskeletal system: Secondary | ICD-10-CM | POA: Diagnosis not present

## 2018-07-26 DIAGNOSIS — R208 Other disturbances of skin sensation: Secondary | ICD-10-CM | POA: Diagnosis not present

## 2018-07-26 DIAGNOSIS — M5416 Radiculopathy, lumbar region: Secondary | ICD-10-CM | POA: Diagnosis not present

## 2018-07-26 DIAGNOSIS — R200 Anesthesia of skin: Secondary | ICD-10-CM

## 2018-07-26 NOTE — Patient Instructions (Signed)
We will order MRI lumbar spine and call you with the results

## 2018-07-26 NOTE — Progress Notes (Signed)
[facility] Clinic Note - Initial Visit   Date: 07/26/18  [name] MRN: [id number] DOB: 07-01-1932   Dear Simona Huh, NP:  Thank you for your kind referral of [name] for consultation of left foot numbness. Although his history is well known to you, please allow [place] to reiterate it for the purpose of our medical record. The patient was accompanied to the clinic by self.    History of Present Illness: [name] is a 85 y.o. right-handed male with diabetes mellitus, essential tremor, depression/anxiety, hyperlipidemia, insomnia,  presenting for evaluation of left foot numbness.  Starting around the fall of 2019, he began having numbness and swelling of the left foot.  Symptoms are constant and involves the top of his left foot.  He has tingling over the soles of the feet.  He has some imbalance, no falls and he walks unassisted.  He also complains of achy low back pain.  Prolonged standing or walking does not exacerbate his numbness.  He does not have similar symptoms on the right foot.   Out-side paper records, electronic medical record, and images have been reviewed where available and summarized as:  MRI lumbar spine 09/08/2012: 1.  Chronic facet disease in the lower lumbar spine may contribute to axial back pain.  No acute osseous findings or malalignment identified. 2.  Right paracentral disc extrusion at L2-L3 with resulting right lateral recess stenosis and possible right L3 nerve root encroachment. 3.  Shallow right paracentral disc protrusion at L3-L4 contributes to mild central and right greater than left lateral recess stenosis.  There is asymmetric foraminal stenosis on the left due to facet disease. 4.  Chronic disc degeneration and facet disease at L4-L5 contributing to mild right greater than left lateral recess and foraminal stenosis.  MRI brain 02/15/2007:  No acute intracranial abnormality and no  evidence of infarct or mass. Paranasal chronic sinusitis and right mastoiditis.    Past Medical History:  Diagnosis Date  . Anxiety   . Cataract   . Depression   . Diabetes mellitus   . GERD (gastroesophageal reflux disease)   . Hyperlipemia   . Hypertension     Past Surgical History:  Procedure Laterality Date  . FRACTURE SURGERY    . HERNIA REPAIR       Medications:  Outpatient Encounter Medications as of 07/26/2018  Medication Sig  . amLODipine (NORVASC) 5 MG tablet Take 5 mg by mouth daily.  Marland Kitchen aspirin 81 MG chewable tablet   . buPROPion (WELLBUTRIN XL) 300 MG 24 hr tablet Take 300 mg by mouth daily.  . fish oil-omega-3 fatty acids 1000 MG capsule Take 1 g by mouth daily.    Marland Kitchen gabapentin (NEURONTIN) 300 MG capsule   . l-methylfolate-B6-B12 (METANX) 3-35-2 MG TABS Take 1 tablet by mouth 2 (two) times daily.    Marland Kitchen lisinopril (PRINIVIL,ZESTRIL) 2.5 MG tablet   . LORazepam (ATIVAN) 0.5 MG tablet Take 0.5 mg by mouth 2 (two) times daily.   . meloxicam (MOBIC) 7.5 MG tablet Take by mouth.  . Multiple Vitamin (MULITIVITAMIN WITH MINERALS) TABS Take 1 tablet by mouth daily.    . Multiple Vitamins-Minerals (ICAPS PO) Take 1 tablet by mouth daily.  . primidone (MYSOLINE) 50 MG tablet Take 50 mg by mouth 2 (two) times daily.  . tamsulosin (FLOMAX) 0.4 MG CAPS capsule Take by mouth.  . temazepam (RESTORIL) 15 MG capsule Take 30 mg by mouth at bedtime. For sleep.   Marland Kitchen  temazepam (RESTORIL) 30 MG capsule Take by mouth.   No facility-administered encounter medications on file as of 07/26/2018.      Allergies: No Known Allergies  Family History: Family History  Problem Relation Age of Onset  . Lung cancer Brother     Social History: Social History   Tobacco Use  . Smoking status: Never Smoker  . Smokeless tobacco: Never Used  Substance Use Topics  . Alcohol use: No  . Drug use: No   Social History   Social History Narrative   Lives in a senior center.  Has 2  children.  Retired Pension scheme manager at [employer].  Education; PhD.    Review of Systems:  CONSTITUTIONAL: No fevers, chills, night sweats, or weight loss.   EYES: No visual changes or eye pain ENT: No hearing changes.  No history of nose bleeds.   RESPIRATORY: No cough, wheezing and shortness of breath.   CARDIOVASCULAR: Negative for chest pain, and palpitations.   GI: Negative for abdominal discomfort, blood in stools or black stools.  No recent change in bowel habits.   GU:  No history of incontinence.   MUSCLOSKELETAL: No history of joint pain or swelling.  No myalgias.   SKIN: Negative for lesions, rash, and itching.   HEMATOLOGY/ONCOLOGY: Negative for prolonged bleeding, bruising easily, and swollen nodes.  +history of cancer.   ENDOCRINE: Negative for cold or heat intolerance, polydipsia or goiter.   PSYCH:  No depression or anxiety symptoms.   NEURO: As Above.   Vital Signs:  BP 120/80   Pulse 89   Ht 5\' 7"  (1.702 m)   Wt 174 lb 4 oz (79 kg)   SpO2 97%   BMI 27.29 kg/m  General Medical Exam:   General:  Well appearing, comfortable.   Eyes/ENT: see cranial nerve examination.   Neck: No masses appreciated.  Full range of motion without tenderness.  No carotid bruits. Respiratory:  Clear to auscultation, good air entry bilaterally.   Cardiac:  Regular rate and rhythm, no murmur.   Extremities:  No deformities, edema, or skin discoloration.  Skin:  No rashes or lesions.  Neurological Exam: MENTAL STATUS including orientation to time, place, person, recent and remote memory, attention span and concentration, language, and fund of knowledge is normal.  Speech is not dysarthric.  CRANIAL NERVES: II:  No visual field defects.  Unremarkable fundi.   III-IV-VI: Pupils equal round and reactive to light.  Normal conjugate, extra-ocular eye movements in all directions of gaze.  No nystagmus.  No ptosis.   V:  Normal facial sensation.    VII:  Normal facial symmetry and movements.     VIII:  Normal hearing and vestibular function.   IX-X:  Normal palatal movement.   XI:  Normal shoulder shrug and head rotation.   XII:  Normal tongue strength and range of motion, no deviation or fasciculation.  MOTOR:  No atrophy or fasciculations.  Moderate intetion amplitude tremor of the hands.  No pronator drift.  Tone is normal.    Right Upper Extremity:    Left Upper Extremity:    Deltoid  5/5   Deltoid  5/5   Biceps  5/5   Biceps  5/5   Triceps  5/5   Triceps  5/5   Wrist extensors  5/5   Wrist extensors  5/5   Wrist flexors  5/5   Wrist flexors  5/5   Finger extensors  5/5   Finger extensors  5/5   Finger  flexors  5/5   Finger flexors  5/5   Dorsal interossei  5/5   Dorsal interossei  5/5   Abductor pollicis  5/5   Abductor pollicis  5/5   Tone (Ashworth scale)  0  Tone (Ashworth scale)  0   Right Lower Extremity:    Left Lower Extremity:    Hip flexors  5/5   Hip flexors  5/5   Hip extensors  5/5   Hip extensors  5/5   Knee flexors  5/5   Knee flexors  5/5   Knee extensors  5/5   Knee extensors  5/5   Dorsiflexors  5/5   Dorsiflexors  5-/5   Plantarflexors  5/5   Plantarflexors  5/5   Inversion 5/5  Inversion 5-/5  Eversion 5/5  Eversion 5-/5  Toe extensors  5/5   Toe extensors  5-/5   Toe flexors  5/5   Toe flexors  5/5   Tone (Ashworth scale)  0  Tone (Ashworth scale)  0   MSRs:  Right                                                                 Left brachioradialis 2+  brachioradialis 2+  biceps 2+  biceps 2+  triceps 2+  triceps 2+  patellar 2+  patellar 2+  ankle jerk 1+  ankle jerk 1+  Hoffman no  Hoffman no  plantar response down  plantar response down   SENSORY:  Vibration is markedly reduced at the right ankle, as compared to the left.  Pin prick, temperature, and light touch is normal throughout, including distally over the feet.  Romberg's sign absent.   COORDINATION/GAIT: Normal finger-to- nose-finger.  Intact rapid alternating movements  bilaterally.   Gait narrow based and stable. Stressed gait intact   IMPRESSION: 1.  Left foot paresthesias and mild weakness with dorsiflexion, eversion, and inversion raises concern for L5 radiculopathy.  MRI lumbar spine will be ordered to look for structural pathology.  Consider NCS/EMG going forward to look for peroneal neuropathy, however with inversion involved and paresthesias sparing the lateral leg, symptoms correlate moreso with L5 nerve root. Further recommendations pending results.  2.  Essential tremor of the hands.  Continue primidone 50mg  twice daily.   Thank you for allowing me to participate in patient's care.  If I can answer any additional questions, I would be pleased to do so.    Sincerely,    Coralyn Roselli K. Posey Pronto, DO

## 2018-08-12 ENCOUNTER — Ambulatory Visit
Admission: RE | Admit: 2018-08-12 | Discharge: 2018-08-12 | Disposition: A | Discharge status: Home / Self Care | Payer: Medicare HMO | Source: Ambulatory Visit | Attending: Neurology | Admitting: Neurology

## 2018-08-12 DIAGNOSIS — R200 Anesthesia of skin: Secondary | ICD-10-CM

## 2018-08-12 DIAGNOSIS — R29898 Other symptoms and signs involving the musculoskeletal system: Secondary | ICD-10-CM

## 2018-08-12 DIAGNOSIS — R208 Other disturbances of skin sensation: Secondary | ICD-10-CM

## 2018-08-12 DIAGNOSIS — M5416 Radiculopathy, lumbar region: Secondary | ICD-10-CM

## 2018-08-17 ENCOUNTER — Telehealth: Payer: Self-pay | Admitting: *Deleted

## 2018-08-17 NOTE — Telephone Encounter (Signed)
Left message giving patient results and requested for him to call if he would like to do the NCS/EMG.

## 2018-08-17 NOTE — Telephone Encounter (Signed)
-----   Message from Alda Berthold, DO sent at 08/13/2018  4:22 PM EST ----- Please inform patient that his MRI shows arthritis at several levels in his low back, but there is no severe nerve impingement to cause his left foot numbness/weakness, recommend NCS/EMGof the left > right leg to look into his symptoms more. Thanks.

## 2018-08-25 ENCOUNTER — Telehealth: Payer: Self-pay | Admitting: Neurology

## 2018-08-25 NOTE — Telephone Encounter (Signed)
Megan from [facility] was calling about additional testing that was supposedly needed on this patient after MRI and she was trying to find out what that test was. Please call her back at [phone]. Thanks!

## 2018-08-31 NOTE — Telephone Encounter (Signed)
Left message for Jinny Blossom to call me back.

## 2018-11-29 ENCOUNTER — Other Ambulatory Visit: Payer: Self-pay | Admitting: Nurse Practitioner

## 2018-11-29 ENCOUNTER — Other Ambulatory Visit (HOSPITAL_COMMUNITY): Payer: Self-pay | Admitting: Nurse Practitioner

## 2018-11-29 DIAGNOSIS — M5136 Other intervertebral disc degeneration, lumbar region: Secondary | ICD-10-CM

## 2018-11-30 ENCOUNTER — Other Ambulatory Visit (HOSPITAL_COMMUNITY): Payer: Self-pay | Admitting: Nurse Practitioner

## 2018-11-30 DIAGNOSIS — M5136 Other intervertebral disc degeneration, lumbar region: Secondary | ICD-10-CM

## 2018-11-30 DIAGNOSIS — M7989 Other specified soft tissue disorders: Secondary | ICD-10-CM

## 2018-12-06 ENCOUNTER — Ambulatory Visit (HOSPITAL_COMMUNITY): Payer: Medicare HMO

## 2018-12-06 ENCOUNTER — Other Ambulatory Visit: Payer: Self-pay

## 2018-12-06 ENCOUNTER — Ambulatory Visit (HOSPITAL_COMMUNITY)
Admission: RE | Admit: 2018-12-06 | Discharge: 2018-12-06 | Disposition: A | Discharge status: Home / Self Care | Payer: Medicare HMO | Source: Ambulatory Visit | Attending: Nurse Practitioner | Admitting: Nurse Practitioner

## 2018-12-06 DIAGNOSIS — M5136 Other intervertebral disc degeneration, lumbar region: Secondary | ICD-10-CM | POA: Diagnosis present

## 2019-01-26 ENCOUNTER — Other Ambulatory Visit: Payer: Self-pay

## 2019-01-26 ENCOUNTER — Telehealth (INDEPENDENT_AMBULATORY_CARE_PROVIDER_SITE_OTHER): Payer: Medicare HMO | Admitting: Neurology

## 2019-01-26 DIAGNOSIS — R208 Other disturbances of skin sensation: Secondary | ICD-10-CM | POA: Diagnosis not present

## 2019-01-26 DIAGNOSIS — R202 Paresthesia of skin: Secondary | ICD-10-CM

## 2019-01-26 DIAGNOSIS — G250 Essential tremor: Secondary | ICD-10-CM

## 2019-01-26 DIAGNOSIS — R200 Anesthesia of skin: Secondary | ICD-10-CM

## 2019-01-26 NOTE — Progress Notes (Signed)
    Virtual Visit via Telephone Note The purpose of this virtual visit is to provide medical care while limiting exposure to the novel coronavirus.    Consent was obtained for phone visit:  Yes.   Answered questions that patient had about telehealth interaction:  Yes.   I discussed the limitations, risks, security and privacy concerns of performing an evaluation and management service by telephone. I also discussed with the patient that there may be a patient responsible charge related to this service. The patient expressed understanding and agreed to proceed.  Pt location: Home Physician Location: office Name of referring provider:  Simona Huh, NP I connected with .Virgel Haro at patients initiation/request on 01/26/2019 at 10:10 AM EDT by telephone and verified that I am speaking with the correct person using two identifiers.  Pt MRN:  [id number] Pt DOB:  [birth year]   History of Present Illness: This is a 85 year old man returning to the office for left foot numbness.  He was last seen in January for the same complaints and had MRI lumbar spine which shows multilevel degenerative changes, with left foraminal stenosis at L3-4 (severe), bilateral lateral recess stenosis at L2-3 (severe), and right foraminal stenosis at L4-5 (moderate), however no severe nerve impingement at the left L5 level.  He does not report any weakness, imbalance, or falls.  He walks unassisted.  He does complain of left foot swelling and numbness, which is worse of the sole of the foot.  He denies any similar symptoms on the right foot.  He does not cross his legs regularly.  MRI lumbar spine 12/06/2018: 1. Multilevel lumbar spondylosis as described above, similar to prior study. Unchanged moderate left lateral recess and moderate to severe left neuroforaminal stenosis at L3-L4. 2. Unchanged severe right and moderate left lateral recess stenosis at L2-L3. 3. Unchanged moderate right neuroforaminal stenosis  at L4-L5.  Assessment and Plan:   1.  Left foot paresthesias, initially concerning for L5 radiculopathy however left L5 nerve root does not appear to be compressed.  He does have multilevel degenerative changes at other lumbosacral segments. Imaging results were discussed with patient. The next step is electrodiagnostic testing of the left > right leg to help localize his symptoms.  2.  Essential tremor of the hands, stable, continue primidone 50 mg twice daily.  Follow Up Instructions:   I discussed the assessment and treatment plan with the patient. The patient was provided an opportunity to ask questions and all were answered. The patient agreed with the plan and demonstrated an understanding of the instructions.   The patient was advised to call back or seek an in-person evaluation if the symptoms worsen or if the condition fails to improve as anticipated.   Total Time spent in visit with the patient was:  25 min, of which 100% of the time was spent in counseling and/or coordinating care.   Pt understands and agrees with the plan of care outlined.     Alda Berthold, DO

## 2019-04-06 ENCOUNTER — Other Ambulatory Visit: Payer: Self-pay

## 2019-04-06 DIAGNOSIS — R600 Localized edema: Secondary | ICD-10-CM

## 2019-04-11 ENCOUNTER — Telehealth (HOSPITAL_COMMUNITY): Payer: Self-pay | Admitting: *Deleted

## 2019-04-11 NOTE — Telephone Encounter (Signed)

## 2019-04-12 ENCOUNTER — Ambulatory Visit (INDEPENDENT_AMBULATORY_CARE_PROVIDER_SITE_OTHER): Payer: Medicare HMO | Admitting: Vascular Surgery

## 2019-04-12 ENCOUNTER — Other Ambulatory Visit: Payer: Self-pay

## 2019-04-12 ENCOUNTER — Encounter: Payer: Self-pay | Admitting: Vascular Surgery

## 2019-04-12 ENCOUNTER — Ambulatory Visit (HOSPITAL_COMMUNITY)
Admission: RE | Admit: 2019-04-12 | Discharge: 2019-04-12 | Disposition: A | Discharge status: Home / Self Care | Payer: Medicare HMO | Source: Ambulatory Visit | Attending: Vascular Surgery | Admitting: Vascular Surgery

## 2019-04-12 VITALS — BP 139/71 | HR 63 | Temp 97.9°F | Resp 20 | Ht 67.0 in | Wt 183.8 lb

## 2019-04-12 DIAGNOSIS — R600 Localized edema: Secondary | ICD-10-CM

## 2019-04-12 NOTE — Progress Notes (Signed)
[facility]  Patient name: [name] MRN: [id number] DOB: 09/19/1932 Sex: male  REASON FOR CONSULT: Evaluation of lower extremity swelling  HPI: [name] is a 85 y.o. male, who is here today for evaluation.  He reports initially he had swelling in his left leg and this actually resulted in some numbness into his left foot.  He reports that now over the last several weeks he has had significant swelling in his right leg as well.  This is improved overnight and is progressive during the day.  He has attempted to wear compression garments but has had a difficulty applying these.  He has no history of DVT and no history of lower extremity arterial insufficiency.  No history of congestive heart failure  Past Medical History:  Diagnosis Date  . Anxiety   . Cataract   . Depression   . Diabetes mellitus   . GERD (gastroesophageal reflux disease)   . Hyperlipemia   . Hypertension     Family History  Problem Relation Age of Onset  . Lung cancer Brother     SOCIAL HISTORY: Social History   Socioeconomic History  . Marital status: Divorced    Spouse name: Not on file  . Number of children: 2  . Years of education: Not on file  . Highest education level: Doctorate  Occupational History  . Occupation: retired Surveyor, minerals: [employer]  . Financial resource strain: Not on file  . Food insecurity    Worry: Not on file    Inability: Not on file  . Transportation needs    Medical: Not on file    Non-medical: Not on file  Tobacco Use  . Smoking status: Never Smoker  . Smokeless tobacco: Never Used  Substance and Sexual Activity  . Alcohol use: No  . Drug use: No  . Sexual activity: Not on file  Lifestyle  . Physical activity    Days per week: Not on file    Minutes per session: Not on file  . Stress: Not on file  Relationships  . Social  Herbalist on phone: Not on file    Gets together: Not on file    Attends religious service: Not on file    Active member of club or organization: Not on file    Attends meetings of clubs or organizations: Not on file    Relationship status: Not on file  . Intimate partner violence    Fear of current or ex partner: Not on file    Emotionally abused: Not on file    Physically abused: Not on file    Forced sexual activity: Not on file  Other Topics Concern  . Not on file  Social History Narrative   Lives in a senior center.  Has 2 children.  Retired Pension scheme manager at [employer].  Education; PhD.    No Known Allergies  Current Outpatient Medications  Medication Sig Dispense Refill  . amLODipine (NORVASC) 5 MG tablet Take 5 mg by mouth daily.  1  . aspirin 81 MG chewable tablet     . buPROPion (WELLBUTRIN XL) 300 MG 24 hr tablet Take 300 mg by mouth daily.  6  . fish oil-omega-3 fatty acids 1000 MG capsule Take 1 g by mouth daily.      Marland Kitchen gabapentin (NEURONTIN) 300 MG capsule     . l-methylfolate-B6-B12 (METANX) 3-35-2 MG TABS Take  1 tablet by mouth 2 (two) times daily.      Marland Kitchen LORazepam (ATIVAN) 0.5 MG tablet Take 0.5 mg by mouth 2 (two) times daily.     . Multiple Vitamin (MULITIVITAMIN WITH MINERALS) TABS Take 1 tablet by mouth daily.      . primidone (MYSOLINE) 50 MG tablet Take 50 mg by mouth 2 (two) times daily.    . tamsulosin (FLOMAX) 0.4 MG CAPS capsule Take by mouth.    . temazepam (RESTORIL) 30 MG capsule Take by mouth.     No current facility-administered medications for this visit.     REVIEW OF SYSTEMS:  [X]  denotes positive finding, [ ]  denotes negative finding Cardiac  Comments:  Chest pain or chest pressure:    Shortness of breath upon exertion:    Short of breath when lying flat:    Irregular heart rhythm:        Vascular    Pain in calf, thigh, or hip brought on by ambulation:    Pain in feet at night that wakes you up from your sleep:     Blood  clot in your veins:    Leg swelling:  x       Pulmonary    Oxygen at home:    Productive cough:     Wheezing:         Neurologic    Sudden weakness in arms or legs:     Sudden numbness in arms or legs:     Sudden onset of difficulty speaking or slurred speech:    Temporary loss of vision in one eye:     Problems with dizziness:         Gastrointestinal    Blood in stool:     Vomited blood:         Genitourinary    Burning when urinating:     Blood in urine:        Psychiatric    Major depression:         Hematologic    Bleeding problems:    Problems with blood clotting too easily:        Skin    Rashes or ulcers:        Constitutional    Fever or chills:      PHYSICAL EXAM: Vitals:   04/12/19 1257  BP: 139/71  Pulse: 63  Resp: 20  Temp: 97.9 F (36.6 C)  SpO2: 97%  Weight: 183 lb 12.8 oz (83.4 kg)  Height: 5\' 7"  (1.702 m)    GENERAL: The patient is a well-nourished male, in no acute distress. The vital signs are documented above. CARDIOVASCULAR: 2+ radial, 2+ popliteal and 2+ dorsalis pedis pulses bilaterally.  Pitting edema in his legs bilaterally from his mid calves distally.  No changes of chronic venous hypertension on his skin PULMONARY: There is good air exchange  ABDOMEN: Soft and non-tender  MUSCULOSKELETAL: There are no major deformities or cyanosis. NEUROLOGIC: No focal weakness or paresthesias are detected. SKIN: There are no ulcers or rashes noted. PSYCHIATRIC: The patient has a normal affect.  DATA:  Left lower extremity venous duplex today revealed no evidence of reflux in his deep or superficial system.  No evidence of DVT  MEDICAL ISSUES: I have reassured the patient regarding his duplex finding.  It seems that this is more related to fluid retention than to venous pathology.  Also explained that I have no explanation for his left foot numbness.  He will elevate his  legs when possible and discuss potential diuretic treatment with his  medical doctor.  He will see [place] on an as-needed basis   Rosetta Posner, MD [facility] Tel [phone] Pager [phone]

## 2019-07-29 ENCOUNTER — Emergency Department (HOSPITAL_COMMUNITY): Payer: Medicare HMO

## 2019-07-29 ENCOUNTER — Encounter (HOSPITAL_COMMUNITY): Payer: Self-pay

## 2019-07-29 ENCOUNTER — Inpatient Hospital Stay (HOSPITAL_COMMUNITY)
Admission: EM | Admit: 2019-07-29 | Discharge: 2019-08-02 | DRG: 418 | Disposition: A | Discharge status: Home Health | Payer: Medicare HMO | Attending: General Surgery | Admitting: General Surgery

## 2019-07-29 ENCOUNTER — Other Ambulatory Visit: Payer: Self-pay

## 2019-07-29 DIAGNOSIS — J9811 Atelectasis: Secondary | ICD-10-CM | POA: Diagnosis not present

## 2019-07-29 DIAGNOSIS — K810 Acute cholecystitis: Principal | ICD-10-CM | POA: Diagnosis present

## 2019-07-29 DIAGNOSIS — F419 Anxiety disorder, unspecified: Secondary | ICD-10-CM | POA: Diagnosis present

## 2019-07-29 DIAGNOSIS — R490 Dysphonia: Secondary | ICD-10-CM | POA: Diagnosis present

## 2019-07-29 DIAGNOSIS — Z79899 Other long term (current) drug therapy: Secondary | ICD-10-CM | POA: Diagnosis not present

## 2019-07-29 DIAGNOSIS — N400 Enlarged prostate without lower urinary tract symptoms: Secondary | ICD-10-CM | POA: Diagnosis present

## 2019-07-29 DIAGNOSIS — Z20822 Contact with and (suspected) exposure to covid-19: Secondary | ICD-10-CM | POA: Diagnosis present

## 2019-07-29 DIAGNOSIS — R0902 Hypoxemia: Secondary | ICD-10-CM | POA: Diagnosis not present

## 2019-07-29 DIAGNOSIS — F329 Major depressive disorder, single episode, unspecified: Secondary | ICD-10-CM | POA: Diagnosis present

## 2019-07-29 DIAGNOSIS — K219 Gastro-esophageal reflux disease without esophagitis: Secondary | ICD-10-CM | POA: Diagnosis present

## 2019-07-29 DIAGNOSIS — Z7982 Long term (current) use of aspirin: Secondary | ICD-10-CM | POA: Diagnosis not present

## 2019-07-29 DIAGNOSIS — K589 Irritable bowel syndrome without diarrhea: Secondary | ICD-10-CM | POA: Diagnosis present

## 2019-07-29 DIAGNOSIS — Z801 Family history of malignant neoplasm of trachea, bronchus and lung: Secondary | ICD-10-CM

## 2019-07-29 DIAGNOSIS — C61 Malignant neoplasm of prostate: Secondary | ICD-10-CM | POA: Diagnosis present

## 2019-07-29 DIAGNOSIS — E119 Type 2 diabetes mellitus without complications: Secondary | ICD-10-CM | POA: Diagnosis present

## 2019-07-29 DIAGNOSIS — G250 Essential tremor: Secondary | ICD-10-CM | POA: Diagnosis present

## 2019-07-29 DIAGNOSIS — K82A1 Gangrene of gallbladder in cholecystitis: Secondary | ICD-10-CM | POA: Diagnosis present

## 2019-07-29 DIAGNOSIS — R0602 Shortness of breath: Secondary | ICD-10-CM

## 2019-07-29 DIAGNOSIS — E785 Hyperlipidemia, unspecified: Secondary | ICD-10-CM | POA: Diagnosis present

## 2019-07-29 DIAGNOSIS — K8020 Calculus of gallbladder without cholecystitis without obstruction: Secondary | ICD-10-CM

## 2019-07-29 DIAGNOSIS — K5900 Constipation, unspecified: Secondary | ICD-10-CM | POA: Diagnosis present

## 2019-07-29 DIAGNOSIS — I10 Essential (primary) hypertension: Secondary | ICD-10-CM | POA: Diagnosis present

## 2019-07-29 LAB — CBC WITH DIFFERENTIAL/PLATELET
Abs Immature Granulocytes: 0.06 10*3/uL (ref 0.00–0.07)
Basophils Absolute: 0 10*3/uL (ref 0.0–0.1)
Basophils Relative: 0 %
Eosinophils Absolute: 0 10*3/uL (ref 0.0–0.5)
Eosinophils Relative: 0 %
HCT: 44 % (ref 39.0–52.0)
Hemoglobin: 14.2 g/dL (ref 13.0–17.0)
Immature Granulocytes: 1 %
Lymphocytes Relative: 5 %
Lymphs Abs: 0.6 10*3/uL — ABNORMAL LOW (ref 0.7–4.0)
MCH: 29.2 pg (ref 26.0–34.0)
MCHC: 32.3 g/dL (ref 30.0–36.0)
MCV: 90.3 fL (ref 80.0–100.0)
Monocytes Absolute: 0.8 10*3/uL (ref 0.1–1.0)
Monocytes Relative: 6 %
Neutro Abs: 11.7 10*3/uL — ABNORMAL HIGH (ref 1.7–7.7)
Neutrophils Relative %: 88 %
Platelets: 282 10*3/uL (ref 150–400)
RBC: 4.87 MIL/uL (ref 4.22–5.81)
RDW: 13.6 % (ref 11.5–15.5)
WBC: 13.2 10*3/uL — ABNORMAL HIGH (ref 4.0–10.5)
nRBC: 0 % (ref 0.0–0.2)

## 2019-07-29 LAB — URINALYSIS, ROUTINE W REFLEX MICROSCOPIC
Bacteria, UA: NONE SEEN
Bilirubin Urine: NEGATIVE
Glucose, UA: 150 mg/dL — AB
Hgb urine dipstick: NEGATIVE
Ketones, ur: 5 mg/dL — AB
Leukocytes,Ua: NEGATIVE
Nitrite: NEGATIVE
Protein, ur: NEGATIVE mg/dL
Specific Gravity, Urine: 1.019 (ref 1.005–1.030)
pH: 5 (ref 5.0–8.0)

## 2019-07-29 LAB — COMPREHENSIVE METABOLIC PANEL
ALT: 29 U/L (ref 0–44)
AST: 45 U/L — ABNORMAL HIGH (ref 15–41)
Albumin: 3.9 g/dL (ref 3.5–5.0)
Alkaline Phosphatase: 88 U/L (ref 38–126)
Anion gap: 7 (ref 5–15)
BUN: 25 mg/dL — ABNORMAL HIGH (ref 8–23)
CO2: 29 mmol/L (ref 22–32)
Calcium: 9.6 mg/dL (ref 8.9–10.3)
Chloride: 102 mmol/L (ref 98–111)
Creatinine, Ser: 1.16 mg/dL (ref 0.61–1.24)
GFR calc Af Amer: >60 mL/min (ref >60)
GFR calc non Af Amer: 58 mL/min — ABNORMAL LOW (ref >60)
Glucose, Bld: 211 mg/dL — ABNORMAL HIGH (ref 70–99)
Potassium: 4.6 mmol/L (ref 3.5–5.1)
Sodium: 138 mmol/L (ref 135–145)
Total Bilirubin: 1.2 mg/dL (ref 0.3–1.2)
Total Protein: 7.2 g/dL (ref 6.5–8.1)

## 2019-07-29 LAB — RESPIRATORY PANEL BY RT PCR (FLU A&B, COVID)
Influenza A by PCR: NEGATIVE
Influenza B by PCR: NEGATIVE
SARS Coronavirus 2 by RT PCR: NEGATIVE

## 2019-07-29 LAB — LIPASE, BLOOD: Lipase: 20 U/L (ref 11–51)

## 2019-07-29 MED ORDER — ONDANSETRON 4 MG PO TBDP: 4.0000 mg | ORAL_TABLET | Freq: Four times a day (QID) | ORAL | Status: DC | PRN

## 2019-07-29 MED ORDER — ONDANSETRON HCL 4 MG/2ML IJ SOLN
4.0000 mg | Freq: Four times a day (QID) | INTRAMUSCULAR | Status: DC | PRN
  Administered 2019-07-30: 4 mg via INTRAVENOUS
  Filled 2019-07-29: qty 2

## 2019-07-29 MED ORDER — HYDROMORPHONE HCL 1 MG/ML IJ SOLN
0.5000 mg | Freq: Once | INTRAMUSCULAR | Status: AC
  Administered 2019-07-29: 0.5 mg via INTRAVENOUS
  Filled 2019-07-29: qty 1

## 2019-07-29 MED ORDER — ONDANSETRON HCL 4 MG/2ML IJ SOLN
4.0000 mg | Freq: Once | INTRAMUSCULAR | Status: AC
  Administered 2019-07-29: 4 mg via INTRAVENOUS
  Filled 2019-07-29: qty 2

## 2019-07-29 MED ORDER — IOHEXOL 300 MG/ML  SOLN
100.0000 mL | Freq: Once | INTRAMUSCULAR | Status: AC | PRN
  Administered 2019-07-29: 100 mL via INTRAVENOUS

## 2019-07-29 MED ORDER — SODIUM CHLORIDE 0.9 % IV BOLUS
500.0000 mL | Freq: Once | INTRAVENOUS | Status: AC
  Administered 2019-07-29: 500 mL via INTRAVENOUS

## 2019-07-29 MED ORDER — PIPERACILLIN-TAZOBACTAM 3.375 G IVPB
3.3750 g | Freq: Three times a day (TID) | INTRAVENOUS | Status: DC
  Administered 2019-07-30 – 2019-07-31 (×4): 3.375 g via INTRAVENOUS
  Filled 2019-07-29 (×4): qty 50

## 2019-07-29 MED ORDER — OXYCODONE HCL 5 MG PO TABS
5.0000 mg | ORAL_TABLET | ORAL | Status: DC | PRN
  Administered 2019-07-30 – 2019-08-02 (×8): 5 mg via ORAL
  Filled 2019-07-29 (×9): qty 1

## 2019-07-29 MED ORDER — DEXTROSE-NACL 5-0.45 % IV SOLN: INTRAVENOUS | Status: DC

## 2019-07-29 MED ORDER — MORPHINE SULFATE (PF) 4 MG/ML IV SOLN
4.0000 mg | Freq: Once | INTRAVENOUS | Status: AC
  Administered 2019-07-29: 4 mg via INTRAVENOUS
  Filled 2019-07-29: qty 1

## 2019-07-29 MED ORDER — ACETAMINOPHEN 325 MG PO TABS
650.0000 mg | ORAL_TABLET | Freq: Four times a day (QID) | ORAL | Status: DC | PRN
  Administered 2019-07-31 (×2): 650 mg via ORAL
  Filled 2019-07-29 (×2): qty 2

## 2019-07-29 MED ORDER — INSULIN ASPART 100 UNIT/ML ~~LOC~~ SOLN
0.0000 [IU] | SUBCUTANEOUS | Status: DC
  Administered 2019-07-30 – 2019-07-31 (×5): 3 [IU] via SUBCUTANEOUS
  Administered 2019-07-31 – 2019-08-02 (×4): 2 [IU] via SUBCUTANEOUS
  Filled 2019-07-29: qty 0.15

## 2019-07-29 MED ORDER — METOPROLOL TARTRATE 5 MG/5ML IV SOLN
5.0000 mg | Freq: Four times a day (QID) | INTRAVENOUS | Status: DC | PRN
  Filled 2019-07-29 (×2): qty 5

## 2019-07-29 MED ORDER — ACETAMINOPHEN 650 MG RE SUPP: 650.0000 mg | Freq: Four times a day (QID) | RECTAL | Status: DC | PRN

## 2019-07-29 MED ORDER — PIPERACILLIN-TAZOBACTAM 3.375 G IVPB 30 MIN
3.3750 g | Freq: Once | INTRAVENOUS | Status: AC
  Administered 2019-07-29: 3.375 g via INTRAVENOUS
  Filled 2019-07-29: qty 50

## 2019-07-29 MED ORDER — ENOXAPARIN SODIUM 40 MG/0.4ML ~~LOC~~ SOLN
40.0000 mg | SUBCUTANEOUS | Status: DC
  Administered 2019-07-30 – 2019-08-01 (×4): 40 mg via SUBCUTANEOUS
  Filled 2019-07-29 (×4): qty 0.4

## 2019-07-29 MED ORDER — MORPHINE SULFATE (PF) 2 MG/ML IV SOLN
2.0000 mg | INTRAVENOUS | Status: DC | PRN
  Administered 2019-07-30 – 2019-07-31 (×5): 2 mg via INTRAVENOUS
  Filled 2019-07-29 (×5): qty 1

## 2019-07-29 NOTE — ED Provider Notes (Signed)
[facility] DEPT Provider Note   CSN: [id number] Arrival date & time: 07/29/19  1440     History Chief Complaint  Patient presents with  . Abdominal Pain    Sanjeev Renna is a 86 y.o. male with a hx of anxiety, depression, DM, HTN, hyperlipidemia, GERD, IBS, & prior hiatal hernia repair who presents to the ED with complaints of abdominal pain that began last night. Patient states pain started centrally in the epigastric/periumbilical area then moved to the R side of the abdomen, it is constant, severe, currently 10/10 in severity, worse with going over bumps on the way here no other alleviating/aggravating factors. Reports associated constipation- last BM was last night and somewhat firm small stool. Has not been passing gas today. Was able to eat lunch around 11:30 this AM. Denies fever, chills, nausea, vomiting, melena, hematochezia, dysuria, chest pain, or dyspnea.   HPI     Past Medical History:  Diagnosis Date  . Anxiety   . Cataract   . Depression   . Diabetes mellitus   . GERD (gastroesophageal reflux disease)   . Hyperlipemia   . Hypertension     Patient Active Problem List   Diagnosis Date Noted  . Sinus bradycardia 11/26/2016  . Prostate cancer ([place]) 02/03/2013  . ANXIETY 03/07/2010  . ESSENTIAL HYPERTENSION, BENIGN 03/07/2010  . GERD 03/07/2010  . HOARSENESS, CHRONIC 03/07/2010  . DIABETES MELLITUS 03/06/2010  . HYPERCHOLESTEROLEMIA 03/06/2010  . DEPRESSION 03/06/2010  . HIATAL HERNIA WITH REFLUX 03/06/2010  . DIVERTICULOSIS, COLON 03/06/2010  . IBS 03/06/2010  . RENAL CALCULUS, RECURRENT 03/06/2010  . COUGH 03/06/2010    Past Surgical History:  Procedure Laterality Date  . FRACTURE SURGERY    . HERNIA REPAIR         Family History  Problem Relation Age of Onset  . Lung cancer Brother     Social History   Tobacco Use  . Smoking status: Never Smoker  . Smokeless tobacco: Never Used  Substance Use  Topics  . Alcohol use: No  . Drug use: No    Home Medications Prior to Admission medications   Medication Sig Start Date End Date Taking? Authorizing Provider  amLODipine (NORVASC) 5 MG tablet Take 5 mg by mouth daily. 09/16/16   [provider]  aspirin 81 MG chewable tablet  05/07/18   [provider]  buPROPion (WELLBUTRIN XL) 300 MG 24 hr tablet Take 300 mg by mouth daily. 11/14/16   [provider]  fish oil-omega-3 fatty acids 1000 MG capsule Take 1 g by mouth daily.      [provider]  gabapentin (NEURONTIN) 300 MG capsule  05/07/18   [provider]  l-methylfolate-B6-B12 (METANX) 3-35-2 MG TABS Take 1 tablet by mouth 2 (two) times daily.      [provider]  LORazepam (ATIVAN) 0.5 MG tablet Take 0.5 mg by mouth 2 (two) times daily.     [provider]  Multiple Vitamin (MULITIVITAMIN WITH MINERALS) TABS Take 1 tablet by mouth daily.      [provider]  primidone (MYSOLINE) 50 MG tablet Take 50 mg by mouth 2 (two) times daily.    [provider]  tamsulosin (FLOMAX) 0.4 MG CAPS capsule Take by mouth.    [provider]  temazepam (RESTORIL) 30 MG capsule Take by mouth. 03/28/14   [provider]    Allergies    Patient has no known allergies.  Review of Systems  Review of Systems  Constitutional: Negative for chills and fever.  Respiratory: Negative for shortness of breath.   Cardiovascular: Negative for chest pain.  Gastrointestinal: Positive for abdominal pain and constipation. Negative for blood in stool, diarrhea, nausea and vomiting.  Genitourinary: Negative for dysuria.  Neurological: Negative for syncope.  All other systems reviewed and are negative.   Physical Exam Updated Vital Signs BP (!) 180/86 (BP Location: Left Arm)   Pulse (!) 103   Temp (!) 97.5 F (36.4 C) (Oral)   Resp 18   SpO2 98%   Physical Exam Vitals and nursing note reviewed.   Constitutional:      General: He is in acute distress (Appears somewhat uncomfortable.).     Appearance: He is well-developed. He is not toxic-appearing.  HENT:     Head: Normocephalic and atraumatic.  Eyes:     General:        Right eye: No discharge.        Left eye: No discharge.     Conjunctiva/sclera: Conjunctivae normal.  Cardiovascular:     Rate and Rhythm: Regular rhythm. Tachycardia present.  Pulmonary:     Effort: Pulmonary effort is normal. No respiratory distress.     Breath sounds: Normal breath sounds. No wheezing, rhonchi or rales.  Abdominal:     General: There is no distension.     Palpations: Abdomen is soft.     Tenderness: There is abdominal tenderness (primarily in the RUQ as well as to the RLQ & to the epigastrium). There is no guarding.  Musculoskeletal:     Cervical back: Neck supple.  Skin:    General: Skin is warm and dry.     Findings: No rash.  Neurological:     Mental Status: He is alert.     Comments: Clear speech.   Psychiatric:        Behavior: Behavior normal.    ED Results / Procedures / Treatments   Labs (all labs ordered are listed, but only abnormal results are displayed) Labs Reviewed  COMPREHENSIVE METABOLIC PANEL - Abnormal; Notable for the following components:      Result Value   Glucose, Bld 211 (*)    BUN 25 (*)    AST 45 (*)    GFR calc non Af Amer 58 (*)    All other components within normal limits  CBC WITH DIFFERENTIAL/PLATELET - Abnormal; Notable for the following components:   WBC 13.2 (*)    Neutro Abs 11.7 (*)    Lymphs Abs 0.6 (*)    All other components within normal limits  URINALYSIS, ROUTINE W REFLEX MICROSCOPIC - Abnormal; Notable for the following components:   Glucose, UA 150 (*)    Ketones, ur 5 (*)    All other components within normal limits  LIPASE, BLOOD    EKG None  Radiology CT Abdomen Pelvis W Contrast  Result Date: 07/29/2019 CLINICAL DATA:  Right-sided abdominal pain beginning last  night. Constipation. EXAM: CT ABDOMEN AND PELVIS WITH CONTRAST TECHNIQUE: Multidetector CT imaging of the abdomen and pelvis was performed using the standard protocol following bolus administration of intravenous contrast. CONTRAST:  163mL OMNIPAQUE IOHEXOL 300 MG/ML  SOLN COMPARISON:  04/15/2018 FINDINGS: Lower Chest: New bibasilar atelectasis. Hepatobiliary: No hepatic masses identified. The gallbladder is markedly distended and tiny calcified gallstones are seen. There is mild stranding in the pericholecystic fat, most apparent on coronal images, and these findings are consistent with acute cholecystitis. No evidence of biliary ductal dilatation. Pancreas:  No mass or inflammatory changes. Spleen: Within normal limits in size and appearance. Adrenals/Urinary Tract: No masses identified. A few small renal cysts are noted bilaterally. Several tiny less than 5 mm renal calculi are also seen bilaterally. No evidence of ureteral calculi or hydronephrosis. Unremarkable unopacified urinary bladder. Stomach/Bowel: No evidence of obstruction, inflammatory process or abnormal fluid collections. Normal appendix visualized. Diverticulosis is seen mainly involving the sigmoid colon, however there is no evidence of diverticulitis. Vascular/Lymphatic: No pathologically enlarged lymph nodes. No abdominal aortic aneurysm. Aortic atherosclerosis incidentally noted. Reproductive: Brachytherapy seeds seen throughout the prostate, which is small in size. Other:  None. Musculoskeletal:  No suspicious bone lesions identified. IMPRESSION: 1. Findings consistent with acute cholecystitis. 2. No evidence of biliary ductal dilatation. 3. Bilateral nephrolithiasis. No evidence of ureteral calculi or hydronephrosis. 4. Colonic diverticulosis. No radiographic evidence of diverticulitis. Electronically Signed   By: Marlaine Hind M.D.   On: 07/29/2019 18:16    Procedures Procedures (including critical care time)  Medications Ordered in ED  Medications  enoxaparin (LOVENOX) injection 40 mg (has no administration in time range)  dextrose 5 %-0.45 % sodium chloride infusion (has no administration in time range)  acetaminophen (TYLENOL) tablet 650 mg (has no administration in time range)    Or  acetaminophen (TYLENOL) suppository 650 mg (has no administration in time range)  oxyCODONE (Oxy IR/ROXICODONE) immediate release tablet 5 mg (has no administration in time range)  morphine 2 MG/ML injection 2 mg (has no administration in time range)  ondansetron (ZOFRAN-ODT) disintegrating tablet 4 mg (has no administration in time range)    Or  ondansetron (ZOFRAN) injection 4 mg (has no administration in time range)  metoprolol tartrate (LOPRESSOR) injection 5 mg (has no administration in time range)  piperacillin-tazobactam (ZOSYN) IVPB 3.375 g (has no administration in time range)  insulin aspart (novoLOG) injection 0-15 Units (has no administration in time range)  sodium chloride 0.9 % bolus 500 mL (0 mLs Intravenous Stopped 07/29/19 1912)  ondansetron (ZOFRAN) injection 4 mg (4 mg Intravenous Given 07/29/19 1627)  morphine 4 MG/ML injection 4 mg (4 mg Intravenous Given 07/29/19 1627)  iohexol (OMNIPAQUE) 300 MG/ML solution 100 mL (100 mLs Intravenous Contrast Given 07/29/19 1734)  piperacillin-tazobactam (ZOSYN) IVPB 3.375 g (3.375 g Intravenous New Bag/Given 07/29/19 1913)  HYDROmorphone (DILAUDID) injection 0.5 mg (0.5 mg Intravenous Given 07/29/19 1910)    ED Course  I have reviewed the triage vital signs and the nursing notes.  Pertinent labs & imaging results that were available during my care of the patient were reviewed by me and considered in my medical decision making (see chart for details).    MDM Rules/Calculators/A&P                      Patient presents to the ED with complaints of abdominal pain.  Patient appears uncomfortable, he is mildly tachycardic and hypertensive. Abdomen with most focal tenderness in the  right upper quadrant, having some difficulty assessing Murphy sign secondary to patient having a lot of pain with a deep breath.    CBC: Leukocytosis at 13.2 with left shift.  No anemia. CMP: Hyperglycemia without acidosis or anion gap elevation.  LFTs WNL. Lipase: WNL Urinalysis: No signs of infection.  Mild glucosuria and ketonuria. CT abdomen/pelvis: 1. Findings consistent with acute cholecystitis. 2. No evidence of biliary ductal dilatation. 3. Bilateral nephrolithiasis. No evidence of ureteral calculi or hydronephrosis. 4. Colonic diverticulosis. No radiographic evidence of diverticulitis.  18:10: RE-EVAL: Patient had  some relief with morphine, he is holding his RUQ as this seems to help some as well- remains focally tender to this location. Start zosyn, additional analgesics ordered, will discuss w/ general surgery.   18:30: CONSULT: Discussed with general surgeon Dr. Kieth Brightly- request abx, covid swab, will admit to general surgery service with plan for surgery tomorrow. Appreciate consultation.   Findings and plan of care discussed with supervising physician Dr. Roslynn Amble who has evaluated the patient & is in agreement.   Final Clinical Impression(s) / ED Diagnoses Final diagnoses:  Acute cholecystitis    Rx / DC Orders ED Discharge Orders    None       Leafy Kindle 07/29/19 2125    Lucrezia Starch, MD 07/30/19 2237

## 2019-07-29 NOTE — ED Triage Notes (Signed)
Pt presents with c/o right sided abdominal pain since last night, constipation as well. Pt reports he did have a BM last night but it was a hard stool. Pt reports the pain is in the right lower side. Alert and oriented, ambulatory.

## 2019-07-29 NOTE — H&P (Signed)
Reason for Consult: abdominal pain Referring Physician: Dr. Madalyn Rob  Mattheu Pointer is an 86 y.o. male.  HPI: 86 yo male with 1 day of abdominal pain. Pain is constant, right sided, and sharp. He has not had nausea or vomiting. He has never had pain like this before. He denies fevers or diarrhea. He has no history of MI or stroke.  Past Medical History:  Diagnosis Date  . Anxiety   . Cataract   . Depression   . Diabetes mellitus   . GERD (gastroesophageal reflux disease)   . Hyperlipemia   . Hypertension     Past Surgical History:  Procedure Laterality Date  . FRACTURE SURGERY    . HERNIA REPAIR      Family History  Problem Relation Age of Onset  . Lung cancer Brother     Social History:  reports that he has never smoked. He has never used smokeless tobacco. He reports that he does not drink alcohol or use drugs.  Allergies: No Known Allergies  Medications: I have reviewed the patient's current medications.  Results for orders placed or performed during the hospital encounter of 07/29/19 (from the past 48 hour(s))  Urinalysis, Routine w reflex microscopic     Status: Abnormal   Collection Time: 07/29/19  3:40 PM  Result Value Ref Range   Color, Urine YELLOW YELLOW   APPearance CLEAR CLEAR   Specific Gravity, Urine 1.019 1.005 - 1.030   pH 5.0 5.0 - 8.0   Glucose, UA 150 (A) NEGATIVE mg/dL   Hgb urine dipstick NEGATIVE NEGATIVE   Bilirubin Urine NEGATIVE NEGATIVE   Ketones, ur 5 (A) NEGATIVE mg/dL   Protein, ur NEGATIVE NEGATIVE mg/dL   Nitrite NEGATIVE NEGATIVE   Leukocytes,Ua NEGATIVE NEGATIVE   RBC / HPF 0-5 0 - 5 RBC/hpf   WBC, UA 0-5 0 - 5 WBC/hpf   Bacteria, UA NONE SEEN NONE SEEN   Mucus PRESENT     Comment: Performed at [facility], [place] [address] [place], [place] [address]  Comprehensive metabolic panel     Status: Abnormal   Collection Time: 07/29/19  4:25 PM  Result Value Ref Range   Sodium 138 135 - 145  mmol/L   Potassium 4.6 3.5 - 5.1 mmol/L   Chloride 102 98 - 111 mmol/L   CO2 29 22 - 32 mmol/L   Glucose, Bld 211 (H) 70 - 99 mg/dL   BUN 25 (H) 8 - 23 mg/dL   Creatinine, Ser 1.16 0.61 - 1.24 mg/dL   Calcium 9.6 8.9 - 10.3 mg/dL   Total Protein 7.2 6.5 - 8.1 g/dL   Albumin 3.9 3.5 - 5.0 g/dL   AST 45 (H) 15 - 41 U/L   ALT 29 0 - 44 U/L   Alkaline Phosphatase 88 38 - 126 U/L   Total Bilirubin 1.2 0.3 - 1.2 mg/dL   GFR calc non Af Amer 58 (L) >60 mL/min   GFR calc Af Amer >60 >60 mL/min   Anion gap 7 5 - 15    Comment: Performed at [facility], [place] [address], [place], [place] [address]  CBC with Differential     Status: Abnormal   Collection Time: 07/29/19  4:25 PM  Result Value Ref Range   WBC 13.2 (H) 4.0 - 10.5 K/uL   RBC 4.87 4.22 - 5.81 MIL/uL   Hemoglobin 14.2 13.0 - 17.0 g/dL   HCT 44.0 39.0 - 52.0 %   MCV  90.3 80.0 - 100.0 fL   MCH 29.2 26.0 - 34.0 pg   MCHC 32.3 30.0 - 36.0 g/dL   RDW 13.6 11.5 - 15.5 %   Platelets 282 150 - 400 K/uL   nRBC 0.0 0.0 - 0.2 %   Neutrophils Relative % 88 %   Neutro Abs 11.7 (H) 1.7 - 7.7 K/uL   Lymphocytes Relative 5 %   Lymphs Abs 0.6 (L) 0.7 - 4.0 K/uL   Monocytes Relative 6 %   Monocytes Absolute 0.8 0.1 - 1.0 K/uL   Eosinophils Relative 0 %   Eosinophils Absolute 0.0 0.0 - 0.5 K/uL   Basophils Relative 0 %   Basophils Absolute 0.0 0.0 - 0.1 K/uL   Immature Granulocytes 1 %   Abs Immature Granulocytes 0.06 0.00 - 0.07 K/uL    Comment: Performed at [facility], [place] [address] [place], [place] [address]  Lipase, blood     Status: None   Collection Time: 07/29/19  4:25 PM  Result Value Ref Range   Lipase 20 11 - 51 U/L    Comment: Performed at [facility], [place] [address], [place], [place] [address]    CT Abdomen Pelvis W Contrast  Result Date: 07/29/2019 CLINICAL DATA:  Right-sided abdominal pain beginning last night. Constipation. EXAM: CT ABDOMEN AND PELVIS WITH  CONTRAST TECHNIQUE: Multidetector CT imaging of the abdomen and pelvis was performed using the standard protocol following bolus administration of intravenous contrast. CONTRAST:  131mL OMNIPAQUE IOHEXOL 300 MG/ML  SOLN COMPARISON:  04/15/2018 FINDINGS: Lower Chest: New bibasilar atelectasis. Hepatobiliary: No hepatic masses identified. The gallbladder is markedly distended and tiny calcified gallstones are seen. There is mild stranding in the pericholecystic fat, most apparent on coronal images, and these findings are consistent with acute cholecystitis. No evidence of biliary ductal dilatation. Pancreas:  No mass or inflammatory changes. Spleen: Within normal limits in size and appearance. Adrenals/Urinary Tract: No masses identified. A few small renal cysts are noted bilaterally. Several tiny less than 5 mm renal calculi are also seen bilaterally. No evidence of ureteral calculi or hydronephrosis. Unremarkable unopacified urinary bladder. Stomach/Bowel: No evidence of obstruction, inflammatory process or abnormal fluid collections. Normal appendix visualized. Diverticulosis is seen mainly involving the sigmoid colon, however there is no evidence of diverticulitis. Vascular/Lymphatic: No pathologically enlarged lymph nodes. No abdominal aortic aneurysm. Aortic atherosclerosis incidentally noted. Reproductive: Brachytherapy seeds seen throughout the prostate, which is small in size. Other:  None. Musculoskeletal:  No suspicious bone lesions identified. IMPRESSION: 1. Findings consistent with acute cholecystitis. 2. No evidence of biliary ductal dilatation. 3. Bilateral nephrolithiasis. No evidence of ureteral calculi or hydronephrosis. 4. Colonic diverticulosis. No radiographic evidence of diverticulitis. Electronically Signed   By: Marlaine Hind M.D.   On: 07/29/2019 18:16    Review of Systems  Constitutional: Negative for chills and fever.  HENT: Negative for hearing loss.   Eyes: Negative for blurred vision  and double vision.  Respiratory: Negative for cough and hemoptysis.   Cardiovascular: Negative for chest pain and palpitations.  Gastrointestinal: Positive for abdominal pain and constipation. Negative for nausea and vomiting.  Genitourinary: Negative for dysuria and urgency.  Musculoskeletal: Negative for myalgias and neck pain.  Skin: Negative for itching and rash.  Neurological: Negative for dizziness, tingling and headaches.  Endo/Heme/Allergies: Does not bruise/bleed easily.  Psychiatric/Behavioral: Negative for depression and suicidal ideas.   Blood pressure (!) 159/87, pulse (!) 110, temperature (!) 97.5 F (36.4 C), temperature source Oral, resp. rate Marland Kitchen)  27, SpO2 92 %. Physical Exam  Vitals reviewed. Constitutional: He is oriented to person, place, and time. He appears well-developed and well-nourished.  HENT:  Head: Normocephalic and atraumatic.  Eyes: Pupils are equal, round, and reactive to light. Conjunctivae and EOM are normal.  Cardiovascular: Normal rate and regular rhythm.  Respiratory: Effort normal and breath sounds normal.  GI: Soft. Bowel sounds are normal. He exhibits no distension. There is abdominal tenderness in the right upper quadrant. There is positive Murphy's sign.  Musculoskeletal:        General: Normal range of motion.     Cervical back: Normal range of motion and neck supple.  Neurological: He is alert and oriented to person, place, and time.  Skin: Skin is warm and dry.  Psychiatric: He has a normal mood and affect. His behavior is normal.     Assessment/Plan: 86 yo male with right sided abdominal pain, Ct showing stones with inflammatory changes of the gallbladder, AST slightly elevated, leukocytosis of 13. -admit for antibiotics: zosyn -plan for surgery this weekend We discussed the etiology of her pain, we discussed treatment options and recommended surgery. We discussed details of surgery including general anesthesia, laparoscopic approach,  identification of cystic duct and common bile duct. Ligation of cystic duct and cystic artery. Possible need for intraoperative cholangiogram or open procedure. Possible risks of common bile duct injury, liver injury, cystic duct leak, bleeding, infection, post-cholecystectomy syndrome. The patient showed good understanding and all questions were answered -pain control -IV fluids -SSI for diabetes mellitus  Arta Bruce Orchid Glassberg 07/29/2019, 9:07 PM

## 2019-07-30 ENCOUNTER — Encounter (HOSPITAL_COMMUNITY): Admission: EM | Disposition: A | Discharge status: Home Health | Payer: Self-pay | Source: Home / Self Care

## 2019-07-30 ENCOUNTER — Encounter (HOSPITAL_COMMUNITY): Payer: Self-pay

## 2019-07-30 ENCOUNTER — Other Ambulatory Visit: Payer: Self-pay

## 2019-07-30 ENCOUNTER — Inpatient Hospital Stay (HOSPITAL_COMMUNITY): Payer: Medicare HMO | Admitting: Certified Registered Nurse Anesthetist

## 2019-07-30 ENCOUNTER — Inpatient Hospital Stay (HOSPITAL_COMMUNITY): Payer: Medicare HMO

## 2019-07-30 HISTORY — PX: CHOLECYSTECTOMY: SHX55

## 2019-07-30 LAB — CBC
HCT: 46.7 % (ref 39.0–52.0)
Hemoglobin: 14.5 g/dL (ref 13.0–17.0)
MCH: 28.5 pg (ref 26.0–34.0)
MCHC: 31 g/dL (ref 30.0–36.0)
MCV: 91.9 fL (ref 80.0–100.0)
Platelets: 296 10*3/uL (ref 150–400)
RBC: 5.08 MIL/uL (ref 4.22–5.81)
RDW: 13.9 % (ref 11.5–15.5)
WBC: 13.2 10*3/uL — ABNORMAL HIGH (ref 4.0–10.5)
nRBC: 0 % (ref 0.0–0.2)

## 2019-07-30 LAB — COMPREHENSIVE METABOLIC PANEL
ALT: 490 U/L — ABNORMAL HIGH (ref 0–44)
AST: 602 U/L — ABNORMAL HIGH (ref 15–41)
Albumin: 3.5 g/dL (ref 3.5–5.0)
Alkaline Phosphatase: 147 U/L — ABNORMAL HIGH (ref 38–126)
Anion gap: 11 (ref 5–15)
BUN: 22 mg/dL (ref 8–23)
CO2: 28 mmol/L (ref 22–32)
Calcium: 9.4 mg/dL (ref 8.9–10.3)
Chloride: 101 mmol/L (ref 98–111)
Creatinine, Ser: 1.14 mg/dL (ref 0.61–1.24)
GFR calc Af Amer: >60 mL/min (ref >60)
GFR calc non Af Amer: 59 mL/min — ABNORMAL LOW (ref >60)
Glucose, Bld: 191 mg/dL — ABNORMAL HIGH (ref 70–99)
Potassium: 3.9 mmol/L (ref 3.5–5.1)
Sodium: 140 mmol/L (ref 135–145)
Total Bilirubin: 1.2 mg/dL (ref 0.3–1.2)
Total Protein: 6.6 g/dL (ref 6.5–8.1)

## 2019-07-30 LAB — GLUCOSE, CAPILLARY
Glucose-Capillary: 139 mg/dL — ABNORMAL HIGH (ref 70–99)
Glucose-Capillary: 144 mg/dL — ABNORMAL HIGH (ref 70–99)
Glucose-Capillary: 148 mg/dL — ABNORMAL HIGH (ref 70–99)
Glucose-Capillary: 153 mg/dL — ABNORMAL HIGH (ref 70–99)
Glucose-Capillary: 159 mg/dL — ABNORMAL HIGH (ref 70–99)
Glucose-Capillary: 168 mg/dL — ABNORMAL HIGH (ref 70–99)
Glucose-Capillary: 199 mg/dL — ABNORMAL HIGH (ref 70–99)

## 2019-07-30 LAB — HEMOGLOBIN A1C
Hgb A1c MFr Bld: 6 % — ABNORMAL HIGH (ref 4.8–5.6)
Mean Plasma Glucose: 125.5 mg/dL

## 2019-07-30 LAB — SURGICAL PCR SCREEN
MRSA, PCR: NEGATIVE
Staphylococcus aureus: POSITIVE — AB

## 2019-07-30 SURGERY — LAPAROSCOPIC CHOLECYSTECTOMY WITH INTRAOPERATIVE CHOLANGIOGRAM
Anesthesia: General

## 2019-07-30 MED ORDER — PROPOFOL 10 MG/ML IV BOLUS
INTRAVENOUS | Status: DC | PRN
  Administered 2019-07-30: 110 mg via INTRAVENOUS

## 2019-07-30 MED ORDER — HYDROMORPHONE HCL 1 MG/ML IJ SOLN
0.2500 mg | INTRAMUSCULAR | Status: DC | PRN
  Administered 2019-07-30 (×2): 0.5 mg via INTRAVENOUS

## 2019-07-30 MED ORDER — LIDOCAINE 2% (20 MG/ML) 5 ML SYRINGE
INTRAMUSCULAR | Status: DC | PRN
  Administered 2019-07-30: 100 mg via INTRAVENOUS

## 2019-07-30 MED ORDER — ROCURONIUM BROMIDE 10 MG/ML (PF) SYRINGE
PREFILLED_SYRINGE | INTRAVENOUS | Status: AC
  Filled 2019-07-30: qty 10

## 2019-07-30 MED ORDER — ONDANSETRON HCL 4 MG/2ML IJ SOLN
INTRAMUSCULAR | Status: AC
  Filled 2019-07-30: qty 2

## 2019-07-30 MED ORDER — ONDANSETRON HCL 4 MG/2ML IJ SOLN
INTRAMUSCULAR | Status: DC | PRN
  Administered 2019-07-30: 4 mg via INTRAVENOUS

## 2019-07-30 MED ORDER — ROCURONIUM BROMIDE 10 MG/ML (PF) SYRINGE
PREFILLED_SYRINGE | INTRAVENOUS | Status: DC | PRN
  Administered 2019-07-30: 30 mg via INTRAVENOUS

## 2019-07-30 MED ORDER — LACTATED RINGERS IV SOLN: INTRAVENOUS | Status: DC | PRN

## 2019-07-30 MED ORDER — LIDOCAINE 2% (20 MG/ML) 5 ML SYRINGE
INTRAMUSCULAR | Status: AC
  Filled 2019-07-30: qty 5

## 2019-07-30 MED ORDER — SUCCINYLCHOLINE CHLORIDE 200 MG/10ML IV SOSY
PREFILLED_SYRINGE | INTRAVENOUS | Status: DC | PRN
  Administered 2019-07-30: 120 mg via INTRAVENOUS

## 2019-07-30 MED ORDER — BUPIVACAINE HCL 0.25 % IJ SOLN
INTRAMUSCULAR | Status: AC
  Filled 2019-07-30: qty 1

## 2019-07-30 MED ORDER — PHENYLEPHRINE 40 MCG/ML (10ML) SYRINGE FOR IV PUSH (FOR BLOOD PRESSURE SUPPORT)
PREFILLED_SYRINGE | INTRAVENOUS | Status: DC | PRN
  Administered 2019-07-30: 80 ug via INTRAVENOUS

## 2019-07-30 MED ORDER — LACTATED RINGERS IV SOLN
INTRAVENOUS | Status: AC | PRN
  Administered 2019-07-30 (×2): 1000 mL

## 2019-07-30 MED ORDER — DEXAMETHASONE SODIUM PHOSPHATE 10 MG/ML IJ SOLN
INTRAMUSCULAR | Status: AC
  Filled 2019-07-30: qty 1

## 2019-07-30 MED ORDER — FENTANYL CITRATE (PF) 250 MCG/5ML IJ SOLN
INTRAMUSCULAR | Status: AC
  Filled 2019-07-30: qty 5

## 2019-07-30 MED ORDER — ATROPINE SULFATE 0.4 MG/ML IV SOSY
PREFILLED_SYRINGE | INTRAVENOUS | Status: DC | PRN
  Administered 2019-07-30: .4 mg via INTRAVENOUS

## 2019-07-30 MED ORDER — DEXAMETHASONE SODIUM PHOSPHATE 10 MG/ML IJ SOLN
INTRAMUSCULAR | Status: DC | PRN
  Administered 2019-07-30: 10 mg via INTRAVENOUS

## 2019-07-30 MED ORDER — FENTANYL CITRATE (PF) 250 MCG/5ML IJ SOLN
INTRAMUSCULAR | Status: DC | PRN
  Administered 2019-07-30 (×2): 25 ug via INTRAVENOUS
  Administered 2019-07-30 (×2): 50 ug via INTRAVENOUS

## 2019-07-30 MED ORDER — LIP MEDEX EX OINT
TOPICAL_OINTMENT | CUTANEOUS | Status: DC | PRN
  Administered 2019-07-30: 1 via TOPICAL
  Filled 2019-07-30: qty 7

## 2019-07-30 MED ORDER — PROPOFOL 10 MG/ML IV BOLUS
INTRAVENOUS | Status: AC
  Filled 2019-07-30: qty 20

## 2019-07-30 MED ORDER — PHENYLEPHRINE 40 MCG/ML (10ML) SYRINGE FOR IV PUSH (FOR BLOOD PRESSURE SUPPORT)
PREFILLED_SYRINGE | INTRAVENOUS | Status: AC
  Filled 2019-07-30: qty 10

## 2019-07-30 MED ORDER — ONDANSETRON HCL 4 MG/2ML IJ SOLN: 4.0000 mg | Freq: Once | INTRAMUSCULAR | Status: DC | PRN

## 2019-07-30 MED ORDER — 0.9 % SODIUM CHLORIDE (POUR BTL) OPTIME
TOPICAL | Status: DC | PRN
  Administered 2019-07-30: 1000 mL

## 2019-07-30 MED ORDER — SUGAMMADEX SODIUM 200 MG/2ML IV SOLN
INTRAVENOUS | Status: DC | PRN
  Administered 2019-07-30: 180 mg via INTRAVENOUS

## 2019-07-30 MED ORDER — MEPERIDINE HCL 50 MG/ML IJ SOLN: 6.2500 mg | INTRAMUSCULAR | Status: DC | PRN

## 2019-07-30 MED ORDER — SODIUM CHLORIDE 0.9 % IV SOLN
INTRAVENOUS | Status: DC | PRN
  Administered 2019-07-30: 250 mL via INTRAVENOUS

## 2019-07-30 MED ORDER — MENTHOL 3 MG MT LOZG
1.0000 | LOZENGE | OROMUCOSAL | Status: DC | PRN
  Administered 2019-07-30: 3 mg via ORAL
  Filled 2019-07-30: qty 9

## 2019-07-30 MED ORDER — BUPIVACAINE-EPINEPHRINE 0.25% -1:200000 IJ SOLN
INTRAMUSCULAR | Status: DC | PRN
  Administered 2019-07-30: 30 mL

## 2019-07-30 MED ORDER — HYDROMORPHONE HCL 1 MG/ML IJ SOLN
INTRAMUSCULAR | Status: AC
  Filled 2019-07-30: qty 1

## 2019-07-30 SURGICAL SUPPLY — 49 items
ADH SKN CLS APL DERMABOND .7 (GAUZE/BANDAGES/DRESSINGS)
APL PRP STRL LF DISP 70% ISPRP (MISCELLANEOUS) ×1
APL SKNCLS STERI-STRIP NONHPOA (GAUZE/BANDAGES/DRESSINGS) ×1
APPLIER CLIP ROT 10 11.4 M/L (STAPLE)
APR CLP MED LRG 11.4X10 (STAPLE)
BAG SPEC RTRVL 10 TROC 200 (ENDOMECHANICALS) ×1
BENZOIN TINCTURE PRP APPL 2/3 (GAUZE/BANDAGES/DRESSINGS) ×2 IMPLANT
BNDG ADH 1X3 SHEER STRL LF (GAUZE/BANDAGES/DRESSINGS) ×8 IMPLANT
BNDG ADH THN 3X1 STRL LF (GAUZE/BANDAGES/DRESSINGS) ×4
CABLE HIGH FREQUENCY MONO STRZ (ELECTRODE) ×2 IMPLANT
CATH CHOLANG 76X19 KUMAR (CATHETERS) IMPLANT
CHLORAPREP W/TINT 26 (MISCELLANEOUS) ×2 IMPLANT
CLIP APPLIE ROT 10 11.4 M/L (STAPLE) IMPLANT
CLIP VESOLOCK LG 6/CT PURPLE (CLIP) IMPLANT
CLIP VESOLOCK MED LG 6/CT (CLIP) IMPLANT
COVER MAYO STAND STRL (DRAPES) ×1 IMPLANT
COVER SURGICAL LIGHT HANDLE (MISCELLANEOUS) ×2 IMPLANT
COVER WAND RF STERILE (DRAPES) IMPLANT
DECANTER SPIKE VIAL GLASS SM (MISCELLANEOUS) ×2 IMPLANT
DERMABOND ADVANCED (GAUZE/BANDAGES/DRESSINGS)
DERMABOND ADVANCED .7 DNX12 (GAUZE/BANDAGES/DRESSINGS) IMPLANT
DRAIN CHANNEL 19F RND (DRAIN) ×2 IMPLANT
DRAPE C-ARM 42X120 X-RAY (DRAPES) IMPLANT
EVACUATOR SILICONE 100CC (DRAIN) ×1 IMPLANT
GLOVE BIOGEL PI IND STRL 7.0 (GLOVE) ×1 IMPLANT
GLOVE BIOGEL PI INDICATOR 7.0 (GLOVE) ×1
GLOVE SURG SS PI 7.0 STRL IVOR (GLOVE) ×2 IMPLANT
GOWN STRL REUS W/TWL LRG LVL3 (GOWN DISPOSABLE) ×2 IMPLANT
GOWN STRL REUS W/TWL XL LVL3 (GOWN DISPOSABLE) ×4 IMPLANT
GRASPER SUT TROCAR 14GX15 (MISCELLANEOUS) IMPLANT
KIT BASIN OR (CUSTOM PROCEDURE TRAY) ×2 IMPLANT
KIT TURNOVER KIT A (KITS) IMPLANT
PENCIL SMOKE EVACUATOR (MISCELLANEOUS) IMPLANT
POUCH RETRIEVAL ECOSAC 10 (ENDOMECHANICALS) ×1 IMPLANT
POUCH RETRIEVAL ECOSAC 10MM (ENDOMECHANICALS) ×1
SCISSORS LAP 5X35 DISP (ENDOMECHANICALS) ×2 IMPLANT
SET IRRIG TUBING LAPAROSCOPIC (IRRIGATION / IRRIGATOR) ×2 IMPLANT
SET TUBE SMOKE EVAC HIGH FLOW (TUBING) ×2 IMPLANT
SLEEVE XCEL OPT CAN 5 100 (ENDOMECHANICALS) ×4 IMPLANT
STOPCOCK 4 WAY LG BORE MALE ST (IV SETS) IMPLANT
STRIP CLOSURE SKIN 1/2X4 (GAUZE/BANDAGES/DRESSINGS) ×2 IMPLANT
SUT ETHILON 2 0 PS N (SUTURE) IMPLANT
SUT MNCRL AB 4-0 PS2 18 (SUTURE) ×2 IMPLANT
SUT VICRYL 0 ENDOLOOP (SUTURE) IMPLANT
TOWEL OR 17X26 10 PK STRL BLUE (TOWEL DISPOSABLE) ×2 IMPLANT
TOWEL OR NON WOVEN STRL DISP B (DISPOSABLE) IMPLANT
TRAY LAPAROSCOPIC (CUSTOM PROCEDURE TRAY) ×2 IMPLANT
TROCAR BLADELESS OPT 5 100 (ENDOMECHANICALS) ×2 IMPLANT
TROCAR XCEL NON-BLD 11X100MML (ENDOMECHANICALS) ×2 IMPLANT

## 2019-07-30 NOTE — Progress Notes (Signed)
Dr Lissa Hoard aware pt's o2 sats are 90-92%. PCXR ordered

## 2019-07-30 NOTE — Progress Notes (Signed)
Pt stable at time of arrival to pacu. No needs at time of arrival. Report given to [facility] in [place].

## 2019-07-30 NOTE — Anesthesia Postprocedure Evaluation (Signed)
Anesthesia Post Note  Patient: [name]  Procedure(s) Performed: LAPAROSCOPIC CHOLECYSTECTOMY (N/A )     Patient location during evaluation: PACU Anesthesia Type: General Level of consciousness: sedated and patient cooperative Pain management: pain level controlled Vital Signs Assessment: post-procedure vital signs reviewed and stable Respiratory status: spontaneous breathing Cardiovascular status: stable Anesthetic complications: no Comments: SpO2 back to baseline from floor preop (~92% on 3LNC). Pt much more comfortable, but only able to pull ~500 on IS. CXR obtained showing very low lung volumes due presumably to atelectasis. I discussed with Dr. Harlow Asa and we plan to send him back to the floor with continuous pulse ox. I emphasized the need to do IS and sit up in bed as much as able. Dr. Harlow Asa aware.     Last Vitals:  Vitals:   07/30/19 1235 07/30/19 1259  BP: 134/84 (!) 147/66  Pulse: 87 80  Resp:  18  Temp: 37.2 C 37.3 C  SpO2: 92% 95%    Last Pain:  Vitals:   07/30/19 1259  TempSrc: Oral  PainSc:                  Nolon Nations

## 2019-07-30 NOTE — Progress Notes (Signed)
Pt stable with no needs. Pt jp dressing needed to be changed due excess pink drainage at site leaking. Dr. Harlow Asa called and notified. jp drain working well with dark red drainage.

## 2019-07-30 NOTE — Anesthesia Procedure Notes (Signed)
Procedure Name: Intubation Date/Time: 07/30/2019 9:41 AM Performed by: Sharlette Dense, CRNA Patient Re-evaluated:Patient Re-evaluated prior to induction Oxygen Delivery Method: Circle system utilized Preoxygenation: Pre-oxygenation with 100% oxygen Induction Type: IV induction Ventilation: Mask ventilation without difficulty and Oral airway inserted - appropriate to patient size Laryngoscope Size: Miller and 3 Grade View: Grade I Tube size: 8.0 mm Number of attempts: 1 Airway Equipment and Method: Stylet Placement Confirmation: ETT inserted through vocal cords under direct vision and breath sounds checked- equal and bilateral Secured at: 22 cm Tube secured with: Tape Dental Injury: Teeth and Oropharynx as per pre-operative assessment

## 2019-07-30 NOTE — Progress Notes (Signed)
Dr Lissa Hoard states pcxr looks ok. States pt may return to Rm.  Pt using ISC. Will order cont pulse Ox to bedside.

## 2019-07-30 NOTE — Progress Notes (Signed)
Pre Procedure note for inpatients:   [name] has been scheduled for Procedure(s): LAPAROSCOPIC CHOLECYSTECTOMY WITH POSSIBLE INTRAOPERATIVE CHOLANGIOGRAM (N/A) today. The various methods of treatment have been discussed with the patient. After consideration of the risks, benefits and treatment options the patient has consented to the planned procedure.   The patient has been seen and labs reviewed. There are no changes in the patient's condition to prevent proceeding with the planned procedure today.  Recent labs:  Lab Results  Component Value Date   WBC 13.2 (H) 07/30/2019   HGB 14.5 07/30/2019   HCT 46.7 07/30/2019   PLT 296 07/30/2019   GLUCOSE 191 (H) 07/30/2019   ALT 490 (H) 07/30/2019   AST 602 (H) 07/30/2019   NA 140 07/30/2019   K 3.9 07/30/2019   CL 101 07/30/2019   CREATININE 1.14 07/30/2019   BUN 22 07/30/2019   CO2 28 07/30/2019   TSH 1.38 03/19/2012   INR 1.06 07/02/2010   HGBA1C 6.0 (H) 07/30/2019    Mickeal Skinner, MD 07/30/2019 9:31 AM

## 2019-07-30 NOTE — Plan of Care (Signed)
  Problem: Clinical Measurements: Goal: Respiratory complications will improve Outcome: Progressing   Problem: Activity: Goal: Risk for activity intolerance will decrease Outcome: Progressing   Problem: Clinical Measurements: Goal: Cardiovascular complication will be avoided Outcome: Progressing   Problem: Nutrition: Goal: Adequate nutrition will be maintained Outcome: Progressing   Problem: Coping: Goal: Level of anxiety will decrease Outcome: Progressing

## 2019-07-30 NOTE — Op Note (Signed)
PATIENT:  [name]  86 y.o. male  PRE-OPERATIVE DIAGNOSIS:  Acute cholecystitis  POST-OPERATIVE DIAGNOSIS:  acute gangrenous cholecystitis  PROCEDURE:  Procedure(s): LAPAROSCOPIC CHOLECYSTECTOMY   SURGEON:  Surgeon(s): Ora Bollig, Arta Bruce, MD Armandina Gemma, MD  ASSISTANT: Armandina Gemma  ANESTHESIA:   local and general  Indications for procedure: [name] is a 86 y.o. male with symptoms of Abdominal pain and Nausea and vomiting consistent with gallbladder disease, Confirmed by CT.  Description of procedure: The patient was brought into the operative suite, placed supine. Anesthesia was administered with endotracheal tube. Patient was strapped in place and foot board was secured. All pressure points were offloaded by foam padding. The patient was prepped and draped in the usual sterile fashion.  A small incision was made to the right of the umbilicus. A 41mm trocar was inserted into the peritoneal cavity with optical entry. Pneumoperitoneum was applied with high flow low pressure. 2 61mm trocars were placed in the RUQ. A 58mm trocar was placed in the subxiphoid space. Marcaine was infused to the subxiphoid space and lateral upper right abdomen in the transversus abdominis plane. Next the patient was placed in reverse trendelenberg. The gallbladder was gangrenous and tense. The omentum was adhered to the gallbladder which was removed with cautery and blunt dissection. Due to the distension of the gallbladder, the gallbladder was drained with suction device.  The gallbladder was retracted cephalad and lateral. The peritoneum was reflected off the infundibulum working lateral to medial. The cystic duct and cystic artery were identified and further dissection revealed a critical view.  The cystic duct and cystic artery were doubly clipped and ligated.   The gallbladder was removed off the liver bed with cautery. The Gallbladder was placed in a specimen bag. The  gallbladder fossa was irrigated and hemostasis was applied with cautery. The gallbladder was removed via the 47mm trocar. A 19 fr blake drain was placed with the tip in the gallbladder fossa and brought out the right lateral port site and sutured in place with a 2-0 Nylon. The subxiphoid port fascia was closed with 0 vicryl via suture passer. Pneumoperitoneum was removed, all trocar were removed. All incisions were closed with 4-0 monocryl subcuticular stitch. The patient woke from anesthesia and was brought to PACU in stable condition. All counts were correct  Findings: gangrenous gallbladder  Specimen: gallbladder  Blood loss: 50 ml  Local anesthesia: 30 ml marcaine  Complications: none  PLAN OF CARE: Admit to inpatient   PATIENT DISPOSITION:  PACU - hemodynamically stable.  Gurney Maxin, M.D. General, Bariatric, & Minimally Invasive Surgery [facility]

## 2019-07-30 NOTE — Transfer of Care (Signed)
Immediate Anesthesia Transfer of Care Note  Patient: Jadarius Kulish  Procedure(s) Performed: LAPAROSCOPIC CHOLECYSTECTOMY (N/A )  Patient Location: PACU  Anesthesia Type:General  Level of Consciousness: drowsy  Airway & Oxygen Therapy: Patient Spontanous Breathing and Patient connected to face mask oxygen  Post-op Assessment: Report given to RN and Post -op Vital signs reviewed and stable  Post vital signs: Reviewed and stable  Last Vitals:  Vitals Value Taken Time  BP 161/77 07/30/19 1053  Temp    Pulse 88 07/30/19 1056  Resp 22 07/30/19 1056  SpO2 94 % 07/30/19 1056  Vitals shown include unvalidated device data.  Last Pain:  Vitals:   07/30/19 0808  TempSrc:   PainSc: 2       Patients Stated Pain Goal: 2 ([phone])  Complications: No apparent anesthesia complications

## 2019-07-30 NOTE — Progress Notes (Signed)
Pt back from PACU in stable condition. No needs at time of return to floor. Reassessment performed. Rn will continue to monitor.

## 2019-07-30 NOTE — Anesthesia Preprocedure Evaluation (Addendum)
Anesthesia Evaluation  Patient identified by MRN, date of birth, ID band Patient awake    Reviewed: Allergy & Precautions, NPO status , Patient's Chart, lab work & pertinent test results  Airway Mallampati: II  TM Distance: >3 FB Neck ROM: Full    Dental  (+) Dental Advisory Given, Partial Lower, Partial Upper   Pulmonary neg pulmonary ROS,    Pulmonary exam normal breath sounds clear to auscultation       Cardiovascular hypertension, Pt. on medications Normal cardiovascular exam Rhythm:Regular Rate:Normal     Neuro/Psych PSYCHIATRIC DISORDERS Anxiety Depression negative neurological ROS     GI/Hepatic Neg liver ROS, GERD  ,  Endo/Other  diabetes, Type 2  Renal/GU Renal disease     Musculoskeletal negative musculoskeletal ROS (+)   Abdominal   Peds  Hematology negative hematology ROS (+)   Anesthesia Other Findings   Reproductive/Obstetrics                            Anesthesia Physical Anesthesia Plan  ASA: II  Anesthesia Plan: General   Post-op Pain Management:    Induction: Intravenous  PONV Risk Score and Plan: 4 or greater and Ondansetron, Dexamethasone and Treatment may vary due to age or medical condition  Airway Management Planned: Oral ETT  Additional Equipment: None  Intra-op Plan:   Post-operative Plan: Extubation in OR  Informed Consent: I have reviewed the patients History and Physical, chart, labs and discussed the procedure including the risks, benefits and alternatives for the proposed anesthesia with the patient or authorized representative who has indicated his/her understanding and acceptance.     Dental advisory given  Plan Discussed with: CRNA  Anesthesia Plan Comments:         Anesthesia Quick Evaluation

## 2019-07-31 LAB — GLUCOSE, CAPILLARY
Glucose-Capillary: 112 mg/dL — ABNORMAL HIGH (ref 70–99)
Glucose-Capillary: 120 mg/dL — ABNORMAL HIGH (ref 70–99)
Glucose-Capillary: 140 mg/dL — ABNORMAL HIGH (ref 70–99)
Glucose-Capillary: 144 mg/dL — ABNORMAL HIGH (ref 70–99)
Glucose-Capillary: 177 mg/dL — ABNORMAL HIGH (ref 70–99)
Glucose-Capillary: 86 mg/dL (ref 70–99)
Glucose-Capillary: 99 mg/dL (ref 70–99)

## 2019-07-31 LAB — CBC
HCT: 41.8 % (ref 39.0–52.0)
Hemoglobin: 13.5 g/dL (ref 13.0–17.0)
MCH: 29.3 pg (ref 26.0–34.0)
MCHC: 32.3 g/dL (ref 30.0–36.0)
MCV: 90.9 fL (ref 80.0–100.0)
Platelets: 242 10*3/uL (ref 150–400)
RBC: 4.6 MIL/uL (ref 4.22–5.81)
RDW: 14.1 % (ref 11.5–15.5)
WBC: 13.8 10*3/uL — ABNORMAL HIGH (ref 4.0–10.5)
nRBC: 0 % (ref 0.0–0.2)

## 2019-07-31 LAB — COMPREHENSIVE METABOLIC PANEL
ALT: 340 U/L — ABNORMAL HIGH (ref 0–44)
AST: 173 U/L — ABNORMAL HIGH (ref 15–41)
Albumin: 2.9 g/dL — ABNORMAL LOW (ref 3.5–5.0)
Alkaline Phosphatase: 129 U/L — ABNORMAL HIGH (ref 38–126)
Anion gap: 9 (ref 5–15)
BUN: 22 mg/dL (ref 8–23)
CO2: 28 mmol/L (ref 22–32)
Calcium: 8.8 mg/dL — ABNORMAL LOW (ref 8.9–10.3)
Chloride: 98 mmol/L (ref 98–111)
Creatinine, Ser: 1.12 mg/dL (ref 0.61–1.24)
GFR calc Af Amer: >60 mL/min (ref >60)
GFR calc non Af Amer: 60 mL/min — ABNORMAL LOW (ref >60)
Glucose, Bld: 143 mg/dL — ABNORMAL HIGH (ref 70–99)
Potassium: 3.9 mmol/L (ref 3.5–5.1)
Sodium: 135 mmol/L (ref 135–145)
Total Bilirubin: 0.8 mg/dL (ref 0.3–1.2)
Total Protein: 6.1 g/dL — ABNORMAL LOW (ref 6.5–8.1)

## 2019-07-31 MED ORDER — ALUM & MAG HYDROXIDE-SIMETH 200-200-20 MG/5ML PO SUSP
30.0000 mL | ORAL | Status: AC | PRN
  Administered 2019-07-31: 30 mL via ORAL
  Filled 2019-07-31: qty 30

## 2019-07-31 NOTE — Progress Notes (Signed)
Progress Note: General Surgery Service   Chief Complaint/Subjective: Pain intermittently intense, currently controlled. He has tolerated a diet. He desaturates when standing and walking and is requiring oxygen at rest  Objective: Vital signs in last 24 hours: Temp:  [98.1 F (36.7 C)-99.5 F (37.5 C)] 98.6 F (37 C) (01/31 0405) Pulse Rate:  [74-100] 86 (01/31 0405) Resp:  [17-25] 18 (01/31 0405) BP: (134-174)/(60-93) 160/82 (01/31 0405) SpO2:  [88 %-97 %] 95 % (01/31 0405) Last BM Date: 07/28/19  Intake/Output from previous day: 01/30 0701 - 01/31 0700 In: 4095.6 [P.O.:1080; I.V.:2815.7; IV Piggyback:200] Out: 1140 [Urine:950; Drains:140; Blood:50] Intake/Output this shift: Total I/O In: 352.3 [P.O.:240; I.V.:112.3] Out: -   Gen: NAD  Resp: nonlabored, 724ml IS  Card: RRR  Abd: soft, incisions c/d/i, drain with serosanguinous output  Lab Results: CBC  Recent Labs    07/30/19 0331 07/31/19 0342  WBC 13.2* 13.8*  HGB 14.5 13.5  HCT 46.7 41.8  PLT 296 242   BMET Recent Labs    07/30/19 0331 07/31/19 0342  NA 140 135  K 3.9 3.9  CL 101 98  CO2 28 28  GLUCOSE 191* 143*  BUN 22 22  CREATININE 1.14 1.12  CALCIUM 9.4 8.8*   PT/INR No results for input(s): LABPROT, INR in the last 72 hours. ABG No results for input(s): PHART, HCO3 in the last 72 hours.  Invalid input(s): PCO2, PO2  Anti-infectives: Anti-infectives (From admission, onward)   Start     Dose/Rate Route Frequency Ordered Stop   07/30/19 0100  piperacillin-tazobactam (ZOSYN) IVPB 3.375 g  Status:  Discontinued     3.375 g 12.5 mL/hr over 240 Minutes Intravenous Every 8 hours 07/29/19 2105 07/31/19 0830   07/29/19 1830  piperacillin-tazobactam (ZOSYN) IVPB 3.375 g     3.375 g 100 mL/hr over 30 Minutes Intravenous  Once 07/29/19 1825 07/29/19 1943      Medications: Scheduled Meds: . enoxaparin (LOVENOX) injection  40 mg Subcutaneous Q24H  . insulin aspart  0-15 Units Subcutaneous Q4H    Continuous Infusions: . sodium chloride Stopped (07/30/19 0859)   PRN Meds:.sodium chloride, acetaminophen **OR** acetaminophen, alum & mag hydroxide-simeth, lip balm, menthol-cetylpyridinium, metoprolol tartrate, morphine injection, ondansetron **OR** ondansetron (ZOFRAN) IV, oxyCODONE  Assessment/Plan: s/p Procedure(s): LAPAROSCOPIC CHOLECYSTECTOMY 07/30/2019 -dc IVF -continue pulm toilet/ambulation -stop antibiotics -continue diet -home once respiratory status improves   LOS: 2 days   Mickeal Skinner, MD [facility]

## 2019-07-31 NOTE — Plan of Care (Signed)

## 2019-07-31 NOTE — Plan of Care (Signed)
  Problem: Clinical Measurements: Goal: Respiratory complications will improve Outcome: Progressing   Problem: Clinical Measurements: Goal: Cardiovascular complication will be avoided Outcome: Progressing   Problem: Clinical Measurements: Goal: Diagnostic test results will improve Outcome: Progressing   Problem: Nutrition: Goal: Adequate nutrition will be maintained Outcome: Progressing   Problem: Coping: Goal: Level of anxiety will decrease Outcome: Progressing

## 2019-08-01 ENCOUNTER — Inpatient Hospital Stay (HOSPITAL_COMMUNITY): Payer: Medicare HMO

## 2019-08-01 LAB — COMPREHENSIVE METABOLIC PANEL
ALT: 223 U/L — ABNORMAL HIGH (ref 0–44)
AST: 87 U/L — ABNORMAL HIGH (ref 15–41)
Albumin: 2.9 g/dL — ABNORMAL LOW (ref 3.5–5.0)
Alkaline Phosphatase: 150 U/L — ABNORMAL HIGH (ref 38–126)
Anion gap: 6 (ref 5–15)
BUN: 19 mg/dL (ref 8–23)
CO2: 30 mmol/L (ref 22–32)
Calcium: 8.9 mg/dL (ref 8.9–10.3)
Chloride: 102 mmol/L (ref 98–111)
Creatinine, Ser: 1.15 mg/dL (ref 0.61–1.24)
GFR calc Af Amer: >60 mL/min (ref >60)
GFR calc non Af Amer: 58 mL/min — ABNORMAL LOW (ref >60)
Glucose, Bld: 119 mg/dL — ABNORMAL HIGH (ref 70–99)
Potassium: 4.5 mmol/L (ref 3.5–5.1)
Sodium: 138 mmol/L (ref 135–145)
Total Bilirubin: 0.8 mg/dL (ref 0.3–1.2)
Total Protein: 5.8 g/dL — ABNORMAL LOW (ref 6.5–8.1)

## 2019-08-01 LAB — CBC
HCT: 39.7 % (ref 39.0–52.0)
Hemoglobin: 12.4 g/dL — ABNORMAL LOW (ref 13.0–17.0)
MCH: 28.6 pg (ref 26.0–34.0)
MCHC: 31.2 g/dL (ref 30.0–36.0)
MCV: 91.7 fL (ref 80.0–100.0)
Platelets: 214 10*3/uL (ref 150–400)
RBC: 4.33 MIL/uL (ref 4.22–5.81)
RDW: 13.7 % (ref 11.5–15.5)
WBC: 7.9 10*3/uL (ref 4.0–10.5)
nRBC: 0 % (ref 0.0–0.2)

## 2019-08-01 LAB — GLUCOSE, CAPILLARY
Glucose-Capillary: 115 mg/dL — ABNORMAL HIGH (ref 70–99)
Glucose-Capillary: 117 mg/dL — ABNORMAL HIGH (ref 70–99)
Glucose-Capillary: 143 mg/dL — ABNORMAL HIGH (ref 70–99)
Glucose-Capillary: 85 mg/dL (ref 70–99)
Glucose-Capillary: 90 mg/dL (ref 70–99)
Glucose-Capillary: 99 mg/dL (ref 70–99)

## 2019-08-01 MED ORDER — ACETAMINOPHEN 325 MG PO TABS
650.0000 mg | ORAL_TABLET | Freq: Four times a day (QID) | ORAL | Status: DC
  Administered 2019-08-01 – 2019-08-02 (×5): 650 mg via ORAL
  Filled 2019-08-01 (×5): qty 2

## 2019-08-01 MED ORDER — CALCIUM CARBONATE ANTACID 500 MG PO CHEW
1.0000 | CHEWABLE_TABLET | ORAL | Status: DC | PRN
  Administered 2019-08-01: 200 mg via ORAL
  Filled 2019-08-01: qty 1

## 2019-08-01 NOTE — Plan of Care (Signed)

## 2019-08-01 NOTE — Evaluation (Signed)
Physical Therapy Evaluation Patient Details Name: [name] MRN: [id number] DOB: 09/15/1932 Today's Date: 08/01/2019   History of Present Illness  Patient is 86 y.o. male s/p cholecystectomy on 07/30/19 with PMH significant for HTN, HLD, GERD, DM, and anxiety.  Clinical Impression  [name] is 86 y.o. male admitted with above HPI and diagnosis. Patient is currently limited by functional impairments below (see PT problem list). Patient lives at [facility] and is independent with mobility at baseline; the facility provides meals and activities such as exercise classes which he has participated in regularly. Patient will benefit from continued skilled PT interventions to address impairments and progress independence with mobility, recommending HHPT. Acute PT will follow and progress as able.     Follow Up Recommendations Home health PT    Equipment Recommendations  Rolling walker with 5" wheels    Recommendations for Other Services       Precautions / Restrictions Precautions Precautions: Fall Restrictions Weight Bearing Restrictions: No      Mobility  Bed Mobility Overal bed mobility: Needs Assistance Bed Mobility: Supine to Sit     Supine to sit: Supervision;HOB elevated     General bed mobility comments: sueprvision for safety and cues for use of bed rail. no assist required.  Transfers Overall transfer level: Needs assistance Equipment used: Rolling walker (2 wheeled) Transfers: Sit to/from Stand Sit to Stand: Min guard         General transfer comment: no cues need for technique with RW, guarding but no assist to initiate power up, pt with increased effort to initiate rise and use of Bil UE to perform power up.  Ambulation/Gait Ambulation/Gait assistance: Min assist Gait Distance (Feet): 70 Feet Assistive device: Rolling walker (2 wheeled) Gait Pattern/deviations: Step-through pattern;Decreased stride length;Decreased step length -  right;Decreased step length - left Gait velocity: decreased   General Gait Details: pt on 2L/min O2 and increased to 3L/min as he desaturated to 79%. pt able to follow cues for pursed liip breathing to increase SpO2. light assist required throughout for RW managment/positioning. SpO2 remained at 93-98% throughout majority of therapy session.  Stairs            Wheelchair Mobility    Modified Rankin (Stroke Patients Only)       Balance Overall balance assessment: Needs assistance Sitting-balance support: Feet supported Sitting balance-Leahy Scale: Good     Standing balance support: During functional activity;Bilateral upper extremity supported Standing balance-Leahy Scale: Fair                Pertinent Vitals/Pain Pain Assessment: Faces Faces Pain Scale: Hurts little more Pain Location: Rt abdomen Pain Descriptors / Indicators: Aching;Sore;Grimacing Pain Intervention(s): Monitored during session;Repositioned;Limited activity within patient's tolerance    Home Living Family/patient expects to be discharged to:: Assisted living(Pt lives at [facility])          Home Equipment: Grab bars - tub/shower;Shower seat - built in Additional Comments: (P) ILF for seniors (3 buildings)    Prior Function Level of Independence: Independent         Comments: pt is independent for mobility ad does not use AD or supplemental O2 for mobility. 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 He reports at ILF he has been participating in exercise classes through the television or group classes at [facility].     Hand Dominance   Dominant Hand: Right    Extremity/Trunk Assessment   Upper Extremity Assessment Upper Extremity Assessment:  Overall [facility] for tasks assessed    Lower Extremity Assessment Lower Extremity Assessment: Generalized weakness    Cervical / Trunk Assessment Cervical / Trunk Assessment: Normal  Communication   Communication: No  difficulties  Cognition Arousal/Alertness: Awake/alert Behavior During Therapy: WFL for tasks assessed/performed Overall Cognitive Status: Within Functional Limits for tasks assessed           General Comments      Exercises     Assessment/Plan    PT Assessment Patient needs continued PT services  PT Problem List Decreased strength;Decreased balance;Decreased mobility;Decreased activity tolerance;Decreased knowledge of use of DME       PT Treatment Interventions DME instruction;Functional mobility training;Balance training;Patient/family education;Therapeutic activities;Gait training;Stair training;Therapeutic exercise    PT Goals (Current goals can be found in the Care Plan section)  Acute Rehab PT Goals Patient Stated Goal: to go back to ILF and get back to independence and exercise classes PT Goal Formulation: With patient Time For Goal Achievement: 08/15/19 Potential to Achieve Goals: Good    Frequency Min 3X/week    AM-PAC PT "6 Clicks" Mobility  Outcome Measure Help needed turning from your back to your side while in a flat bed without using bedrails?: A Little Help needed moving from lying on your back to sitting on the side of a flat bed without using bedrails?: A Little Help needed moving to and from a bed to a chair (including a wheelchair)?: A Little Help needed standing up from a chair using your arms (e.g., wheelchair or bedside chair)?: A Little Help needed to walk in hospital room?: A Little Help needed climbing 3-5 steps with a railing? : A Little 6 Click Score: 18    End of Session Equipment Utilized During Treatment: Gait belt Activity Tolerance: Patient tolerated treatment well Patient left: in chair;with call bell/phone within reach;with nursing/sitter in room Nurse Communication: Mobility status PT Visit Diagnosis: Muscle weakness (generalized) (M62.81);Difficulty in walking, not elsewhere classified (R26.2)    Time: [id number] PT Time  Calculation (min) (ACUTE ONLY): 25 min   Charges:   PT Evaluation $PT Eval Low Complexity: 1 Low PT Treatments $Gait Training: 8-22 mins       Verner Mould, DPT Physical Therapist with [facility] [phone]  08/01/2019 1:24 PM

## 2019-08-01 NOTE — Care Management Important Message (Signed)
Important Message  Patient Details IM Letter given to Velva Harman RN Case Manager to present to the Patient Name: Bentleigh Krengel MRN: [id number] Date of Birth: [birth year]   Medicare Important Message Given:  Yes     Kerin Salen 08/01/2019, 11:05 AM

## 2019-08-01 NOTE — Progress Notes (Signed)
Progress Note: General Surgery Service   Chief Complaint/Subjective: Pain controlled but unchanged. On 2L [place] this AM  Objective: Vital signs in last 24 hours: Temp:  [98.2 F (36.8 C)-98.5 F (36.9 C)] 98.5 F (36.9 C) (02/01 0628) Pulse Rate:  [57-82] 67 (02/01 0628) Resp:  [16-18] 18 (02/01 0628) BP: (155-166)/(70-82) 163/82 (02/01 0628) SpO2:  [93 %-96 %] 96 % (02/01 0628) Last BM Date: 07/29/19  Intake/Output from previous day: 01/31 0701 - 02/01 0700 In: 4023.4 [P.O.:3825; I.V.:198.4] Out: 1980 [id number]; Drains:40] Intake/Output this shift: No intake/output data recorded.  Gen: NAD  Resp: nonlabored, 751ml IS  Card: RRR  Abd: soft, incisions c/d/i, drain with serosanguinous output  Lab Results: CBC  Recent Labs    07/31/19 0342 08/01/19 0327  WBC 13.8* 7.9  HGB 13.5 12.4*  HCT 41.8 39.7  PLT 242 214   BMET Recent Labs    07/31/19 0342 08/01/19 0327  NA 135 138  K 3.9 4.5  CL 98 102  CO2 28 30  GLUCOSE 143* 119*  BUN 22 19  CREATININE 1.12 1.15  CALCIUM 8.8* 8.9   PT/INR No results for input(s): LABPROT, INR in the last 72 hours. ABG No results for input(s): PHART, HCO3 in the last 72 hours.  Invalid input(s): PCO2, PO2  Anti-infectives: Anti-infectives (From admission, onward)   Start     Dose/Rate Route Frequency Ordered Stop   07/30/19 0100  piperacillin-tazobactam (ZOSYN) IVPB 3.375 g  Status:  Discontinued     3.375 g 12.5 mL/hr over 240 Minutes Intravenous Every 8 hours 07/29/19 2105 07/31/19 0830   07/29/19 1830  piperacillin-tazobactam (ZOSYN) IVPB 3.375 g     3.375 g 100 mL/hr over 30 Minutes Intravenous  Once 07/29/19 1825 07/29/19 1943      Medications: Scheduled Meds: . enoxaparin (LOVENOX) injection  40 mg Subcutaneous Q24H  . insulin aspart  0-15 Units Subcutaneous Q4H   Continuous Infusions: . sodium chloride Stopped (07/30/19 0859)   PRN Meds:.sodium chloride, acetaminophen **OR** acetaminophen, alum & mag  hydroxide-simeth, lip balm, menthol-cetylpyridinium, metoprolol tartrate, morphine injection, ondansetron **OR** ondansetron (ZOFRAN) IV, oxyCODONE  Assessment/Plan: s/p Procedure(s): LAPAROSCOPIC CHOLECYSTECTOMY 07/30/2019 -continue pulm toilet/ambulation -continue diet -wean off O2. Discharge pending resolution of O2 req   LOS: 3 days   Clovis Riley, MD [facility]

## 2019-08-02 LAB — COMPREHENSIVE METABOLIC PANEL
ALT: 167 U/L — ABNORMAL HIGH (ref 0–44)
AST: 51 U/L — ABNORMAL HIGH (ref 15–41)
Albumin: 2.6 g/dL — ABNORMAL LOW (ref 3.5–5.0)
Alkaline Phosphatase: 165 U/L — ABNORMAL HIGH (ref 38–126)
Anion gap: 8 (ref 5–15)
BUN: 18 mg/dL (ref 8–23)
CO2: 29 mmol/L (ref 22–32)
Calcium: 9.1 mg/dL (ref 8.9–10.3)
Chloride: 102 mmol/L (ref 98–111)
Creatinine, Ser: 1.02 mg/dL (ref 0.61–1.24)
GFR calc Af Amer: >60 mL/min (ref >60)
GFR calc non Af Amer: >60 mL/min (ref >60)
Glucose, Bld: 124 mg/dL — ABNORMAL HIGH (ref 70–99)
Potassium: 4.1 mmol/L (ref 3.5–5.1)
Sodium: 139 mmol/L (ref 135–145)
Total Bilirubin: 0.8 mg/dL (ref 0.3–1.2)
Total Protein: 5.7 g/dL — ABNORMAL LOW (ref 6.5–8.1)

## 2019-08-02 LAB — CBC
HCT: 39.9 % (ref 39.0–52.0)
Hemoglobin: 12.5 g/dL — ABNORMAL LOW (ref 13.0–17.0)
MCH: 28.7 pg (ref 26.0–34.0)
MCHC: 31.3 g/dL (ref 30.0–36.0)
MCV: 91.7 fL (ref 80.0–100.0)
Platelets: 240 10*3/uL (ref 150–400)
RBC: 4.35 MIL/uL (ref 4.22–5.81)
RDW: 13.5 % (ref 11.5–15.5)
WBC: 5.2 10*3/uL (ref 4.0–10.5)
nRBC: 0 % (ref 0.0–0.2)

## 2019-08-02 LAB — GLUCOSE, CAPILLARY
Glucose-Capillary: 116 mg/dL — ABNORMAL HIGH (ref 70–99)
Glucose-Capillary: 123 mg/dL — ABNORMAL HIGH (ref 70–99)
Glucose-Capillary: 108 mg/dL — ABNORMAL HIGH (ref 70–99)

## 2019-08-02 MED ORDER — DOCUSATE SODIUM 100 MG PO CAPS: 100.0000 mg | ORAL_CAPSULE | Freq: Every day | ORAL | Status: DC

## 2019-08-02 MED ORDER — BUPROPION HCL ER (XL) 300 MG PO TB24
300.0000 mg | ORAL_TABLET | Freq: Every day | ORAL | Status: DC
  Administered 2019-08-02: 14:00:00 300 mg via ORAL
  Filled 2019-08-02: qty 1

## 2019-08-02 MED ORDER — TAMSULOSIN HCL 0.4 MG PO CAPS: 0.8000 mg | ORAL_CAPSULE | Freq: Every day | ORAL | Status: DC

## 2019-08-02 MED ORDER — PRIMIDONE 50 MG PO TABS
50.0000 mg | ORAL_TABLET | Freq: Two times a day (BID) | ORAL | Status: DC
  Administered 2019-08-02: 50 mg via ORAL
  Filled 2019-08-02 (×2): qty 1

## 2019-08-02 MED ORDER — OXYCODONE HCL 5 MG PO TABS: 5.0000 mg | ORAL_TABLET | ORAL | 0 refills | Status: DC | PRN

## 2019-08-02 MED ORDER — TEMAZEPAM 15 MG PO CAPS: 30.0000 mg | ORAL_CAPSULE | Freq: Every day | ORAL | Status: DC

## 2019-08-02 MED ORDER — POLYETHYLENE GLYCOL 3350 17 G PO PACK: 17.0000 g | PACK | Freq: Every day | ORAL | Status: DC

## 2019-08-02 MED ORDER — AMLODIPINE BESYLATE 10 MG PO TABS
10.0000 mg | ORAL_TABLET | Freq: Every day | ORAL | Status: DC
  Administered 2019-08-02: 10 mg via ORAL
  Filled 2019-08-02: qty 1

## 2019-08-02 MED ORDER — SALINE SPRAY 0.65 % NA SOLN
1.0000 | NASAL | Status: DC | PRN
  Administered 2019-08-02: 1 via NASAL
  Filled 2019-08-02 (×2): qty 44

## 2019-08-02 MED ORDER — POLYETHYLENE GLYCOL 3350 17 G PO PACK: 17.0000 g | PACK | Freq: Every day | ORAL | 0 refills | Status: DC

## 2019-08-02 MED ORDER — DOCUSATE SODIUM 100 MG PO CAPS: 100.0000 mg | ORAL_CAPSULE | Freq: Every day | ORAL | 0 refills | Status: DC

## 2019-08-02 MED ORDER — GABAPENTIN 400 MG PO CAPS
400.0000 mg | ORAL_CAPSULE | Freq: Three times a day (TID) | ORAL | Status: DC
  Administered 2019-08-02 (×2): 400 mg via ORAL
  Filled 2019-08-02 (×2): qty 1

## 2019-08-02 NOTE — Plan of Care (Signed)

## 2019-08-02 NOTE — Discharge Summary (Signed)
[facility] Surgery/Trauma Discharge Summary   Patient ID: [name] MRN: [id number] DOB/AGE: May 20, 1933 86 y.o.  Admit date: 07/29/2019 Discharge date: 08/02/2019  Admitting Diagnosis: Cholecystitis   Discharge Diagnosis Patient Active Problem List   Diagnosis Date Noted  . Acute cholecystitis 07/29/2019  . Sinus bradycardia 11/26/2016  . Prostate cancer ([place]) 02/03/2013  . ANXIETY 03/07/2010  . ESSENTIAL HYPERTENSION, BENIGN 03/07/2010  . GERD 03/07/2010  . HOARSENESS, CHRONIC 03/07/2010  . DIABETES MELLITUS 03/06/2010  . HYPERCHOLESTEROLEMIA 03/06/2010  . DEPRESSION 03/06/2010  . HIATAL HERNIA WITH REFLUX 03/06/2010  . DIVERTICULOSIS, COLON 03/06/2010  . IBS 03/06/2010  . RENAL CALCULUS, RECURRENT 03/06/2010  . COUGH 03/06/2010    Consultants none  Imaging: DG Chest 2 View  Result Date: 08/01/2019 CLINICAL DATA:  Recent cholecystectomy.  Hypoxia. EXAM: CHEST - 2 VIEW COMPARISON:  07/30/2019 FINDINGS: Mild persistent bibasilar atelectasis right worse than left, possibly slightly improved. Small amount of pleural fluid in the posterior costophrenic angles. Upper lungs remain clear. IMPRESSION: Persistent bibasilar atelectasis, possibly with slight improvement on the right. Electronically Signed   By: Nelson Chimes M.D.   On: 08/01/2019 11:08    Procedures Dr. Kieth Brightly (07/30/2019) - Laparoscopic Cholecystectomy   HPI: [name] is an 86 y.o. male with 1 day of abdominal pain. Pain is constant, right sided, and sharp. He has not had nausea or vomiting. He has never had pain like this before. He denies fevers or diarrhea. He has no history of MI or stroke.  Hospital Course:  Workup showed cholecystitis.  Patient was admitted and underwent procedure listed above.  Tolerated procedure well and was transferred to the floor. Intra operative findings were gangrenous cholecystitis and a drain was placed in the OR. Diet was advanced as  tolerated. Patient required [place] O2 for hypoxia. CXR showed bibasilar atelectasis. Patient worked on pulmonary toilet and his hypoxia improved. He worked with therapies who recommended HH PT. Drain remained serosanguinous and was removed prior to discharge.   On POD#3, the patient was voiding well, tolerating diet, ambulating well, pain well controlled, vital signs stable, incisions c/d/i and felt stable for discharge home.  Patient will follow up as outlined below and knows to call with questions or concerns.     Patient was discharged in good condition.  The [place] Substance controlled database was reviewed prior to prescribing narcotic pain medication to this patient.  Physical Exam: Gen:  Alert, NAD, pleasant, cooperative Card:  RRR, no M/G/R heard Pulm:  CTA, no W/R/R, effort normal Abd: Soft, ND, +BS, incisions C/D/I, drain with minimal sanguinous drainage, TTP of incision sites and RUQ. No peritonitis  Skin: no rashes noted, warm and dry  Allergies as of 08/02/2019   No Known Allergies     Medication List    STOP taking these medications   traMADol 50 MG tablet Commonly known as: ULTRAM     TAKE these medications   amLODipine 10 MG tablet Commonly known as: NORVASC Take 10 mg by mouth daily.   aspirin 81 MG chewable tablet Chew 81 mg by mouth daily as needed for moderate pain.   buPROPion 300 MG 24 hr tablet Commonly known as: WELLBUTRIN XL Take 300 mg by mouth daily.   docusate sodium 100 MG capsule Commonly known as: COLACE Take 1 capsule (100 mg total) by mouth daily.   gabapentin 400 MG capsule Commonly known as: NEURONTIN Take 400 mg by mouth 3 (three) times daily.   LORazepam 0.5 MG tablet Commonly  known as: ATIVAN Take 0.5 mg by mouth 2 (two) times daily.   multivitamin with minerals Tabs tablet Take 1 tablet by mouth daily.   omeprazole 40 MG capsule Commonly known as: PRILOSEC Take 40 mg by mouth daily.   oxyCODONE 5 MG immediate release  tablet Commonly known as: Oxy IR/ROXICODONE Take 1 tablet (5 mg total) by mouth every 4 (four) hours as needed for moderate pain.   polyethylene glycol 17 g packet Commonly known as: MIRALAX / GLYCOLAX Take 17 g by mouth daily.   primidone 50 MG tablet Commonly known as: MYSOLINE Take 50 mg by mouth 2 (two) times daily.   tamsulosin 0.4 MG Caps capsule Commonly known as: FLOMAX Take 0.8 mg by mouth daily after supper.   temazepam 30 MG capsule Commonly known as: RESTORIL Take 30 mg by mouth at bedtime.   Vitamin D (Ergocalciferol) 1.25 MG (50000 UNIT) Caps capsule Commonly known as: DRISDOL Take 50,000 Units by mouth every 7 (seven) days.            Durable Medical Equipment  (From admission, onward)         Start     Ordered   08/02/19 1018  For home use only DME 4 wheeled rolling walker with seat  Once    Question:  Patient needs a walker to treat with the following condition  Answer:  S/P laparoscopic cholecystectomy   08/02/19 1017           Follow-up [facility], [place]. Go on 08/18/2019.   Specialty: General Surgery Why: at 2:45pm. Please arrive 30 minutes prior to complete paperwork. Please bring photo ID and insurance card.  Contact information: [address] [place] [phone]          Signed: [facility] 08/02/2019, 3:13 PM Please see amion for pager for the following: Cristine Polio, & Friday 7:00am - 4:30pm Thursdays 7:00am -11:30am

## 2019-08-02 NOTE — TOC Initial Note (Signed)
Transition of Care [facility]) - Initial/Assessment Note    Patient Details  Name: [name] MRN: [id number] Date of Birth: [birth year]  Transition of Care [facility]) CM/SW Contact:    Lia Hopping, LCSW Phone Number: 08/02/2019, 10:54 AM  Clinical Narrative:                Patient alert and oriented. Patient is a resident at [place] facility. CSW provided Home Health choice to the patient. Patient prefers to use [facility] arranged through the retirement community. CSW reached out to Rep. Regina and confirm services are available and will be arranged. CSW faxed orders to [phone]. CSW ordered 4 wheel walker with a seat. DME to be delivered to the patient room.  No other needs identified at this time.  Expected Discharge Plan: [place] Barriers to Discharge: No Barriers Identified   Patient Goals and CMS Choice     Choice offered to / list presented to : NA  Expected Discharge Plan and Services Expected Discharge Plan: [place] In-house Referral: Clinical Social Work   Post Acute Care Choice: [place] arrangements for the past 2 months: [place]                 DME Arranged: Walker rolling with seat DME Agency: [employer] Date DME Agency Contacted: 08/02/19 Time DME Agency Contacted: [id number] Representative spoke with at DME Agency: [place]: PT [place]: Other - See comment([facility] [place]) Date [place]: 08/02/19 Time [place]: 1050 Representative spoke with at [place] Arrangements/Services Living arrangements for the past 2 months: Materials engineer Lives with:: Self Patient language and need for interpreter reviewed:: No Do you feel safe going back to the place where you live?: Yes      Need for Family Participation in Patient Care: Yes (Comment) Care giver support system in  place?: Yes (comment) Current home services: Home PT Criminal Activity/Legal Involvement Pertinent to Current Situation/Hospitalization: No - Comment as needed  Activities of Daily Living Home Assistive Devices/Equipment: Eyeglasses, Dentures (specify type)(lower partials) ADL Screening (condition at time of admission) Patient's cognitive ability adequate to safely complete daily activities?: Yes Is the patient deaf or have difficulty hearing?: No Does the patient have difficulty seeing, even when wearing glasses/contacts?: No Does the patient have difficulty concentrating, remembering, or making decisions?: No Patient able to express need for assistance with ADLs?: Yes Does the patient have difficulty dressing or bathing?: Yes Independently performs ADLs?: Yes (appropriate for developmental age) Does the patient have difficulty walking or climbing stairs?: No Weakness of Legs: Left Weakness of Arms/Hands: None  Permission Sought/Granted Permission sought to share information with : Chartered certified accountant granted to share information with : Yes, Verbal Permission Granted     Permission granted to share info w AGENCY: Home Health agency        Emotional Assessment Appearance:: Appears stated age Attitude/Demeanor/Rapport: Engaged Affect (typically observed): Pleasant Orientation: : Oriented to Self, Oriented to Place, Oriented to Situation, Oriented to  Time Alcohol / Substance Use: Not Applicable Psych Involvement: No (comment)  Admission diagnosis:  Acute cholecystitis [K81.0] Patient Active Problem List   Diagnosis Date Noted  . Acute cholecystitis 07/29/2019  . Sinus bradycardia 11/26/2016  . Prostate cancer ([place]) 02/03/2013  . ANXIETY 03/07/2010  . ESSENTIAL HYPERTENSION, BENIGN 03/07/2010  . GERD 03/07/2010  . HOARSENESS, CHRONIC 03/07/2010  .  DIABETES MELLITUS 03/06/2010  . HYPERCHOLESTEROLEMIA 03/06/2010  . DEPRESSION 03/06/2010  . HIATAL  HERNIA WITH REFLUX 03/06/2010  . DIVERTICULOSIS, COLON 03/06/2010  . IBS 03/06/2010  . RENAL CALCULUS, RECURRENT 03/06/2010  . COUGH 03/06/2010   PCP:  Simona Huh, NP Pharmacy:   [place], [place] [address] Phone: [phone] Fax: [phone]     Social Determinants of Health (SDOH) Interventions    Readmission Risk Interventions No flowsheet data found.

## 2019-08-02 NOTE — Progress Notes (Addendum)
[facility]/Trauma Progress Note  3 Days Post-Op   Assessment/Plan GERD Diabetes - SSI HTN - home norvasc Depression - home meds BPH - home flomax Essential tremor - primidone  Acute gangrenous cholecystitis - s/p laparoscopic cholecystectomy and JP drain, Dr. Kieth Brightly, 01/30 - drain is sanguinous, will remove prior to DC - LFT's trending down, TBILI WNL  Hypoxia - was requiring 2L [place] - today sating at 97% at rest on RA, PT will obtain ambulatory sats  FEN: carb mod VTE: SCD's, lovenox ID: Zosyn 01/29-01/31,   WBC WNL,   Afebrile  Foley: none Follow up: Dr. Kieth Brightly  DISPO:  Home today if O2 sats are ok. PT recs HH. HH pending    LOS: 4 days    Subjective: CC: abdominal pain  Abdomen is sore. No issues overnight. He is using his IS and flutter valve. No N or V. He [facility] in independent living at [facility]?   Objective: Vital signs in last 24 hours: Temp:  [97.8 F (36.6 C)-98.1 F (36.7 C)] 98.1 F (36.7 C) (02/02 0404) Pulse Rate:  [54-69] 69 (02/02 0404) Resp:  [18] 18 (02/02 0404) BP: (177-184)/(62-70) 181/62 (02/02 0404) SpO2:  [96 %-98 %] 96 % (02/02 0404) Last BM Date: 07/29/19  Intake/Output from previous day: 02/01 0701 - 02/02 0700 In: 74 [P.O.:520] Out: 2365 [Urine:2275; Drains:90] Intake/Output this shift: Total I/O In: -  Out: 20 [Drains:20]  PE:  Gen:  Alert, NAD, pleasant, cooperative Card:  RRR, no M/G/R heard Pulm:  CTA, no W/R/R, effort normal Abd: Soft, ND, +BS, incisions C/D/I, drain with minimal sanguinous drainage, TTP of incision sites and RUQ. No peritonitis  Skin: no rashes noted, warm and dry   Anti-infectives: Anti-infectives (From admission, onward)   Start     Dose/Rate Route Frequency Ordered Stop   07/30/19 0100  piperacillin-tazobactam (ZOSYN) IVPB 3.375 g  Status:  Discontinued     3.375 g 12.5 mL/hr over 240 Minutes Intravenous Every 8 hours 07/29/19 2105 07/31/19 0830   07/29/19 1830   piperacillin-tazobactam (ZOSYN) IVPB 3.375 g     3.375 g 100 mL/hr over 30 Minutes Intravenous  Once 07/29/19 1825 07/29/19 1943      Lab Results:  Recent Labs    08/01/19 0327 08/02/19 0330  WBC 7.9 5.2  HGB 12.4* 12.5*  HCT 39.7 39.9  PLT 214 240   BMET Recent Labs    08/01/19 0327 08/02/19 0330  NA 138 139  K 4.5 4.1  CL 102 102  CO2 30 29  GLUCOSE 119* 124*  BUN 19 18  CREATININE 1.15 1.02  CALCIUM 8.9 9.1   PT/INR No results for input(s): LABPROT, INR in the last 72 hours. CMP     Component Value Date/Time   NA 139 08/02/2019 0330   K 4.1 08/02/2019 0330   CL 102 08/02/2019 0330   CO2 29 08/02/2019 0330   GLUCOSE 124 (H) 08/02/2019 0330   BUN 18 08/02/2019 0330   CREATININE 1.02 08/02/2019 0330   CREATININE 1.22 09/02/2012 1309   CALCIUM 9.1 08/02/2019 0330   PROT 5.7 (L) 08/02/2019 0330   ALBUMIN 2.6 (L) 08/02/2019 0330   AST 51 (H) 08/02/2019 0330   ALT 167 (H) 08/02/2019 0330   ALKPHOS 165 (H) 08/02/2019 0330   BILITOT 0.8 08/02/2019 0330   GFRNONAA >60 08/02/2019 0330   GFRAA >60 08/02/2019 0330   Lipase     Component Value Date/Time   LIPASE 20 07/29/2019 1625  Studies/Results: DG Chest 2 View  Result Date: 08/01/2019 CLINICAL DATA:  Recent cholecystectomy.  Hypoxia. EXAM: CHEST - 2 VIEW COMPARISON:  07/30/2019 FINDINGS: Mild persistent bibasilar atelectasis right worse than left, possibly slightly improved. Small amount of pleural fluid in the posterior costophrenic angles. Upper lungs remain clear. IMPRESSION: Persistent bibasilar atelectasis, possibly with slight improvement on the right. Electronically Signed   By: Nelson Chimes M.D.   On: 08/01/2019 11:08     Kalman Drape, [facility] Please see amion for pager for the following: Cristine Polio, & Friday 7:00am - 4:30pm Thursdays 7:00am -11:30am

## 2019-08-02 NOTE — Progress Notes (Signed)
Physical Therapy Treatment Patient Details Name: [name] MRN: [id number] DOB: 08-Nov-1932 Today's Date: 08/02/2019    History of Present Illness Patient is 86 y.o. male s/p cholecystectomy on 07/30/19 with PMH significant for HTN, HLD, GERD, DM, and anxiety.    PT Comments    Pt OOB in recliner RA at 94%.  Feeling "good".  Assisted with amb in hallway.  General transfer comment: <25% VC's on safety with turns (pt not use to using a walker)   General Gait Details: amb well on RA avg sats 92%.  Did c/o nasal stuffiness "that's why I can't breath".  25% VCon proper walker to self distance and safety with turns. liimited distance with mild c/o fatigue.  Pt plans to return to [employer] at [facility].  Pt would benefit from a Rollator with seat.  Pt does not qualify for home oxygen.   Follow Up Recommendations  Home health PT     Equipment Recommendations  Other (comment)(4WW Rollator)    Recommendations for Other Services       Precautions / Restrictions Precautions Precautions: Fall Restrictions Weight Bearing Restrictions: No    Mobility  Bed Mobility               General bed mobility comments: OOB in recliner  Transfers Overall transfer level: Needs assistance Equipment used: Rolling walker (2 wheeled);None Transfers: Sit to/from [employer] to Stand: Supervision;Min guard Stand pivot transfers: Min guard       General transfer comment: <25% VC's on safety with turns (pt not use to using a walker)  Ambulation/Gait Ambulation/Gait assistance: Supervision;Min guard Gait Distance (Feet): 85 Feet Assistive device: Rolling walker (2 wheeled) Gait Pattern/deviations: Step-through pattern;Decreased stride length;Decreased step length - right;Decreased step length - left Gait velocity: decreased   General Gait Details: amb well on RA avg sats 92%.  Did c/o nasal stuffiness "that's why I can't breath".  25% VCon proper walker  to self distance and safety with turns.  :imited distance with mild c/o fatigue.   Stairs             Wheelchair Mobility    Modified Rankin (Stroke Patients Only)       Balance Overall balance assessment: Needs assistance Sitting-balance support: Feet supported Sitting balance-Leahy Scale: Good     Standing balance support: During functional activity;Bilateral upper extremity supported Standing balance-Leahy Scale: Fair                              Cognition Arousal/Alertness: Awake/alert Behavior During Therapy: WFL for tasks assessed/performed Overall Cognitive Status: Within Functional Limits for tasks assessed                                 General Comments: AxO x 4 pleasant      Exercises      General Comments        Pertinent Vitals/Pain Pain Assessment: 0-10 Pain Score: 3  Faces Pain Scale: Hurts little more Pain Location: Rt abdomen Pain Descriptors / Indicators: Tightness;Guarding;Sore;Discomfort Pain Intervention(s): Monitored during session    Home Living Family/patient expects to be discharged to:: Other (Comment)             Home Equipment: Grab bars - tub/shower;Shower seat - built in Additional Comments: ILF for seniors (3 buildings)    Prior Function Level of Independence: Independent  Comments: pt is independent for mobility ad does not use AD or supplemental O2 for mobility. 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 He reports at ILF he has been participating in exercise classes through the television or group classes at [facility]. Meals are being served to residents in their rooms currently due to Covid   PT Goals (current goals can now be found in the care plan section) Acute Rehab PT Goals Patient Stated Goal: to go back to ILF and get back to independence and exercise classes Progress towards PT goals: Progressing toward goals    Frequency    Min  3X/week      PT Plan Current plan remains appropriate    Co-evaluation              AM-PAC PT "6 Clicks" Mobility   Outcome Measure  Help needed turning from your back to your side while in a flat bed without using bedrails?: A Little Help needed moving from lying on your back to sitting on the side of a flat bed without using bedrails?: A Little Help needed moving to and from a bed to a chair (including a wheelchair)?: A Little Help needed standing up from a chair using your arms (e.g., wheelchair or bedside chair)?: A Little Help needed to walk in hospital room?: A Little Help needed climbing 3-5 steps with a railing? : A Little 6 Click Score: 18    End of Session Equipment Utilized During Treatment: Gait belt Activity Tolerance: Patient tolerated treatment well Patient left: in chair;with call bell/phone within reach;with nursing/sitter in room Nurse Communication: Mobility status PT Visit Diagnosis: Muscle weakness (generalized) (M62.81);Difficulty in walking, not elsewhere classified (R26.2)     Time: 1005-1030 PT Time Calculation (min) (ACUTE ONLY): 25 min  Charges:  $Gait Training: 8-22 mins $Therapeutic Activity: 8-22 mins                     Rica Koyanagi  PTA Acute  Rehabilitation Services Pager      [phone] Office      [phone]

## 2019-08-02 NOTE — Evaluation (Signed)
Occupational Therapy Evaluation Patient Details Name: [name] MRN: [id number] DOB: 1933/05/23 Today's Date: 08/02/2019    History of Present Illness Patient is 86 y.o. male s/p cholecystectomy on 07/30/19 with PMH significant for HTN, HLD, GERD, DM, and anxiety.   Clinical Impression   Pt admitted with the above diagnoses and presents with below problem list. Pt will benefit from continued acute OT to address the below listed deficits and maximize independence with basic ADLs prior to d/c back to ILF. At baseline, pt is independent with ADLs, participates in exercises offerings at his ILF. Pt currently min guard with LB ADLs and functional transfers/mobility. Placed on RA at start of session and able to remain on RA while mobilizing in his room. Left on RA at end of session as he continued to keep sats above 95.     Follow Up Recommendations  Home health OT;Supervision - Intermittent    Equipment Recommendations  None recommended by OT    Recommendations for Other Services       Precautions / Restrictions Precautions Precautions: Fall Restrictions Weight Bearing Restrictions: No      Mobility Bed Mobility               General bed mobility comments: up in recliner  Transfers Overall transfer level: Needs assistance Equipment used: Rolling walker (2 wheeled) Transfers: Sit to/from Stand Sit to Stand: Min guard         General transfer comment: to/from recliner. min guard for safety.     Balance Overall balance assessment: Needs assistance Sitting-balance support: Feet supported Sitting balance-Leahy Scale: Good     Standing balance support: During functional activity;Bilateral upper extremity supported Standing balance-Leahy Scale: Fair                             ADL either performed or assessed with clinical judgement   ADL Overall ADL's : Needs assistance/impaired Eating/Feeding: Set up;Sitting   Grooming: Set  up;Sitting   Upper Body Bathing: Set up;Sitting   Lower Body Bathing: Min guard;Sit to/from stand   Upper Body Dressing : Set up;Sitting   Lower Body Dressing: Min guard;Sit to/from stand   Toilet Transfer: Min guard;Ambulation;RW   Toileting- Water quality scientist and Hygiene: Min guard;Sit to/from stand   Tub/ Shower Transfer: Min guard;Ambulation;Rolling walker;3 in 1   Functional mobility during ADLs: Min guard;Rolling walker General ADL Comments: Pt tolerated household distance ambulation fairly well. R/o tightness in R abdomen. Able to raise B feet to access for LB ADLs in seated position to avoid trunk flexion (for comfort).     Vision         Perception     Praxis      Pertinent Vitals/Pain Pain Assessment: Faces Faces Pain Scale: Hurts little more Pain Location: Rt abdomen Pain Descriptors / Indicators: Tightness;Guarding;Sore Pain Intervention(s): Monitored during session;Limited activity within patient's tolerance;Repositioned;Other (comment)(RN recently gave Tylenol)     Hand Dominance Right   Extremity/Trunk Assessment Upper Extremity Assessment Upper Extremity Assessment: Overall WFL for tasks assessed;Generalized weakness   Lower Extremity Assessment Lower Extremity Assessment: Defer to PT evaluation   Cervical / Trunk Assessment Cervical / Trunk Assessment: Normal   Communication Communication Communication: No difficulties   Cognition Arousal/Alertness: Awake/alert Behavior During Therapy: WFL for tasks assessed/performed Overall Cognitive Status: Within Functional Limits for tasks assessed  General Comments       Exercises     Shoulder Instructions      Home Living Family/patient expects to be discharged to:: Other (Comment)                             Home Equipment: Grab bars - tub/shower;Shower seat - built in   Additional Comments: ILF for seniors (3 buildings)       Prior Functioning/Environment Level of Independence: Independent        Comments: pt is independent for mobility ad does not use AD or supplemental O2 for mobility. 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 He reports at ILF he has been participating in exercise classes through the television or group classes at [facility]. Meals are being served to residents in their rooms currently due to Covid        OT Problem List: Decreased strength;Decreased activity tolerance;Impaired balance (sitting and/or standing);Decreased knowledge of use of DME or AE;Decreased knowledge of precautions;Pain      OT Treatment/Interventions: Self-care/ADL training;Energy conservation;DME and/or AE instruction;Therapeutic activities;Patient/family education;Balance training    OT Goals(Current goals can be found in the care plan section) Acute Rehab OT Goals Patient Stated Goal: to go back to ILF and get back to independence and exercise classes OT Goal Formulation: With patient Time For Goal Achievement: 08/16/19 Potential to Achieve Goals: Good ADL Goals Pt Will Perform Grooming: with modified independence;standing Pt Will Perform Upper Body Bathing: with modified independence;sitting;standing Pt Will Perform Lower Body Bathing: with modified independence;sit to/from stand Pt Will Transfer to Toilet: with modified independence;ambulating Pt Will Perform Toileting - Clothing Manipulation and hygiene: with modified independence;sit to/from stand Pt Will Perform Tub/Shower Transfer: Shower transfer;ambulating;with supervision;shower seat;rolling walker  OT Frequency: Min 2X/week   Barriers to D/C:    from ILF, lives alone       Co-evaluation              AM-PAC OT "6 Clicks" Daily Activity     Outcome Measure Help from another person eating meals?: None Help from another person taking care of personal grooming?: None Help from another person toileting, which  includes using toliet, bedpan, or urinal?: A Little Help from another person bathing (including washing, rinsing, drying)?: A Little Help from another person to put on and taking off regular upper body clothing?: None Help from another person to put on and taking off regular lower body clothing?: A Little 6 Click Score: 21   End of Session Equipment Utilized During Treatment: Rolling walker Nurse Communication: Other (comment)(on RA throughout session, left on RA, sats 95+)  Activity Tolerance: Patient tolerated treatment well Patient left: in chair;with call bell/phone within reach;Other (comment)(with MD present)  OT Visit Diagnosis: Unsteadiness on feet (R26.81);Pain                Time: [id number] OT Time Calculation (min): 13 min Charges:  OT General Charges $OT Visit: 1 Visit OT Evaluation $OT Eval Low Complexity: [place], OT Acute Rehabilitation Services Pager: [phone] Office: [phone]   Hortencia Pilar 08/02/2019, 10:14 AM

## 2019-08-02 NOTE — Discharge Instructions (Signed)
[facility] ______[facility] °LAPAROSCOPIC SURGERY: POST OP INSTRUCTIONS °Always review your discharge instruction sheet given to you by the facility where your surgery was performed. °IF YOU HAVE DISABILITY OR FAMILY LEAVE FORMS, YOU MUST BRING THEM TO THE OFFICE FOR PROCESSING.   °DO NOT GIVE THEM TO YOUR DOCTOR. ° °1. A prescription for pain medication may be given to you upon discharge.  Take your pain medication as prescribed, if needed.  If narcotic pain medicine is not needed, then you may take acetaminophen (Tylenol) or ibuprofen (Advil) as needed. °2. Take your usually prescribed medications unless otherwise directed. °3. If you need a refill on your pain medication, please contact your pharmacy.  They will contact our office to request authorization. Prescriptions will not be filled after 5pm or on week-ends. °4. You should follow a light diet the first few days after arrival home, such as soup and crackers, etc.  Be sure to include lots of fluids daily. °5. Most patients will experience some swelling and bruising in the area of the incisions.  Ice packs will help.  Swelling and bruising can take several days to resolve.  °6. It is common to experience some constipation if taking pain medication after surgery.  Increasing fluid intake and taking a stool softener (such as Colace) will usually help or prevent this problem from occurring.  A mild laxative (Milk of Magnesia or Miralax) should be taken according to package instructions if there are no bowel movements after 48 hours. °7. Unless discharge instructions indicate otherwise, you may remove your bandages 24-48 hours after surgery, and you may shower at that time.  You may have steri-strips (small skin tapes) in place directly over the incision.  These strips should be left on the skin for 7-10 days.  If your surgeon used skin glue on the incision, you may shower in 24 hours.  The glue will flake off over the next 2-3 weeks.  Any sutures or  staples will be removed at the office during your follow-up visit. °8. ACTIVITIES:  You may resume regular (light) daily activities beginning the next day--such as daily self-care, walking, climbing stairs--gradually increasing activities as tolerated.  You may have sexual intercourse when it is comfortable.  Refrain from any heavy lifting or straining until approved by your doctor. °a. You may drive when you are no longer taking prescription pain medication, you can comfortably wear a seatbelt, and you can safely maneuver your car and apply brakes. °b. RETURN TO WORK:  __________________________________________________________ °9. You should see your doctor in the office for a follow-up appointment approximately 2-3 weeks after your surgery.  Make sure that you call for this appointment within a day or two after you arrive home to insure a convenient appointment time. °10. OTHER INSTRUCTIONS: __________________________________________________________________________________________________________________________ __________________________________________________________________________________________________________________________ °WHEN TO CALL YOUR DOCTOR: °1. Fever over 101.0 °2. Inability to urinate °3. Continued bleeding from incision. °4. Increased pain, redness, or drainage from the incision. °5. Increasing abdominal pain ° °The clinic staff is available to answer your questions during regular business hours.  Please don’t hesitate to call and ask to speak to one of the nurses for clinical concerns.  If you have a medical emergency, go to the nearest emergency room or call 911.  A surgeon from [facility] is always on call at the hospital. °[address], [address], [place], [place]  [address] ? [address] [address], [place],    [address] °[phone] ? [phone] ? [phone] [phone] °Web site:   [email or web address] °

## 2019-08-03 LAB — SURGICAL PATHOLOGY

## 2021-01-25 ENCOUNTER — Other Ambulatory Visit: Payer: Self-pay

## 2021-01-25 ENCOUNTER — Encounter: Payer: Self-pay | Admitting: Neurology

## 2021-01-25 ENCOUNTER — Ambulatory Visit: Payer: Medicare HMO | Admitting: Neurology

## 2021-01-25 VITALS — BP 166/79 | HR 74 | Ht 67.5 in | Wt 186.0 lb

## 2021-01-25 DIAGNOSIS — R208 Other disturbances of skin sensation: Secondary | ICD-10-CM

## 2021-01-25 DIAGNOSIS — G5621 Lesion of ulnar nerve, right upper limb: Secondary | ICD-10-CM

## 2021-01-25 DIAGNOSIS — R200 Anesthesia of skin: Secondary | ICD-10-CM

## 2021-01-25 NOTE — Patient Instructions (Signed)
Nerve testing of the right arm and left leg.   Try to avoid resting on your elbow  ELECTROMYOGRAM AND NERVE CONDUCTION STUDIES (EMG/NCS) INSTRUCTIONS  How to Prepare The neurologist conducting the EMG will need to know if you have certain medical conditions. Tell the neurologist and other EMG lab personnel if you: Have a pacemaker or any other electrical medical device Take blood-thinning medications Have hemophilia, a blood-clotting disorder that causes prolonged bleeding Bathing Take a shower or bath shortly before your exam in order to remove oils from your skin. Don't apply lotions or creams before the exam.  What to Expect You'll likely be asked to change into a hospital gown for the procedure and lie down on an examination table. The following explanations can help you understand what will happen during the exam.  Electrodes. The neurologist or a technician places surface electrodes at various locations on your skin depending on where you're experiencing symptoms. Or the neurologist may insert needle electrodes at different sites depending on your symptoms.  Sensations. The electrodes will at times transmit a tiny electrical current that you may feel as a twinge or spasm. The needle electrode may cause discomfort or pain that usually ends shortly after the needle is removed. If you are concerned about discomfort or pain, you may want to talk to the neurologist about taking a short break during the exam.  Instructions. During the needle EMG, the neurologist will assess whether there is any spontaneous electrical activity when the muscle is at rest - activity that isn't present in healthy muscle tissue - and the degree of activity when you slightly contract the muscle.  He or she will give you instructions on resting and contracting a muscle at appropriate times. Depending on what muscles and nerves the neurologist is examining, he or she may ask you to change positions during the exam.   After your EMG You may experience some temporary, minor bruising where the needle electrode was inserted into your muscle. This bruising should fade within several days. If it persists, contact your primary care doctor.

## 2021-01-25 NOTE — Progress Notes (Signed)
Follow-up Visit   Date: 01/25/21   [name] MRN: [id number] DOB: 17-Apr-1933   Interim History: [name] is a 87 y.o. right-handed  male with diabetes mellitus, essential tremor, depression/anxiety, hyperlipidemia, insomnia returning to the clinic for follow-up of left foot numbness.  The patient was accompanied to the clinic by self.  History of present illness: Starting around the fall of 2019, he began having numbness and swelling of the left foot.  Symptoms are constant and involves the top of his left foot.  He has tingling over the soles of the feet.  He has some imbalance, no falls and he walks unassisted.  He also complains of achy low back pain.  Prolonged standing or walking does not exacerbate his numbness.  He does not have similar symptoms on the right foot.  UPDATE 01/25/2021:  For the past year, he has noticed burning over the right 4th and 5th fingers up to his elbow.  It starts mid-day usually when he is sitting at his recliner and continues throughout the evening.  He does not experience it in the morning.  He denies weakness in the hand or neck pain.   He continues to have left foot numbness over the top of the foot.  No weakness in the feet or imbalance.  No similar symptoms in the right foot.  MRI lumbar spine from June 2020 shows multilevel lumbar spondylosis with left lateral recess stenosis at L3-4 and L2-3 (moderate) and right neuroforaminal stenosis at L4-5 on the right.   Medications:  Current Outpatient Medications on File Prior to Visit  Medication Sig Dispense Refill   amLODipine (NORVASC) 10 MG tablet Take 10 mg by mouth daily.      aspirin 81 MG chewable tablet Chew 81 mg by mouth daily as needed for moderate pain.      buPROPion (WELLBUTRIN XL) 300 MG 24 hr tablet Take 300 mg by mouth daily.  6   gabapentin (NEURONTIN) 400 MG capsule Take 400 mg by mouth 3 (three) times daily.     LORazepam (ATIVAN) 0.5 MG tablet Take  0.5 mg by mouth 2 (two) times daily.      Multiple Vitamins-Minerals (PRESERVISION AREDS 2 PO) Take by mouth.     Omega-3 Fatty Acids (FISH OIL) 1000 MG CAPS Take by mouth.     omeprazole (PRILOSEC) 40 MG capsule Take 40 mg by mouth daily.     primidone (MYSOLINE) 50 MG tablet Take 50 mg by mouth 2 (two) times daily.     tamsulosin (FLOMAX) 0.4 MG CAPS capsule Take 0.8 mg by mouth daily after supper.      temazepam (RESTORIL) 30 MG capsule Take 30 mg by mouth at bedtime.      docusate sodium (COLACE) 100 MG capsule Take 1 capsule (100 mg total) by mouth daily. (Patient not taking: Reported on 01/25/2021) 10 capsule 0   Multiple Vitamin (MULITIVITAMIN WITH MINERALS) TABS Take 1 tablet by mouth daily.   (Patient not taking: Reported on 01/25/2021)     oxyCODONE (OXY IR/ROXICODONE) 5 MG immediate release tablet Take 1 tablet (5 mg total) by mouth every 4 (four) hours as needed for moderate pain. (Patient not taking: Reported on 01/25/2021) 15 tablet 0   polyethylene glycol (MIRALAX / GLYCOLAX) 17 g packet Take 17 g by mouth daily. (Patient not taking: Reported on 01/25/2021) 14 each 0   Vitamin D, Ergocalciferol, (DRISDOL) 1.25 MG (50000 UNIT) CAPS capsule Take 50,000 Units by mouth every 7 (  seven) days.  (Patient not taking: Reported on 01/25/2021)     No current facility-administered medications on file prior to visit.    Allergies: No Known Allergies  Vital Signs:  BP (!) 166/79   Pulse 74   Ht 5' 7.5" (1.715 m)   Wt 186 lb (84.4 kg)   SpO2 97%   BMI 28.70 kg/m    Neurological Exam: MENTAL STATUS including orientation to time, place, person, recent and remote memory, attention span and concentration, language, and fund of knowledge is normal.  Speech is not dysarthric.  CRANIAL NERVES:  No visual field defects.  Pupils equal round and reactive to light.  Normal conjugate, extra-ocular eye movements in all directions of gaze.  No ptosis.    MOTOR:  Generalized tremors of the hands and  head, worse with action. No atrophy, fasciculations.  No pronator drift.   Upper Extremity:  Right  Left  Deltoid  5/5   5/5   Biceps  5/5   5/5   Triceps  5/5   5/5   Infraspinatus 5/5  5/5  Medial pectoralis 5/5  5/5  Wrist extensors  5/5   5/5   Wrist flexors  5/5   5/5   Finger extensors  5/5   5/5   Finger flexors  5/5   5/5   Dorsal interossei  4/5   5/5   Abductor pollicis  5/5   5/5   Tone (Ashworth scale)  0  0   Lower Extremity:  Right  Left  Hip flexors  5/5   5/5   Hip extensors  5/5   5/5   Adductor 5/5  5/5  Abductor 5/5  5/5  Knee flexors  5/5   5/5   Knee extensors  5/5   5/5   Dorsiflexors  5/5   5/5   Plantarflexors  5/5   5/5   Toe extensors  5/5   5/5   Toe flexors  5/5   5/5   Tone (Ashworth scale)  0  0    MSRs:  Reflexes are 2+/4 throughout, except 1+ bilateral ankles  SENSORY:  Intact to vibration and temperature throughout.  COORDINATION/GAIT:  Normal finger-to- nose-finger.  Intact rapid alternating movements bilaterally.  Gait narrow based and stable.   Data: MRI lumbar spine wo contrast 12/06/2018: 1. Multilevel lumbar spondylosis as described above, similar to prior study. Unchanged moderate left lateral recess and moderate to severe left neuroforaminal stenosis at L3-L4. 2. Unchanged severe right and moderate left lateral recess stenosis at L2-L3. 3. Unchanged moderate right neuroforaminal stenosis at L4-L5.  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 Left foot numbness ?superficial peroneal neuropathy, no evidence of L5 radiculopathy  - NCS/EMG of the left leg  3.  Essential tremor  - continue primidone '50mg'$  BID  Further recommendations pending results.   Thank you for allowing me to participate in patient's care.  If I can answer any additional questions, I would be pleased to do so.     Sincerely,    Endy Easterly K. Posey Pronto, DO

## 2021-02-13 ENCOUNTER — Ambulatory Visit: Payer: Medicare HMO | Admitting: Neurology

## 2021-02-13 ENCOUNTER — Other Ambulatory Visit: Payer: Self-pay

## 2021-02-13 DIAGNOSIS — G5621 Lesion of ulnar nerve, right upper limb: Secondary | ICD-10-CM | POA: Diagnosis not present

## 2021-02-13 DIAGNOSIS — R200 Anesthesia of skin: Secondary | ICD-10-CM

## 2021-02-13 DIAGNOSIS — G5601 Carpal tunnel syndrome, right upper limb: Secondary | ICD-10-CM

## 2021-02-13 DIAGNOSIS — R208 Other disturbances of skin sensation: Secondary | ICD-10-CM

## 2021-02-13 NOTE — Procedures (Signed)
[facility]  [address], [address] [place]  [place], [place] [address] Tel: [phone] Fax:  [phone] Test Date:  02/13/2021  Patient: [name] DOB: 15-Oct-1932 Physician: Narda Amber, DO  Sex: Male Height: '5\' 7"'$  Ref Phys: Narda Amber, DO  ID#: [id number]   Technician:    Patient Complaints: This is a 87 year old man referred for evaluation of right hand and left foot paresthesias  NCV & EMG Findings: Extensive electrodiagnostic testing of the right upper extremity and additional studies of the left lower extremity shows:  Right median sensory response shows prolonged latency (4.2 ms) right ulnar, left sural, and left superficial peroneal sensory responses are within normal limits. Right median motor response shows prolonged latency (4.8 ms) and reduced amplitude (3.4 mV).  Right ulnar motor response shows slowed conduction velocity across the elbow (A Elbow-B Elbow, 43 m/s).  Left peroneal motor response at the extensor digitorum brevis is reduced, and normal at the tibialis anterior.  Left tibial motor responses within normal limits. Left tibial H reflex study is within normal limits. The right upper extremity, chronic motor axonal loss changes are isolated to the right abductor pollicis brevis muscle. In the left lower extremity, there is no evidence of active or chronic motor axonal loss changes affecting any of the tested muscles.  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   Right ulnar neuropathy with slowing across the elbow, purely demyelinating, mild. There is no evidence of a sensorimotor polyneuropathy or lumbosacral radiculopathy involving the left lower extremity.    ___________________________ Narda Amber, DO    Nerve Conduction Studies Anti Sensory Summary Table   Stim Site NR Peak (ms) Norm Peak (ms) P-T Amp (V) Norm P-T Amp  Right  Median Anti Sensory (2nd Digit)  33C  Wrist    4.2 <3.8 26.7 >10  Left Sup Peroneal Anti Sensory (Ant Lat Mall)  33C  12 cm    3.5 <4.6 6.8 >3  Left Sural Anti Sensory (Lat Mall)  33C  Calf    2.7 <4.6 4.0 >3  Right Ulnar Anti Sensory (5th Digit)  33C  Wrist    3.2 <3.2 15.4 >5   Motor Summary Table   Stim Site NR Onset (ms) Norm Onset (ms) O-P Amp (mV) Norm O-P Amp Site1 Site2 Delta-0 (ms) Dist (cm) Vel (m/s) Norm Vel (m/s)  Right Median Motor (Abd Poll Brev)  33C  Wrist    4.8 <4.0 3.4 >5 Elbow Wrist 5.1 29.0 57 >50  Elbow    9.9  3.1         Left Peroneal Motor (Ext Dig Brev)  33C  Ankle    3.1 <6.0 1.2 >2.5 B Fib Ankle 8.4 36.0 43 >40  B Fib    11.5  1.1  Poplt B Fib 1.8 8.0 44 >40  Poplt    13.3  1.0         Left Peroneal TA Motor (Tib Ant)  33C  Fib Head    3.0 <4.5 3.4 >3 Poplit Fib Head 1.3 8.0 62 >40  Poplit    4.3  3.2         Left Tibial Motor (Abd Hall Brev)  33C  Ankle    3.9 <6.0 5.2 >4 Knee Ankle 8.3 42.0 51 >40  Knee    12.2  3.4         Right Ulnar Motor (Abd Dig Minimi)  33C  Wrist    2.4 <3.1 7.3 >7 B Elbow Wrist 3.5 22.0 63 >50  B Elbow    5.9  7.0  A Elbow B Elbow 2.3 10.0 43 >50  A Elbow    8.2  6.6          H Reflex Studies   NR H-Lat (ms) Lat Norm (ms) L-R H-Lat (ms)  Left Tibial (Gastroc)  33C     32.93 <35    EMG   Side Muscle Ins Act Fibs Psw Fasc Number Recrt Dur Dur. Amp Amp. Poly Poly. Comment  Right 1stDorInt Nml Nml Nml Nml Nml Nml Nml Nml Nml Nml Nml Nml N/A  Right Abd Poll Brev Nml Nml Nml Nml 1- Rapid Some 1+ Some 1+ Some 1+ N/A  Right PronatorTeres Nml Nml Nml Nml Nml Nml Nml Nml Nml Nml Nml Nml N/A  Right Biceps Nml Nml Nml Nml Nml Nml Nml Nml Nml Nml Nml Nml N/A  Right Triceps Nml Nml Nml Nml Nml Nml Nml Nml Nml Nml Nml Nml N/A  Right Deltoid Nml Nml Nml Nml Nml Nml Nml Nml Nml Nml Nml Nml N/A  Right FlexCarpiUln Nml Nml Nml Nml Nml Nml Nml Nml Nml Nml Nml Nml N/A  Left AntTibialis Nml Nml Nml Nml Nml Nml Nml Nml Nml Nml  Nml Nml N/A  Left Gastroc Nml Nml Nml Nml Nml Nml Nml Nml Nml Nml Nml Nml N/A  Left Flex Dig Long Nml Nml Nml Nml Nml Nml Nml Nml Nml Nml Nml Nml N/A  Left RectFemoris Nml Nml Nml Nml Nml Nml Nml Nml Nml Nml Nml Nml N/A  Left GluteusMed Nml Nml Nml Nml Nml Nml Nml Nml Nml Nml Nml Nml N/A      Waveforms:

## 2021-02-14 NOTE — Addendum Note (Signed)
Addended by: Forbes Cellar on: 02/14/2021 01:43 PM   Modules accepted: Orders

## 2021-02-20 ENCOUNTER — Ambulatory Visit
Admission: RE | Admit: 2021-02-20 | Discharge: 2021-02-20 | Disposition: A | Discharge status: Home / Self Care | Payer: Medicare HMO | Source: Ambulatory Visit | Attending: Neurology | Admitting: Neurology

## 2021-02-20 ENCOUNTER — Other Ambulatory Visit: Payer: Self-pay

## 2021-02-20 DIAGNOSIS — R200 Anesthesia of skin: Secondary | ICD-10-CM

## 2021-02-20 DIAGNOSIS — R208 Other disturbances of skin sensation: Secondary | ICD-10-CM

## 2021-02-22 ENCOUNTER — Telehealth: Payer: Self-pay | Admitting: Neurology

## 2021-02-22 NOTE — Telephone Encounter (Signed)
Pt is returning a call to mahina 

## 2021-02-22 NOTE — Progress Notes (Signed)
Called patient and left a message for a call back.  

## 2021-02-25 NOTE — Telephone Encounter (Signed)
Results Per Dr. Posey Pronto-  Please inform pt that his vascular studies show borderline abnormal flow to the left foot, which may explain his numbness.  Since his neurological testing did not disclose a reason for his symptoms, I recommend that we refer him to vascular & vein specialists for management of peripheral arterial disease.

## 2021-02-25 NOTE — Telephone Encounter (Signed)
Called patient and left a message for a call back.  

## 2021-02-25 NOTE — Telephone Encounter (Signed)
Pt is returning mahina's call.

## 2021-02-26 ENCOUNTER — Other Ambulatory Visit: Payer: Self-pay

## 2021-02-26 DIAGNOSIS — R200 Anesthesia of skin: Secondary | ICD-10-CM

## 2021-02-26 DIAGNOSIS — R208 Other disturbances of skin sensation: Secondary | ICD-10-CM

## 2021-02-26 NOTE — Telephone Encounter (Signed)
Patient advised of Dr.Patels report, sent referral to [facility] at [facility], patient verbalized understanding and thanked me for calling.

## 2021-02-27 ENCOUNTER — Encounter: Payer: Medicare HMO | Admitting: Neurology

## 2021-03-07 ENCOUNTER — Other Ambulatory Visit: Payer: Self-pay

## 2021-03-07 DIAGNOSIS — R600 Localized edema: Secondary | ICD-10-CM

## 2021-03-12 ENCOUNTER — Other Ambulatory Visit: Payer: Self-pay

## 2021-03-12 ENCOUNTER — Other Ambulatory Visit: Payer: Self-pay | Admitting: Vascular Surgery

## 2021-03-12 ENCOUNTER — Ambulatory Visit (HOSPITAL_COMMUNITY)
Admission: RE | Admit: 2021-03-12 | Discharge: 2021-03-12 | Disposition: A | Discharge status: Home / Self Care | Payer: Medicare HMO | Source: Ambulatory Visit | Attending: Vascular Surgery | Admitting: Vascular Surgery

## 2021-03-12 ENCOUNTER — Encounter: Payer: Self-pay | Admitting: Vascular Surgery

## 2021-03-12 ENCOUNTER — Ambulatory Visit: Payer: Medicare HMO | Admitting: Vascular Surgery

## 2021-03-12 DIAGNOSIS — R200 Anesthesia of skin: Secondary | ICD-10-CM

## 2021-03-12 DIAGNOSIS — R202 Paresthesia of skin: Secondary | ICD-10-CM | POA: Diagnosis not present

## 2021-03-12 DIAGNOSIS — R208 Other disturbances of skin sensation: Secondary | ICD-10-CM | POA: Insufficient documentation

## 2021-03-12 DIAGNOSIS — M7989 Other specified soft tissue disorders: Secondary | ICD-10-CM | POA: Diagnosis not present

## 2021-03-12 NOTE — Progress Notes (Signed)
Patient name: [name] MRN: [id number] DOB: Jul 13, 1932 Sex: male  REASON FOR VISIT: Evaluate left foot numbness and left leg swelling  HPI: [name] is a 87 y.o. male with history of hypertension, hyperlipidemia, diabetes that presents for evaluation of left foot numbness and left leg swelling.  He states he has had numbness and swelling in the left foot for years.  He was previously evaluated by Dr. Donnetta Hutching in 2020 with no clear vascular etiology.  States the numbness is on top of the left foot.  Bothers him all the time.  He has been evaluated by neurologist and told that he had normal nerve study in the left leg.  He has also had a compression stocking that has been wearing.  Previous reflux study showed no evidence of venous insufficiency in the left leg in 2020 in either the deep or superficial system.  Past Medical History:  Diagnosis Date   Anxiety    Cataract    Depression    Diabetes mellitus    GERD (gastroesophageal reflux disease)    Hyperlipemia    Hypertension     Past Surgical History:  Procedure Laterality Date   CHOLECYSTECTOMY N/A 07/30/2019   Procedure: LAPAROSCOPIC CHOLECYSTECTOMY;  Surgeon: [name], Arta Bruce, MD;  Location: [facility]S;  Service: General;  Laterality: N/A;   FRACTURE SURGERY     HERNIA REPAIR      Family History  Problem Relation Age of Onset   Lung cancer Brother     SOCIAL HISTORY: Social History   Tobacco Use   Smoking status: Never   Smokeless tobacco: Never  Substance Use Topics   Alcohol use: No    No Known Allergies  Current Outpatient Medications  Medication Sig Dispense Refill   amLODipine (NORVASC) 10 MG tablet Take 10 mg by mouth daily.      aspirin 81 MG chewable tablet Chew 81 mg by mouth daily as needed for moderate pain.      buPROPion (WELLBUTRIN XL) 300 MG 24 hr tablet Take 300 mg by mouth daily.  6   LORazepam (ATIVAN) 0.5 MG tablet Take 0.5 mg by mouth 2 (two) times daily.       Multiple Vitamin (MULITIVITAMIN WITH MINERALS) TABS Take 1 tablet by mouth daily.     Multiple Vitamins-Minerals (PRESERVISION AREDS 2 PO) Take by mouth.     Omega-3 Fatty Acids (FISH OIL) 1000 MG CAPS Take by mouth.     primidone (MYSOLINE) 50 MG tablet Take 50 mg by mouth 2 (two) times daily.     temazepam (RESTORIL) 30 MG capsule Take 30 mg by mouth at bedtime.      docusate sodium (COLACE) 100 MG capsule Take 1 capsule (100 mg total) by mouth daily. (Patient not taking: No sig reported) 10 capsule 0   gabapentin (NEURONTIN) 400 MG capsule Take 400 mg by mouth 3 (three) times daily. (Patient not taking: Reported on 03/12/2021)     omeprazole (PRILOSEC) 40 MG capsule Take 40 mg by mouth daily. (Patient not taking: Reported on 03/12/2021)     oxyCODONE (OXY IR/ROXICODONE) 5 MG immediate release tablet Take 1 tablet (5 mg total) by mouth every 4 (four) hours as needed for moderate pain. (Patient not taking: No sig reported) 15 tablet 0   polyethylene glycol (MIRALAX / GLYCOLAX) 17 g packet Take 17 g by mouth daily. (Patient not taking: No sig reported) 14 each 0   tamsulosin (FLOMAX) 0.4 MG CAPS capsule Take 0.8 mg by  mouth daily after supper.  (Patient not taking: Reported on 03/12/2021)     Vitamin D, Ergocalciferol, (DRISDOL) 1.25 MG (50000 UNIT) CAPS capsule Take 50,000 Units by mouth every 7 (seven) days.  (Patient not taking: No sig reported)     No current facility-administered medications for this visit.    REVIEW OF SYSTEMS:  '[X]'$  denotes positive finding, '[ ]'$  denotes negative finding Cardiac  Comments:  Chest pain or chest pressure:    Shortness of breath upon exertion:    Short of breath when lying flat:    Irregular heart rhythm:        Vascular    Pain in calf, thigh, or hip brought on by ambulation:    Pain in feet at night that wakes you up from your sleep:     Blood clot in your veins:    Leg swelling:  x Left leg      Pulmonary    Oxygen at home:    Productive cough:      Wheezing:         Neurologic    Sudden weakness in arms or legs:     Sudden numbness in arms or legs:     Sudden onset of difficulty speaking or slurred speech:    Temporary loss of vision in one eye:     Problems with dizziness:         Gastrointestinal    Blood in stool:     Vomited blood:         Genitourinary    Burning when urinating:     Blood in urine:        Psychiatric    Major depression:         Hematologic    Bleeding problems:    Problems with blood clotting too easily:        Skin    Rashes or ulcers:        Constitutional    Fever or chills:      PHYSICAL EXAM: Vitals:   03/12/21 1117  BP: 114/65  Pulse: 60  Resp: 18  Temp: 97.6 F (36.4 C)  TempSrc: Temporal  SpO2: 93%  Weight: 185 lb 12.8 oz (84.3 kg)  Height: 5' 6.5" (1.689 m)    GENERAL: The patient is a well-nourished male, in no acute distress. The vital signs are documented above. CARDIAC: There is a regular rate and rhythm.  VASCULAR:  Palpable DP pulses bilaterally No active tissue loss Feet warm PULMONARY: No respiratory distress. ABDOMEN: Soft and non-tender. MUSCULOSKELETAL: There are no major deformities or cyanosis. NEUROLOGIC: No focal weakness or paresthesias are detected. SKIN: There are no ulcers or rashes noted. PSYCHIATRIC: The patient has a normal affect.  DATA:   Lower Venous Reflux Study   Indications: Pain and edema of left ankle     Performing Technologist: Ronal Fear RVS, RCS      Examination Guidelines: A complete evaluation includes B-mode imaging,  spectral  Doppler, color Doppler, and power Doppler as needed of all accessible  portions  of each vessel. Bilateral testing is considered an integral part of a  complete  examination. Limited examinations for reoccurring indications may be  performed  as noted. The reflux portion of the exam is performed with the patient in  reverse Trendelenburg.           +------------------------------+----------+---------+  VEIN DIAMETERS:               Right (cm)Left (cm)  +------------------------------+----------+---------+  GSV at Saphenofemoral junction          0.57       +------------------------------+----------+---------+  GSV at prox thigh                       0.25       +------------------------------+----------+---------+  GSV at mid thigh                        0.20       +------------------------------+----------+---------+  GSV at distal thigh                     0.20       +------------------------------+----------+---------+  GSV at knee                             0.22       +------------------------------+----------+---------+  SSV prox                                0.24       +------------------------------+----------+---------+        Summary:  Left: No reflux was noted in the common femoral vein, femoral vein in the  thigh, and popliteal vein. No reflux involving the superficial system.  There is no evidence of deep vein thrombosis in the lower extremity. There  is no evidence of superficial  venous thrombosis.     *See table(s) above for measurements and observations.   Assessment/Plan:  87 year old male presents for evaluation of left leg swelling with foot numbness.  He had ABIs prior to the visit today that are normal and 0.96 on the left with biphasic waveform at the ankle and no signs of arterial insufficiency.  In addition he has a palpable DP pulse in the left foot.  I do not feel his numbness is related to a vascular etiology.  We discussed his leg swelling and he already had reflux study in 2020 that showed no evidence of reflux in either the deep or superficial system.  He has been wearing a compression stocking and elevating his leg appropriately.  I discussed could repeat a reflux study of the left leg but again there was no previous evidence of venous insufficiency and this is  unlikely to be the etiology for his leg swelling has been chronic for years.  Follow-up with me as needed.   Marty Heck, MD [facility] Office: [phone]

## 2022-08-13 ENCOUNTER — Emergency Department (HOSPITAL_COMMUNITY)
Admission: EM | Admit: 2022-08-13 | Discharge: 2022-08-13 | Disposition: A | Discharge status: Home / Self Care | Payer: Medicare HMO | Attending: Emergency Medicine | Admitting: Emergency Medicine

## 2022-08-13 ENCOUNTER — Emergency Department (HOSPITAL_COMMUNITY): Payer: Medicare HMO

## 2022-08-13 DIAGNOSIS — K253 Acute gastric ulcer without hemorrhage or perforation: Secondary | ICD-10-CM | POA: Diagnosis not present

## 2022-08-13 DIAGNOSIS — R109 Unspecified abdominal pain: Secondary | ICD-10-CM | POA: Diagnosis present

## 2022-08-13 LAB — CBC WITH DIFFERENTIAL/PLATELET
Abs Immature Granulocytes: 0.04 10*3/uL (ref 0.00–0.07)
Basophils Absolute: 0 10*3/uL (ref 0.0–0.1)
Basophils Relative: 0 %
Eosinophils Absolute: 0.1 10*3/uL (ref 0.0–0.5)
Eosinophils Relative: 1 %
HCT: 38 % — ABNORMAL LOW (ref 39.0–52.0)
Hemoglobin: 12.3 g/dL — ABNORMAL LOW (ref 13.0–17.0)
Immature Granulocytes: 1 %
Lymphocytes Relative: 13 %
Lymphs Abs: 0.9 10*3/uL (ref 0.7–4.0)
MCH: 28.5 pg (ref 26.0–34.0)
MCHC: 32.4 g/dL (ref 30.0–36.0)
MCV: 88 fL (ref 80.0–100.0)
Monocytes Absolute: 0.4 10*3/uL (ref 0.1–1.0)
Monocytes Relative: 6 %
Neutro Abs: 5.5 10*3/uL (ref 1.7–7.7)
Neutrophils Relative %: 79 %
Platelets: 357 10*3/uL (ref 150–400)
RBC: 4.32 MIL/uL (ref 4.22–5.81)
RDW: 13.5 % (ref 11.5–15.5)
WBC: 6.9 10*3/uL (ref 4.0–10.5)
nRBC: 0 % (ref 0.0–0.2)

## 2022-08-13 LAB — URINALYSIS, ROUTINE W REFLEX MICROSCOPIC
Bilirubin Urine: NEGATIVE
Glucose, UA: NEGATIVE mg/dL
Hgb urine dipstick: NEGATIVE
Ketones, ur: NEGATIVE mg/dL
Leukocytes,Ua: NEGATIVE
Nitrite: NEGATIVE
Protein, ur: NEGATIVE mg/dL
Specific Gravity, Urine: 1.01 (ref 1.005–1.030)
pH: 7.5 (ref 5.0–8.0)

## 2022-08-13 LAB — COMPREHENSIVE METABOLIC PANEL
ALT: 17 U/L (ref 0–44)
AST: 20 U/L (ref 15–41)
Albumin: 3.3 g/dL — ABNORMAL LOW (ref 3.5–5.0)
Alkaline Phosphatase: 101 U/L (ref 38–126)
Anion gap: 11 (ref 5–15)
BUN: 17 mg/dL (ref 8–23)
CO2: 30 mmol/L (ref 22–32)
Calcium: 9.6 mg/dL (ref 8.9–10.3)
Chloride: 93 mmol/L — ABNORMAL LOW (ref 98–111)
Creatinine, Ser: 1.08 mg/dL (ref 0.61–1.24)
GFR, Estimated: >60 mL/min (ref >60)
Glucose, Bld: 142 mg/dL — ABNORMAL HIGH (ref 70–99)
Potassium: 3.2 mmol/L — ABNORMAL LOW (ref 3.5–5.1)
Sodium: 134 mmol/L — ABNORMAL LOW (ref 135–145)
Total Bilirubin: 0.1 mg/dL — ABNORMAL LOW (ref 0.3–1.2)
Total Protein: 6.6 g/dL (ref 6.5–8.1)

## 2022-08-13 LAB — LIPASE, BLOOD: Lipase: 32 U/L (ref 11–51)

## 2022-08-13 MED ORDER — SODIUM CHLORIDE 0.9 % IV BOLUS
1000.0000 mL | Freq: Once | INTRAVENOUS | Status: AC
  Administered 2022-08-13: 1000 mL via INTRAVENOUS

## 2022-08-13 MED ORDER — ONDANSETRON HCL 4 MG/2ML IJ SOLN
4.0000 mg | Freq: Once | INTRAMUSCULAR | Status: AC
  Administered 2022-08-13: 4 mg via INTRAVENOUS
  Filled 2022-08-13: qty 2

## 2022-08-13 MED ORDER — IOHEXOL 350 MG/ML SOLN
75.0000 mL | Freq: Once | INTRAVENOUS | Status: AC | PRN
  Administered 2022-08-13: 75 mL via INTRAVENOUS

## 2022-08-13 MED ORDER — MORPHINE SULFATE (PF) 2 MG/ML IV SOLN
2.0000 mg | Freq: Once | INTRAVENOUS | Status: AC
  Administered 2022-08-13: 2 mg via INTRAVENOUS
  Filled 2022-08-13: qty 1

## 2022-08-13 NOTE — ED Triage Notes (Signed)
Pt bib EMS from [facility]. Pt reports lower abdominal pain x 3 days. He reports BM today was dark but this is normal for him due to medications. Denies N/V/D.

## 2022-08-13 NOTE — Discharge Instructions (Addendum)
You had a large stomach ulcer that was seen on CT scan.  Usually this has to be pretty significant to be seen on CT.  I would have you continue to take your omeprazole, you need to take it at the same time every day and under the same circumstances or otherwise it does not work properly.   Try to avoid things that may make this worse, most commonly these are spicy foods tomato based products fatty foods chocolate and peppermint.  Alcohol and tobacco can also make this worse.  Return to the emergency department for sudden worsening pain fever or inability to eat or drink.  There are also signs that you have some inflammation from an old hernia repair.  He did not have any obvious tenderness there for me on exam.  Please discuss this with your family doctor as they may want to send you back to a general surgeon to have it evaluated.  They also saw that you have a possible mass on your left kidney.  This looked a bit larger than it was last time it was imaged.  Discussed this with your family doctor and they may choose to perform a dedicated imaging study for it to her might send you to a urologist to have it better evaluated.

## 2022-08-13 NOTE — ED Provider Notes (Signed)
[facility] Provider Note   CSN: [id number] Arrival date & time: 08/13/22  1541     History  Chief Complaint  Patient presents with   Abdominal Pain    [name] is a 89 y.o. male.  89 yo M with a chief complaint of abdominal pain.  This been going on for couple days now.  Seems to come and go.  Was much worse today than it has been.  Seems occur a couple hours after eating.  Mostly to the lower portion of the abdomen.  Went to see his family doctor today and had 1 dark bowel movement and was sent here for evaluation.  He denies fevers denies nausea denies vomiting.   Abdominal Pain      Home Medications Prior to Admission medications   Medication Sig Start Date End Date Taking? Authorizing Provider  amLODipine (NORVASC) 10 MG tablet Take 10 mg by mouth daily.  07/08/19   [provider]  aspirin 81 MG chewable tablet Chew 81 mg by mouth daily as needed for moderate pain.  05/07/18   [provider]  buPROPion (WELLBUTRIN XL) 300 MG 24 hr tablet Take 300 mg by mouth daily. 11/14/16   [provider]  docusate sodium (COLACE) 100 MG capsule Take 1 capsule (100 mg total) by mouth daily. Patient not taking: No sig reported 08/02/19   Jackson Latino L, PA  gabapentin (NEURONTIN) 400 MG capsule Take 400 mg by mouth 3 (three) times daily. Patient not taking: Reported on 03/12/2021 05/30/19   [provider]  LORazepam (ATIVAN) 0.5 MG tablet Take 0.5 mg by mouth 2 (two) times daily.     [provider]  Multiple Vitamin (MULITIVITAMIN WITH MINERALS) TABS Take 1 tablet by mouth daily.    [provider]  Multiple Vitamins-Minerals (PRESERVISION AREDS 2 PO) Take by mouth.    [provider]  Omega-3 Fatty Acids (FISH OIL) 1000 MG CAPS Take by mouth.    [provider]  omeprazole (PRILOSEC) 40 MG capsule Take 40 mg by mouth daily. Patient not taking: Reported on  03/12/2021 06/30/19   [provider]  oxyCODONE (OXY IR/ROXICODONE) 5 MG immediate release tablet Take 1 tablet (5 mg total) by mouth every 4 (four) hours as needed for moderate pain. Patient not taking: No sig reported 08/02/19   Jackson Latino L, PA  polyethylene glycol (MIRALAX / GLYCOLAX) 17 g packet Take 17 g by mouth daily. Patient not taking: No sig reported 08/02/19   Jackson Latino L, PA  primidone (MYSOLINE) 50 MG tablet Take 50 mg by mouth 2 (two) times daily.    [provider]  tamsulosin (FLOMAX) 0.4 MG CAPS capsule Take 0.8 mg by mouth daily after supper.  Patient not taking: Reported on 03/12/2021    [provider]  temazepam (RESTORIL) 30 MG capsule Take 30 mg by mouth at bedtime.  03/28/14   [provider]  Vitamin D, Ergocalciferol, (DRISDOL) 1.25 MG (50000 UNIT) CAPS capsule Take 50,000 Units by mouth every 7 (seven) days.  Patient not taking: No sig reported 06/30/19   [provider]      Allergies    Patient has no known allergies.    Review of Systems   Review of Systems  Gastrointestinal:  Positive for abdominal pain.    Physical Exam Updated Vital Signs BP (!) 144/63 (BP Location: Right Arm)   Pulse 66   Temp 97.6 F (36.4  C) (Oral)   Resp 17   SpO2 92%  Physical Exam Vitals and nursing note reviewed.  Constitutional:      Appearance: He is well-developed.  HENT:     Head: Normocephalic and atraumatic.  Eyes:     Pupils: Pupils are equal, round, and reactive to light.  Neck:     Vascular: No JVD.  Cardiovascular:     Rate and Rhythm: Normal rate and regular rhythm.     Heart sounds: No murmur heard.    No friction rub. No gallop.  Pulmonary:     Effort: No respiratory distress.     Breath sounds: No wheezing.  Abdominal:     General: There is no distension.     Tenderness: There is no abdominal tenderness. There is no guarding or rebound.     Comments: No obvious abdominal tenderness to palpation.  He  points to the lower aspect of his abdomen.  Musculoskeletal:        General: Normal range of motion.     Cervical back: Normal range of motion and neck supple.  Skin:    Coloration: Skin is not pale.     Findings: No rash.  Neurological:     Mental Status: He is alert and oriented to person, place, and time.  Psychiatric:        Behavior: Behavior normal.     ED Results / Procedures / Treatments   Labs (all labs ordered are listed, but only abnormal results are displayed) Labs Reviewed  CBC WITH DIFFERENTIAL/PLATELET - Abnormal; Notable for the following components:      Result Value   Hemoglobin 12.3 (*)    HCT 38.0 (*)    All other components within normal limits  COMPREHENSIVE METABOLIC PANEL - Abnormal; Notable for the following components:   Sodium 134 (*)    Potassium 3.2 (*)    Chloride 93 (*)    Glucose, Bld 142 (*)    Albumin 3.3 (*)    Total Bilirubin 0.1 (*)    All other components within normal limits  LIPASE, BLOOD  URINALYSIS, ROUTINE W REFLEX MICROSCOPIC    EKG None  Radiology CT ABDOMEN PELVIS W CONTRAST  Result Date: 08/13/2022 CLINICAL DATA:  Abdominal pain, acute, nonlocalized. Lower abdominal pain EXAM: CT ABDOMEN AND PELVIS WITH CONTRAST TECHNIQUE: Multidetector CT imaging of the abdomen and pelvis was performed using the standard protocol following bolus administration of intravenous contrast. RADIATION DOSE REDUCTION: This exam was performed according to the departmental dose-optimization program which includes automated exposure control, adjustment of the mA and/or kV according to patient size and/or use of iterative reconstruction technique. CONTRAST:  6m OMNIPAQUE IOHEXOL 350 MG/ML SOLN COMPARISON:  07/29/2019 FINDINGS: Lower chest: No acute abnormality. Moderate coronary artery calcification. Global cardiac size within normal limits. Hepatobiliary: No focal liver abnormality is seen. Status post cholecystectomy. No biliary dilatation. Pancreas:  Unremarkable. No pancreatic ductal dilatation or surrounding inflammatory changes. Spleen: Normal in size without focal abnormality. Adrenals/Urinary Tract: The adrenal glands are unremarkable. The kidneys are normal in position. Moderate left renal cortical atrophy. Multiple simple cortical and parapelvic cysts are seen bilaterally for which no follow-up imaging is recommended. However, a single exophytic lesion is seen arising laterally from the interpolar region of the left kidney which is indeterminate measuring 40 Hounsfield units in density and demonstrating slight interval increase in size since prior examination, measuring 21 x 24 mm (previously measuring 15 x 17 mm). This may represent a hyperdense renal cyst or  solid renal mass. Scattered 2 mm nonobstructing calculi within the right kidney. No hydronephrosis. No ureteral calculi. The bladder is unremarkable. Stomach/Bowel: A cavitating ulcer is seen arising from the posterior wall of the gastric antrum at the pylorus measuring 2.7 cm in diameter in a depth of roughly 1.5 cm. There is mild surrounding inflammatory stranding. High density material seen dependently within the ulcer crater does not appear altered on delayed images and is in keeping with calcific debris. No extraluminal loculated fluid collection or gas identified. No evidence of obstruction. Inflammatory changes continue into the proximal second portion of the duodenum. The stomach, small bowel, and large bowel are otherwise unremarkable save for moderate descending and sigmoid colonic diverticulosis. Appendix normal. No free intraperitoneal gas or fluid. Vascular/Lymphatic: Aortic atherosclerosis. No enlarged abdominal or pelvic lymph nodes. Reproductive: Brachytherapy seeds noted within the prostate gland. Other: Surgical changes of probable right inguinal hernia repair are identified. There is increasing poorly circumscribed soft tissue infiltration within this region, best seen on axial  image # 93/3 and sagittal image # 97/7 which is of unclear significance. This may represent a superimposed local inflammatory process or progressive fibrotic change. No abdominal wall hernia. Musculoskeletal: No acute bone abnormality. No lytic or blastic bone lesion. Degenerative changes are seen within the lumbar spine. IMPRESSION: 1. Cavitating ulcer arising from the posterior wall of the gastric antrum at the pylorus with mild surrounding inflammatory stranding. No extraluminal gas or loculated fluid collection identified. No evidence of obstruction. Correlation with endoscopy is recommended. 2. Moderate coronary artery calcification. 3. Minimal right nonobstructing nephrolithiasis. 4. Moderate distal colonic diverticulosis. 5. Surgical changes of probable right inguinal hernia repair. Increasing poorly circumscribed infiltrative soft tissue within this region may represent a local inflammatory process or progressive fibrosis. This is not well characterized on this examination and correlation with surgical history and clinical examination is recommended. 6. Interval increase in size of a 24 mm exophytic lesion arising laterally from the interpolar region of the left kidney. This may represent a hyperdense renal cyst or solid renal mass. If indicated, this would be better assessed with contrast enhanced CT or MRI examination once the patient's acute issues have resolved. Aortic Atherosclerosis (ICD10-I70.0). Electronically Signed   By: Fidela Salisbury M.D.   On: 08/13/2022 19:51    Procedures Procedures    Medications Ordered in ED Medications  sodium chloride 0.9 % bolus 1,000 mL (1,000 mLs Intravenous New Bag/Given 08/13/22 1654)  morphine (PF) 2 MG/ML injection 2 mg (2 mg Intravenous Given 08/13/22 1656)  ondansetron (ZOFRAN) injection 4 mg (4 mg Intravenous Given 08/13/22 1656)  iohexol (OMNIPAQUE) 350 MG/ML injection 75 mL (75 mLs Intravenous Contrast Given 08/13/22 1914)    ED Course/ Medical  Decision Making/ A&P                             Medical Decision Making Amount and/or Complexity of Data Reviewed Labs: ordered. Radiology: ordered.  Risk Prescription drug management.   89 y oM with a chief complaints of abdominal pain.  Going on for about 3 days now.  Will obtain a laboratory evaluation, UA, CT abdomen pelvis.  Treat pain and nausea.  Reassess.  Patient's blood work without significant anemia, UA negative for infection, LFTs and lipase unremarkable.  CT scan of the abdomen pelvis with fairly large gastric ulcer.  I discussed the results with the patient, he tells me that is what he thought it was.  He was  waiting referral to GI.  He has been able to eat and drink here without issue.  Symptoms are much better controlled now.  He also had some incidental findings, and a large mass to the left kidney as well as some signs of inflammation about the right inguinal hernia repair.  He has no obvious tenderness about the inguinal hernia area.  Will have him follow-up with his PCP in the office.  8:44 PM:  I have discussed the diagnosis/risks/treatment options with the patient.  Evaluation and diagnostic testing in the emergency department does not suggest an emergent condition requiring admission or immediate intervention beyond what has been performed at this time.  They will follow up with PCP. We also discussed returning to the ED immediately if new or worsening sx occur. We discussed the sx which are most concerning (e.g., sudden worsening pain, fever, inability to tolerate by mouth) that necessitate immediate return. Medications administered to the patient during their visit and any new prescriptions provided to the patient are listed below.  Medications given during this visit Medications  sodium chloride 0.9 % bolus 1,000 mL (1,000 mLs Intravenous New Bag/Given 08/13/22 1654)  morphine (PF) 2 MG/ML injection 2 mg (2 mg Intravenous Given 08/13/22 1656)  ondansetron (ZOFRAN)  injection 4 mg (4 mg Intravenous Given 08/13/22 1656)  iohexol (OMNIPAQUE) 350 MG/ML injection 75 mL (75 mLs Intravenous Contrast Given 08/13/22 1914)     The patient appears reasonably screen and/or stabilized for discharge and I doubt any other medical condition or other [facility] requiring further screening, evaluation, or treatment in the ED at this time prior to discharge.          Final Clinical Impression(s) / ED Diagnoses Final diagnoses:  Acute gastric ulcer, unspecified whether gastric ulcer hemorrhage or perforation present    Rx / DC Orders ED Discharge Orders     None         Deno Etienne, DO 08/13/22 2044

## 2022-08-19 ENCOUNTER — Ambulatory Visit: Payer: Medicare HMO | Admitting: Gastroenterology

## 2022-09-18 ENCOUNTER — Encounter: Payer: Self-pay | Admitting: Gastroenterology

## 2022-09-18 ENCOUNTER — Ambulatory Visit: Payer: Medicare HMO | Admitting: Gastroenterology

## 2022-09-18 VITALS — BP 154/70 | HR 72 | Ht 65.0 in | Wt 187.2 lb

## 2022-09-18 DIAGNOSIS — R933 Abnormal findings on diagnostic imaging of other parts of digestive tract: Secondary | ICD-10-CM | POA: Insufficient documentation

## 2022-09-18 DIAGNOSIS — K259 Gastric ulcer, unspecified as acute or chronic, without hemorrhage or perforation: Secondary | ICD-10-CM | POA: Insufficient documentation

## 2022-09-18 NOTE — Progress Notes (Signed)
09/18/2022 [name] [id number] 04/24/1933   HISTORY OF PRESENT ILLNESS: This is an 89 year old male who was previously a patient of Dr. Buel Ream.  He presents here for follow-up after recent ED visit on 08/13/2022.  He presented there with abdominal pain.  CT scan abdomen and pelvis with contrast:  IMPRESSION: 1. Cavitating ulcer arising from the posterior wall of the gastric antrum at the pylorus with mild surrounding inflammatory stranding. No extraluminal gas or loculated fluid collection identified. No evidence of obstruction. Correlation with endoscopy is recommended. 2. Moderate coronary artery calcification. 3. Minimal right nonobstructing nephrolithiasis. 4. Moderate distal colonic diverticulosis. 5. Surgical changes of probable right inguinal hernia repair. Increasing poorly circumscribed infiltrative soft tissue within this region may represent a local inflammatory process or progressive fibrosis. This is not well characterized on this examination and correlation with surgical history and clinical examination is recommended. 6. Interval increase in size of a 24 mm exophytic lesion arising laterally from the interpolar region of the left kidney. This may represent a hyperdense renal cyst or solid renal mass. If indicated, this would be better assessed with contrast enhanced CT or MRI examination once the patient's acute issues have resolved.   Aortic Atherosclerosis (ICD10-I70.0).  He is here today to discuss endoscopy.  He has been on Prilosec 40 mg daily.  Abdominal pain is significantly improved, not completely resolved, but much better.  No pain with eating.  No weight loss.  His right hand is actually swollen.  He said that in bed the other night all of a sudden it became red, swollen, and painful.  He was seen and evaluated for it yesterday and is awaiting results of testing and treatment plan for that.   Past Medical History:  Diagnosis  Date   Anxiety    Cataract    Depression    Diabetes mellitus    GERD (gastroesophageal reflux disease)    Hyperlipemia    Hypertension    Past Surgical History:  Procedure Laterality Date   CHOLECYSTECTOMY N/A 07/30/2019   Procedure: LAPAROSCOPIC CHOLECYSTECTOMY;  Surgeon: [name], Arta Bruce, MD;  Location: [facility];  Service: General;  Laterality: N/A;   [place]      reports that he has never smoked. He has never used smokeless tobacco. He reports that he does not drink alcohol and does not use drugs. family history includes Lung cancer in his brother. No Known Allergies    Outpatient Encounter Medications as of 09/18/2022  Medication Sig   amLODipine (NORVASC) 10 MG tablet Take 10 mg by mouth daily.    buPROPion (WELLBUTRIN XL) 300 MG 24 hr tablet Take 300 mg by mouth daily.   gabapentin (NEURONTIN) 800 MG tablet Take 800 mg by mouth 2 (two) times daily.   L-Methylfolate-Algae-B12-B6 (METANX PO) Take 1 capsule by mouth 2 (two) times daily.   LORazepam (ATIVAN) 0.5 MG tablet Take 0.5 mg by mouth 2 (two) times daily.    Multiple Vitamin (MULITIVITAMIN WITH MINERALS) TABS Take 1 tablet by mouth daily.   Multiple Vitamins-Minerals (PRESERVISION AREDS 2 PO) Take by mouth.   Omega-3 Fatty Acids (FISH OIL) 1000 MG CAPS Take by mouth.   omeprazole (PRILOSEC) 40 MG capsule Take 40 mg by mouth daily.   primidone (MYSOLINE) 50 MG tablet Take 50 mg by mouth 2 (two) times daily.   tamsulosin (FLOMAX) 0.4 MG CAPS capsule Take 0.8 mg by mouth daily after supper.   temazepam (RESTORIL) 30  MG capsule Take 30 mg by mouth at bedtime.    cephALEXin (KEFLEX) 500 MG capsule Take 1,000 mg by mouth 2 (two) times daily. (Patient not taking: Reported on 09/18/2022)   [DISCONTINUED] aspirin 81 MG chewable tablet Chew 81 mg by mouth daily as needed for moderate pain.    [DISCONTINUED] docusate sodium (COLACE) 100 MG capsule Take 1 capsule (100 mg total) by mouth daily. (Patient  not taking: No sig reported)   [DISCONTINUED] gabapentin (NEURONTIN) 400 MG capsule Take 400 mg by mouth 3 (three) times daily. (Patient not taking: Reported on 03/12/2021)   [DISCONTINUED] oxyCODONE (OXY IR/ROXICODONE) 5 MG immediate release tablet Take 1 tablet (5 mg total) by mouth every 4 (four) hours as needed for moderate pain. (Patient not taking: No sig reported)   [DISCONTINUED] polyethylene glycol (MIRALAX / GLYCOLAX) 17 g packet Take 17 g by mouth daily. (Patient not taking: No sig reported)   [DISCONTINUED] Vitamin D, Ergocalciferol, (DRISDOL) 1.25 MG (50000 UNIT) CAPS capsule Take 50,000 Units by mouth every 7 (seven) days.   No facility-administered encounter medications on file as of 09/18/2022.     REVIEW OF SYSTEMS  : All other systems reviewed and negative except where noted in the History of Present Illness.   PHYSICAL EXAM: BP (!) 146/70 (BP Location: Left Arm, Patient Position: Sitting, Cuff Size: Normal)   Pulse 72   Ht 5\' 5"  (1.651 m)   Wt 187 lb 4 oz (84.9 kg)   BMI 31.16 kg/m  General: Well developed male in no acute distress Head: Normocephalic and atraumatic Eyes:  Sclerae anicteric, conjunctiva pink. Ears: Normal auditory acuity Lungs: Clear throughout to auscultation; no W/R/R. Heart: Regular rate and rhythm; no M/R/G. Abdomen: Soft, non-distended.  BS present.  Non-tender. Musculoskeletal: Symmetrical with no gross deformities  Skin: No lesions on visible extremities Extremities: Right hand swollen, painful, erythematous. Neurological: Alert oriented x 4, grossly non-focal Psychological:  Alert and cooperative. Normal mood and affect  ASSESSMENT AND PLAN: *89 year old male who presented to the emergency department over a month ago with complaints of abdominal pain.  Found to have cavitating gastric ulcer on the posterior wall with stranding on CT scan.  He is on Prilosec once daily.  Abdominal pain significantly improved.  Will plan for EGD with Dr.  Candis Schatz in the near future.  The risks, benefits, and alternatives to EGD were discussed with the patient and he consents to proceed.   CC:  Simona Huh, NP

## 2022-09-18 NOTE — Patient Instructions (Signed)
You have been scheduled for an endoscopy. Please follow written instructions given to you at your visit today. If you use inhalers (even only as needed), please bring them with you on the day of your procedure.   _______________________________________________________  If your blood pressure at your visit was 140/90 or greater, please contact your primary care physician to follow up on this.  _______________________________________________________  If you are age 76 or older, your body mass index should be between 23-30. Your Body mass index is 31.16 kg/m. If this is out of the aforementioned range listed, please consider follow up with your Primary Care Provider.  If you are age 46 or younger, your body mass index should be between 19-25. Your Body mass index is 31.16 kg/m. If this is out of the aformentioned range listed, please consider follow up with your Primary Care Provider.   ________________________________________________________  The [facility] providers would like to encourage you to use [facility] to communicate with providers for non-urgent requests or questions.  Due to long hold times on the telephone, sending your provider a message by [facility] may be a faster and more efficient way to get a response.  Please allow 48 business hours for a response.  Please remember that this is for non-urgent requests.  _______________________________________________________   Due to recent changes in healthcare laws, you may see the results of your imaging and laboratory studies on MyChart before your provider has had a chance to review them.  We understand that in some cases there may be results that are confusing or concerning to you. Not all laboratory results come back in the same time frame and the provider may be waiting for multiple results in order to interpret others.  Please give [place] 48 hours in order for your provider to thoroughly review all the results before contacting the office for  clarification of your results.    I appreciate the  opportunity to care for you  Thank You   Janett Billow Zehr,[name]

## 2022-09-20 NOTE — Progress Notes (Signed)
Agree with the assessment and plan as outlined by Alonza Bogus, [name].  It appears that the patient's weight has been stable.  He has a stable mild anemia.  No reports of early satiety.  Unknown if patient was taking NSAIDs or if still taking NSAIDs.  Given symptom improvement and no weight loss, less concern for malignancy.  EGD as outlined.  Sakai Wolford E. Candis Schatz, MD  [facility]

## 2022-09-30 ENCOUNTER — Other Ambulatory Visit: Payer: Self-pay | Admitting: Gastroenterology

## 2022-09-30 ENCOUNTER — Ambulatory Visit (AMBULATORY_SURGERY_CENTER): Payer: Medicare HMO | Admitting: Gastroenterology

## 2022-09-30 ENCOUNTER — Encounter: Payer: Self-pay | Admitting: Gastroenterology

## 2022-09-30 VITALS — BP 138/76 | HR 63 | Temp 97.5°F | Resp 13 | Ht 65.0 in | Wt 187.0 lb

## 2022-09-30 DIAGNOSIS — K317 Polyp of stomach and duodenum: Secondary | ICD-10-CM | POA: Diagnosis not present

## 2022-09-30 DIAGNOSIS — K222 Esophageal obstruction: Secondary | ICD-10-CM | POA: Diagnosis not present

## 2022-09-30 DIAGNOSIS — K257 Chronic gastric ulcer without hemorrhage or perforation: Secondary | ICD-10-CM

## 2022-09-30 DIAGNOSIS — R933 Abnormal findings on diagnostic imaging of other parts of digestive tract: Secondary | ICD-10-CM

## 2022-09-30 MED ORDER — OMEPRAZOLE 40 MG PO CPDR: 40.0000 mg | DELAYED_RELEASE_CAPSULE | Freq: Two times a day (BID) | ORAL | 1 refills | Status: DC

## 2022-09-30 MED ORDER — SODIUM CHLORIDE 0.9 % IV SOLN: 500.0000 mL | Freq: Once | INTRAVENOUS | Status: DC

## 2022-09-30 NOTE — Progress Notes (Signed)
Called to room to assist during endoscopic procedure.  Patient ID and intended procedure confirmed with present staff. Received instructions for my participation in the procedure from the performing physician.  

## 2022-09-30 NOTE — Progress Notes (Signed)
PT reports that he isn;t sure if he ate crackers this morning at 0800. To be considered safe for anesthesia the patient will be done at 2:30 with Dr. Loletha Carrow this afternoon per Dr. Candis Schatz.

## 2022-09-30 NOTE — Progress Notes (Unsigned)
A/O x 3, gd SR's, VSS, report to RN 

## 2022-09-30 NOTE — Op Note (Signed)
[facility] Patient Name: [name] Procedure Date: 09/30/2022 2:18 PM MRN: [id number] Endoscopist: Miner. Loletha Carrow , MD, [id number] Age: 89 Referring MD:  Date of Birth: 08/19/1932 Gender: Male Account #: [id number] Procedure:                Upper GI endoscopy Indications:              Epigastric abdominal pain, Abnormal CT of the GI                            tract (gastric ulcer on CT scan during recent ED                            visit) Medicines:                Monitored Anesthesia Care Procedure:                Pre-Anesthesia Assessment:                           - Prior to the procedure, a History and Physical                            was performed, and patient medications and                            allergies were reviewed. The patient's tolerance of                            previous anesthesia was also reviewed. The risks                            and benefits of the procedure and the sedation                            options and risks were discussed with the patient.                            All questions were answered, and informed consent                            was obtained. Prior Anticoagulants: The patient has                            taken no anticoagulant or antiplatelet agents. ASA                            Grade Assessment: III - A patient with severe                            systemic disease. After reviewing the risks and                            benefits, the patient was deemed in satisfactory  condition to undergo the procedure.                           After obtaining informed consent, the endoscope was                            passed under direct vision. Throughout the                            procedure, the patient's blood pressure, pulse, and                            oxygen saturations were monitored continuously. The                            Olympus Scope [id number] was  introduced through the                            mouth, and advanced to the second part of duodenum.                            The upper GI endoscopy was accomplished without                            difficulty. The patient tolerated the procedure                            well. Scope In: Scope Out: Findings:                 One benign-appearing, intrinsic mild stenosis was                            found at the gastroesophageal junction. The                            stenosis was traversed.                           The exam of the esophagus was otherwise normal.                           One non-bleeding cratered gastric ulcer with                            irregular and heaped edges as well as pigmented                            material on the base was found on the anterior wall                            of the prepyloric region of the stomach. The lesion                            was 20-15mm mm in largest  dimension. Biopsies were                            taken with a cold forceps for histology.                           Multiple small sessile fundic gland polyps with no                            stigmata of recent bleeding were found in the                            gastric fundus and in the gastric body.                           Patchy mildly erythematous mucosa was found in the                            entire examined stomach. Several biopsies were                            obtained in the gastric body and in the gastric                            antrum with cold forceps for histology. (rule out H                            pylori)                           The examined duodenum was normal. Complications:            No immediate complications. Estimated Blood Loss:     Estimated blood loss was minimal. Impression:               - Benign-appearing esophageal stenosis.                           - Non-bleeding gastric ulcer with pigmented                             material. Biopsied.                           - Multiple fundic gland polyps.                           - Erythematous mucosa in the stomach.                           - Normal examined duodenum.                           - Several biopsies were obtained in the gastric  body and in the gastric antrum. Recommendation:           - Patient has a contact number available for                            emergencies. The signs and symptoms of potential                            delayed complications were discussed with the                            patient. Return to normal activities tomorrow.                            Written discharge instructions were provided to the                            patient.                           - Resume previous diet.                           - Continue present medications.                           - Await pathology results.                           - Avoid aspirin and all NSAIDs Carmisha Larusso L. Loletha Carrow, MD 09/30/2022 2:56:29 PM This report has been signed electronically.

## 2022-09-30 NOTE — Patient Instructions (Signed)
YOU HAD AN ENDOSCOPIC PROCEDURE TODAY AT [facility]:   Refer to the procedure report that was given to you for any specific questions about what was found during the examination.  If the procedure report does not answer your questions, please call your gastroenterologist to clarify.  If you requested that your care partner not be given the details of your procedure findings, then the procedure report has been included in a sealed envelope for you to review at your convenience later.  YOU SHOULD EXPECT: Some feelings of bloating in the abdomen. Passage of more gas than usual.  Walking can help get rid of the air that was put into your GI tract during the procedure and reduce the bloating. If you had a lower endoscopy (such as a colonoscopy or flexible sigmoidoscopy) you may notice spotting of blood in your stool or on the toilet paper. If you underwent a bowel prep for your procedure, you may not have a normal bowel movement for a few days.  Please Note:  You might notice some irritation and congestion in your nose or some drainage.  This is from the oxygen used during your procedure.  There is no need for concern and it should clear up in a day or so.  SYMPTOMS TO REPORT IMMEDIATELY:   Following upper endoscopy (EGD)  Vomiting of blood or coffee ground material  New chest pain or pain under the shoulder blades  Painful or persistently difficult swallowing  New shortness of breath  Fever of 100F or higher  Black, tarry-looking stools  For urgent or emergent issues, a gastroenterologist can be reached at any hour by calling [phone]. Do not use MyChart messaging for urgent concerns.    DIET:  We do recommend a small meal at first, but then you may proceed to your regular diet.  Drink plenty of fluids but you should avoid alcoholic beverages for 24 hours.  MEDICATIONS: Continue present medications.  FOLLOW UP: Await pathology results. Increase Prilosec (Omeprazole) to  40 mg by mouth twice daily. This prescription has been sent to your pharmacy of choice. Avoid all NSAIDs (non-steroidal anti-inflammatory drugs) such as Motrin (Ibuprofen), Naproxen, Alleve, Aspirin, etc. Tylenol is OK to take as this is not a NSAID.  Thank you for allowing [place] to provide for your healthcare needs today.  ACTIVITY:  You should plan to take it easy for the rest of today and you should NOT DRIVE or use heavy machinery until tomorrow (because of the sedation medicines used during the test).    FOLLOW UP: Our staff will call the number listed on your records the next business day following your procedure.  We will call around 7:15- 8:00 am to check on you and address any questions or concerns that you may have regarding the information given to you following your procedure. If we do not reach you, we will leave a message.     If any biopsies were taken you will be contacted by phone or by letter within the next 1-3 weeks.  Please call us at [phone] if you have not heard about the biopsies in 3 weeks.    SIGNATURES/CONFIDENTIALITY: You and/or your care partner have signed paperwork which will be entered into your electronic medical record.  These signatures attest to the fact that that the information above on your After Visit Summary has been reviewed and is understood.  Full responsibility of the confidentiality of this discharge information lies with you and/or your care-partner.

## 2022-09-30 NOTE — Progress Notes (Unsigned)
VS by DT  Pt's states no medical or surgical changes since previsit or office visit.  

## 2022-09-30 NOTE — Progress Notes (Unsigned)
No changes to clinical history since GI office visit on 09/18/22.  Patient was scheduled for EGD earlier today with Dr. Candis Schatz, but the procedure had to be delayed because the patient had consumed crackers. Consult note reviewed and recent ED CTAP report and images reviewed. Last (2013) EGD report Sharlett Iles) also reviewed.  The patient is appropriate for an endoscopic procedure in the ambulatory setting.  - Wilfrid Lund, MD

## 2022-10-01 ENCOUNTER — Telehealth: Payer: Self-pay

## 2022-10-01 NOTE — Telephone Encounter (Signed)
  Follow up Call-     09/30/2022   11:24 AM  Call back number  Post procedure Call Back phone  # [phone]  Permission to leave phone message Yes     Patient questions:  Do you have a fever, pain , or abdominal swelling? No. Pain Score  0 *  Have you tolerated food without any problems? Yes.    Have you been able to return to your normal activities? Yes.    Do you have any questions about your discharge instructions: Diet   No. Medications  No. Follow up visit  No.  Do you have questions or concerns about your Care? No.  Actions: * If pain score is 4 or above: No action needed, pain <4.

## 2022-10-02 ENCOUNTER — Telehealth: Payer: Self-pay | Admitting: Gastroenterology

## 2022-10-02 DIAGNOSIS — C169 Malignant neoplasm of stomach, unspecified: Secondary | ICD-10-CM

## 2022-10-02 NOTE — Telephone Encounter (Signed)
I received a call from pathology on this patient's gastric biopsies from earlier this week.  The gastric ulcer biopsies confirm that the ulcer is cancerous. Invasive moderately differentiated adenocarcinoma, and the report from Dr. Vic Ripper will come to the chart today.  I spoke to this man's son [name] earlier today with the diagnosis, and he conveyed that to his father since I was unable to reach this patient by phone on two attempts today.  Please arrange the following:   CBC, BUN and creatinine  CT scan chest with IV contrast for staging of gastric cancer (patient has already had a scan of the abdomen and pelvis about 6 weeks ago)  Referral to oncology  This patient's son [name] knows that this will be the plan.   - HD

## 2022-10-03 NOTE — Telephone Encounter (Signed)
Lab orders in epic.  CT chest ordered. Secure staff message sent to radiology scheduling to contact patient's son to set up appt ASAP.  Urgent referral to Oncology in Epic.

## 2022-10-03 NOTE — Telephone Encounter (Signed)
CT scan scheduled for 10/13/22 at 7 pm.

## 2022-10-09 DIAGNOSIS — C169 Malignant neoplasm of stomach, unspecified: Secondary | ICD-10-CM | POA: Insufficient documentation

## 2022-10-09 NOTE — Progress Notes (Unsigned)
[facility] [phone]   [phone]) [phone]  CONSULT NOTE  REFERRING PHYSICIAN: Dr. Myrtie Neitheranis  REASON FOR CONSULTATION:  Gastric Cancer   HPI [name] is a 89 y.o. male with a past medical history for hypertension, cholecystitis, diet controlled diabetes, depression, anxiety, and hyperlipidemia.   The patient saw his PCP in February for the chief complaint of abdominal pain.  The patient states that his abdominal pain was fairly acute.  He also was endorsing dark bowel movements.  His PCP subsequently sent him to the emergency room.  He was seen in the emergency room on 08/13/2022.  His lab work showed only mild anemia with a Hbg of 12.3. His CT scan of the abdomen pelvis showed fairly large gastric ulcer arising from the gastric antrum at the pylorus with mild surrounding inflammatory stranding. The scan also noted interval increase in a 24 mm exophytic lesion arising laterally from the interpolar region of the left kidney which is felt to represent renal cyst of solid mass. This was compared to a CT from 2021 which noted some renal cysts. These were not measured on his CT scan from 2021.   He was subsequently referred to GI for the CT findings.   The patient saw [facility] on 09/18/2022 by Doug SouJessica Zehr [name] and Dr. Tomasa Randunningham. His abdominal pain has significantly improved with Prilosec 40 mg daily.   He underwent EGD on 09/30/2022 which showed 1 bleeding cratered gastric ulcer with irregular and heaped edges as well as pigmented material on the base on the anterior wall of the prepyloric region of the stomach.  The lesion was 20 x 25 mm in the largest dimension.  There was also multiple small sessile fundic gland polyps in the gastric fundus and gastric body.  There is patchy mild erythematous mucosa found in the entire stomach.    He is scheduled for his staging CT scan of the chest on 10/13/2022.  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  His main complaint is related to some right wrist pain.  He has an old injury in this area.  He saw his PCP about 2 weeks ago because the area was swollen and bruised.  He states he was given a prescription for 4 to 7 days which he completed.  The bruising and swelling has improved but he continues to have some discomfort.  He is having a challenging time making appointment with his PCP due to difficulty getting through the phone lines.  Otherwise his abdominal pain has completely improved at this time.  He denies any fever, chills, night sweats, or ongoing weight loss.  He states that he actually has been gaining weight.  The patient is currently on a bland diet/soft diet secondary to needing dental work done and adjustment of dentures.  He denies any odynophagia or dysphagia.  Denies any significant history of reflux.  Denies any chest pain, unusual shortness of breath, cough, or hemoptysis.  Denies any nausea, vomiting, or diarrhea.  He sometimes may have constipation for which he takes what sounds like is MiraLAX.  He does still have dark stools intermittently.  He is unsure of the medical problems in his mother and father.  He had 4 siblings.  2 of which passed away secondary to respiratory concerns.  The patient denies any known family history of cancer.  The patient is widowed.  He used to  work as a Barrister's clerk.  He is independent at home and lives in [place].  He enjoys living in [place] and is involved socially with the activities there.  He does not use any assistive devices for ambulation.  He has 2 children.  His son lives in [place].  His daughter lives in [place].  He came to his appointment alone today.    HPI  Past Medical History:  Diagnosis Date   Anxiety    Cataract    Depression    Diabetes mellitus    GERD (gastroesophageal  reflux disease)    Hyperlipemia    Hypertension     Past Surgical History:  Procedure Laterality Date   CHOLECYSTECTOMY N/A 07/30/2019   Procedure: LAPAROSCOPIC CHOLECYSTECTOMY;  Surgeon: [name], [name], MD;  Location: [facility];  Service: General;  Laterality: N/A;   FRACTURE SURGERY     HERNIA REPAIR      Family History  Problem Relation Age of Onset   Lung cancer Brother    Colon cancer Neg Hx    Esophageal cancer Neg Hx    Rectal cancer Neg Hx    Stomach cancer Neg Hx     Social History Social History   Tobacco Use   Smoking status: Never   Smokeless tobacco: Never  Vaping Use   Vaping Use: Never used  Substance Use Topics   Alcohol use: No   Drug use: No    No Known Allergies  Current Outpatient Medications  Medication Sig Dispense Refill   amLODipine (NORVASC) 10 MG tablet Take 10 mg by mouth daily.      buPROPion (WELLBUTRIN XL) 300 MG 24 hr tablet Take 300 mg by mouth daily.  6   cephALEXin (KEFLEX) 500 MG capsule Take 1,000 mg by mouth 2 (two) times daily. (Patient not taking: Reported on 09/18/2022)     gabapentin (NEURONTIN) 800 MG tablet Take 800 mg by mouth 2 (two) times daily.     L-Methylfolate-Algae-B12-B6 (METANX PO) Take 1 capsule by mouth 2 (two) times daily.     LORazepam (ATIVAN) 0.5 MG tablet Take 0.5 mg by mouth 2 (two) times daily.      Multiple Vitamin (MULITIVITAMIN WITH MINERALS) TABS Take 1 tablet by mouth daily.     Omega-3 Fatty Acids (FISH OIL) 1000 MG CAPS Take by mouth.     omeprazole (PRILOSEC) 40 MG capsule Take 40 mg by mouth daily.     omeprazole (PRILOSEC) 40 MG capsule Take 1 capsule (40 mg total) by mouth in the morning and at bedtime. 60 capsule 1   primidone (MYSOLINE) 50 MG tablet Take 50 mg by mouth 2 (two) times daily.     tamsulosin (FLOMAX) 0.4 MG CAPS capsule Take 0.8 mg by mouth daily after supper.     temazepam (RESTORIL) 30 MG capsule Take 30 mg by mouth at bedtime.      Current Facility-Administered Medications   Medication Dose Route Frequency Provider Last Rate Last Admin   0.9 %  sodium chloride infusion  500 mL Intravenous Once Jenel Lucks, MD        REVIEW OF SYSTEMS:   Review of Systems  Constitutional: Negative for appetite change, chills, fatigue, fever and unexpected weight change.  HENT: Positive for requiring dental work and difficulty chewing (on soft food diet). Negative for mouth sores, nosebleeds, sore throat and trouble swallowing.   Eyes: Negative for eye problems and icterus.  Respiratory: Negative for cough, hemoptysis, shortness of breath and  wheezing.   Cardiovascular: Positive for intermittent leg swelling for which he wears compression stockings. Negative for chest pain.  Gastrointestinal: Negative for abdominal pain, constipation, diarrhea, nausea and vomiting.  Genitourinary: Negative for bladder incontinence, difficulty urinating, dysuria, frequency and hematuria.   Musculoskeletal: Positive for chronic low back pain. Positive for right wrist pain. Negative for gait problem, neck pain and neck stiffness.  Skin: Negative for itching and rash.  Neurological: Negative for dizziness, extremity weakness, gait problem, headaches, light-headedness and seizures.  Hematological: Negative for adenopathy. Does not bruise/bleed easily.  Psychiatric/Behavioral: Negative for confusion, depression and sleep disturbance. The patient is not nervous/anxious.     PHYSICAL EXAMINATION:  There were no vitals taken for this visit.  ECOG PERFORMANCE STATUS: 1  Physical Exam  Constitutional: Oriented to person, place, and time and well-developed, well-nourished, and in no distress.  HENT:  Head: Normocephalic and atraumatic.  Mouth/Throat: Oropharynx is clear and moist. No oropharyngeal exudate.  Eyes: Conjunctivae are normal. Right eye exhibits no discharge. Left eye exhibits no discharge. No scleral icterus.  Neck: Normal range of motion. Neck supple.  Cardiovascular: Normal  rate, regular rhythm, normal heart sounds and intact distal pulses.   Pulmonary/Chest: Effort normal and breath sounds normal. No respiratory distress. No wheezes. No rales.  Abdominal: Soft. Bowel sounds are normal. Exhibits no distension and no mass. There is no tenderness.  Musculoskeletal: Normal range of motion. Exhibits no edema.  Lymphadenopathy:    No cervical adenopathy.  Neurological: Alert and oriented to person, place, and time. Exhibits normal muscle tone. Gait normal. Coordination normal.  Skin: Skin is warm and dry. No rash noted. Not diaphoretic. No erythema. No pallor.  Psychiatric: Mood, memory and judgment normal.  Vitals reviewed.  LABORATORY DATA: Lab Results  Component Value Date   WBC 6.9 08/13/2022   HGB 12.3 (L) 08/13/2022   HCT 38.0 (L) 08/13/2022   MCV 88.0 08/13/2022   PLT 357 08/13/2022      Chemistry      Component Value Date/Time   NA 134 (L) 08/13/2022 1653   K 3.2 (L) 08/13/2022 1653   CL 93 (L) 08/13/2022 1653   CO2 30 08/13/2022 1653   BUN 17 08/13/2022 1653   CREATININE 1.08 08/13/2022 1653   CREATININE 1.22 09/02/2012 1309      Component Value Date/Time   CALCIUM 9.6 08/13/2022 1653   ALKPHOS 101 08/13/2022 1653   AST 20 08/13/2022 1653   ALT 17 08/13/2022 1653   BILITOT 0.1 (L) 08/13/2022 1653       RADIOGRAPHIC STUDIES: No results found.  ASSESSMENT: This is a very pleasant 89 year old male with:   Gastric Adenocarcinoma, Diagnosed in April 2024 -He presented with abdominal pain in February 2024. CT in the ER noted cavitation ulcer arising from the posterior gastric antrum at the pylorus.  The scan also incidentally mentioned a enlarging 24 mm exophytic lesion arising laterally from the interpolar region of the left kidney that has enlarged since 2021 which may represent cyst or solid mass. Dr. Mosetta Putt feels this is separate and unrelated to the gastric cancer.  -He established outpatient with [facility] on 09/18/22. He saw Dr.  Tomasa Rand -He underwent EGD on 09/30/22 by Dr. Myrtie Neither. This demonstrated non-bleeding cratered gastric ulcer with irregular and heaped edges, measuring 20x25 mm in the largest dimension.  -The final pathology ([id number]) showed invasive moderately differentiated adenocarcinoma -He is scheduled for restaging CT scan of the chest on 10/13/2022 -The patient was seen with  Dr. Mosetta Putt today.  Dr. Mosetta Putt had a lengthy discussion with the patient today about his current condition and treatment options. -If the patient were interested in pursuing further workup, Dr. Mosetta Putt would recommend discussing the patient's case at multidisciplinary conference next week, referral to surgeon to see if a candidate for resection, and EUS for staging if he is being considered for surgery.  We would also arrange for PD-L1, HER2, and MMR testing. -Dr. Mosetta Putt discussed that while we have not completed the staging workup, thus far, this appears to be an early stage.  The only curative option is surgery. -If the patient is not interested in surgery or is not a surgical candidate, then other options can be radiation or chemo for disease control, but would not be curative. -Life expectancy without treatment is 6 months to 1 year. -The patient states that he has had a good life.  He is leaning towards not pursuing any treatment. His is about to turn 38 in June. -Dr. Mosetta Putt discussed signs and symptoms of progressive disease including abdominal pain, bleeding, abdominal perforation, fatigue, appetite change, and weight loss.  The patient verbalized understanding. -The patient would be open to having blood work performed today including CBC, CMP, iron studies, and CEA -He would also be open to [place] regarding MMR, HER2, and PD-L1. -He is going to complete his staging workup with a CT scan on Monday as scheduled.  -He is going to talk to his family and we will see the patient back for follow-up visit in 2 weeks to review the results of his molecular  studies, lab work, and CT scan. -If the patient elects to not pursue any treatment, we recommend his PCP follow him with routine blood work as he will develop progressive symptoms and likely anemia and eventually refer to palliative care and hospice -I offered to call the patient's son today and discuss the options.  I also provided the patient with a summary of the options that were discussed at his appointment today.  He is going to share this with his family next week.  I offered to call the patient's son today and summarize the appointment.  The patient declined and states he would like to talk to him himself next week on 10/13/2022.  Renal Lesion -CT scan of the abdomen pelvis incidentally noted a 24 mm  -exophytic lesion arising laterally from the interpolar region of the left kidney that has enlarged since 2021 which may represent cyst or solid mass. Dr. Mosetta Putt feels this is separate and unrelated to the gastric cancer.  -We will avoid any further workup at this time as the patient is leaning towards not pursuing any treatment  Social -The patient lives in independent living at [facility] -He has a son who lives in [place] and a daughter who lives in [place]  Comorbidities -The patient mentions he saw his PCP approximately 2 weeks ago for wrist pain and was given medication for this.  The bruising and swelling has improved but he continues to have some discomfort in this area. -He is having a hard time reaching his PCPs office to make an appointment. -For now,  advised to use ice pack and Tylenol.  Also encouraged to pick up a wrist brace.  We will call the PCPs office today to help facilitate making him a follow-up.  It appears his PCP has changed locations and we have provided him with a new address and phone number.  Plan:  -Request MMR, HER2, and PDL1 -  CBC, CEA, iron, ferritin, and CMP drawn today  -Follow up in 2 weeks to review results of CT and molecular studies.  The  patient voices understanding of current disease status and treatment options and is in agreement with the current care plan.  All questions were answered. The patient knows to call the clinic with any problems, questions or concerns. We can certainly see the patient much sooner if necessary.  Thank you so much for allowing me to participate in the care of Embry Huss. I will continue to follow up the patient with you and assist in his care.   Disclaimer: This note was dictated with voice recognition software. Similar sounding words can inadvertently be transcribed and may not be corrected upon review.   Dezman Granda L Katheen Aslin October 09, 2022, 10:03 AM

## 2022-10-10 ENCOUNTER — Inpatient Hospital Stay: Payer: Medicare HMO

## 2022-10-10 ENCOUNTER — Other Ambulatory Visit: Payer: Self-pay

## 2022-10-10 ENCOUNTER — Inpatient Hospital Stay: Payer: Medicare HMO | Attending: Physician Assistant | Admitting: Physician Assistant

## 2022-10-10 VITALS — BP 138/48 | HR 62 | Temp 97.5°F | Resp 18 | Wt 186.0 lb

## 2022-10-10 DIAGNOSIS — M25531 Pain in right wrist: Secondary | ICD-10-CM | POA: Diagnosis not present

## 2022-10-10 DIAGNOSIS — R635 Abnormal weight gain: Secondary | ICD-10-CM | POA: Insufficient documentation

## 2022-10-10 DIAGNOSIS — C169 Malignant neoplasm of stomach, unspecified: Secondary | ICD-10-CM | POA: Insufficient documentation

## 2022-10-10 DIAGNOSIS — I700 Atherosclerosis of aorta: Secondary | ICD-10-CM | POA: Insufficient documentation

## 2022-10-10 DIAGNOSIS — D649 Anemia, unspecified: Secondary | ICD-10-CM | POA: Insufficient documentation

## 2022-10-10 DIAGNOSIS — E785 Hyperlipidemia, unspecified: Secondary | ICD-10-CM | POA: Diagnosis not present

## 2022-10-10 DIAGNOSIS — K254 Chronic or unspecified gastric ulcer with hemorrhage: Secondary | ICD-10-CM | POA: Diagnosis not present

## 2022-10-10 DIAGNOSIS — K219 Gastro-esophageal reflux disease without esophagitis: Secondary | ICD-10-CM | POA: Diagnosis not present

## 2022-10-10 DIAGNOSIS — I2510 Atherosclerotic heart disease of native coronary artery without angina pectoris: Secondary | ICD-10-CM | POA: Insufficient documentation

## 2022-10-10 DIAGNOSIS — Z79899 Other long term (current) drug therapy: Secondary | ICD-10-CM | POA: Diagnosis not present

## 2022-10-10 DIAGNOSIS — N281 Cyst of kidney, acquired: Secondary | ICD-10-CM | POA: Diagnosis not present

## 2022-10-10 DIAGNOSIS — E1136 Type 2 diabetes mellitus with diabetic cataract: Secondary | ICD-10-CM | POA: Diagnosis not present

## 2022-10-10 DIAGNOSIS — I10 Essential (primary) hypertension: Secondary | ICD-10-CM | POA: Diagnosis not present

## 2022-10-10 DIAGNOSIS — F419 Anxiety disorder, unspecified: Secondary | ICD-10-CM | POA: Diagnosis not present

## 2022-10-10 DIAGNOSIS — C163 Malignant neoplasm of pyloric antrum: Secondary | ICD-10-CM | POA: Diagnosis not present

## 2022-10-10 DIAGNOSIS — Z801 Family history of malignant neoplasm of trachea, bronchus and lung: Secondary | ICD-10-CM | POA: Diagnosis not present

## 2022-10-10 LAB — CBC WITH DIFFERENTIAL (CANCER CENTER ONLY)
Abs Immature Granulocytes: 0.03 10*3/uL (ref 0.00–0.07)
Basophils Absolute: 0 10*3/uL (ref 0.0–0.1)
Basophils Relative: 0 %
Eosinophils Absolute: 0.2 10*3/uL (ref 0.0–0.5)
Eosinophils Relative: 4 %
HCT: 33.6 % — ABNORMAL LOW (ref 39.0–52.0)
Hemoglobin: 11 g/dL — ABNORMAL LOW (ref 13.0–17.0)
Immature Granulocytes: 1 %
Lymphocytes Relative: 21 %
Lymphs Abs: 1.2 10*3/uL (ref 0.7–4.0)
MCH: 28.7 pg (ref 26.0–34.0)
MCHC: 32.7 g/dL (ref 30.0–36.0)
MCV: 87.7 fL (ref 80.0–100.0)
Monocytes Absolute: 0.6 10*3/uL (ref 0.1–1.0)
Monocytes Relative: 10 %
Neutro Abs: 3.6 10*3/uL (ref 1.7–7.7)
Neutrophils Relative %: 64 %
Platelet Count: 319 10*3/uL (ref 150–400)
RBC: 3.83 MIL/uL — ABNORMAL LOW (ref 4.22–5.81)
RDW: 15.9 % — ABNORMAL HIGH (ref 11.5–15.5)
WBC Count: 5.6 10*3/uL (ref 4.0–10.5)
nRBC: 0 % (ref 0.0–0.2)

## 2022-10-10 LAB — IRON AND IRON BINDING CAPACITY (CC-WL,HP ONLY)
Iron: 106 ug/dL (ref 45–182)
Saturation Ratios: 34 % (ref 17.9–39.5)
TIBC: 312 ug/dL (ref 250–450)
UIBC: 206 ug/dL (ref 117–376)

## 2022-10-10 LAB — CMP (CANCER CENTER ONLY)
ALT: 18 U/L (ref 0–44)
AST: 18 U/L (ref 15–41)
Albumin: 3.7 g/dL (ref 3.5–5.0)
Alkaline Phosphatase: 87 U/L (ref 38–126)
Anion gap: 5 (ref 5–15)
BUN: 19 mg/dL (ref 8–23)
CO2: 34 mmol/L — ABNORMAL HIGH (ref 22–32)
Calcium: 9.2 mg/dL (ref 8.9–10.3)
Chloride: 100 mmol/L (ref 98–111)
Creatinine: 1.13 mg/dL (ref 0.61–1.24)
GFR, Estimated: >60 mL/min (ref >60)
Glucose, Bld: 112 mg/dL — ABNORMAL HIGH (ref 70–99)
Potassium: 3.6 mmol/L (ref 3.5–5.1)
Sodium: 139 mmol/L (ref 135–145)
Total Bilirubin: 0.3 mg/dL (ref 0.3–1.2)
Total Protein: 6.4 g/dL — ABNORMAL LOW (ref 6.5–8.1)

## 2022-10-10 LAB — CEA (ACCESS): CEA (CHCC): 6.1 ng/mL — ABNORMAL HIGH (ref 0.00–5.00)

## 2022-10-10 LAB — FERRITIN: Ferritin: 15 ng/mL — ABNORMAL LOW (ref 24–336)

## 2022-10-10 NOTE — Patient Instructions (Addendum)
Summary: -It was very nice meeting you today.  I know we covered a lot of important information.  Here is the main take away points.  -They took a sample of the ulcer in your stomach.  A pathologist looked at this under microscope and ran some test.  It turns out that the ulcer is cancerous.  Therefore, this is cancer of the stomach which we also called gastric cancer.  I have attached information about gastric cancer below. -The question is what do we do about this? -In order to figure out how to best treat you, we have to complete the "staging workup".  The stage is range from stage I through stage IV.  Stage IV means that the cancer has spread.  As of right now, we are not sure what the stage is, since you have not had the CT of the chest yet (this is on Monday).  Based on the imaging studies that we have right now, it looks like this could be early stage. -The only curative option is surgery.  Surgery would involve removing part of the stomach and lymph nodes nearby. We are unsure if you are a surgical candidate but if you are interested in seeing a surgeon then we be happy to refer you so they can assess you for candidacy.  Although it sounds like you are not interested in seeing a surgeon at this time, so we will not place the referral at this time. -If you are not interested in surgery or if a surgeon does not feel that you are a candidate for surgery, other options to help obtain disease control would be radiation and/or chemotherapy.  Chemotherapy and radiation would not be curative but it could prolong your life.  -Without treatment, gastric cancer is fairly aggressive and a life expectancy is 6 months to 1 year. -If you are interested in radiation, the radiation doctors work in the basement of this building.  We would be happy to refer you if you decide you are interested in this. Radiation is probably the easiest option on you for disease control.  -We are also going to run some extra test to  see if you are a candidate for any other treatment options such as chemo, or what we call targeted treatment which sometimes is immunotherapy which is generally pretty well-tolerated.  Immunotherapy is given an IV every few weeks.  -If you decide that you are not interested in any treatment after talking to your family, then we would recommend that your family doctor refer you to palliative care for now and eventually hospice. -Symptoms of worsening cancer can include abdominal pain, bleeding, and in rare situations abdominal/stomach perforation. You may also lose weight and have fatigue and decreased appetite. -We will see you back for follow-up visit in 2 weeks after we have the results of the special molecular test that we are ordering today and after your CT scan and we can rediscuss options once we know what you are candidate for.  Of course, if you decide, it is always an option to not do any treatment.  We just want to make sure that you know all of your options before making an informed decision.    Thank you for choosing [facility] to provide your care.   Should you have questions after your visit to the [facility]), please contact this office at [phone] between 8:30 AM and 4:30 PM.  Voice mails left after 4:00 PM may  not be returned until the following business day.  Calls received after 4:30 PM will be answered by an off-site Nurse Triage Line.    Prescription Refills:  Please have your pharmacy contact us directly for most prescription requests.  Contact the office directly for refills of narcotics (pain medications). Allow 48-72 hours for refills.  Appointments: Please contact the [facility] scheduling department [phone] for questions regarding [facility] appointment scheduling.  Contact the schedulers with any scheduling changes so that your appointment can be rescheduled in a timely manner.   Central Scheduling for [facility] [phone] - Call to  schedule PET scan, CT scan, MRI, and Ultrasound.  To afford each patient quality time with our providers, please arrive 30 minutes before your scheduled appointment time.  If you arrive late for your appointment, you may be asked to reschedule.  We strive to give you quality time with our providers, and arriving late affects you and other patients whose appointments are after yours. If you are a no show for multiple scheduled visits, you may be dismissed from the clinic at the providers discretion.     Resources: [facility] Social Workers 4311883937 for additional information on assistance programs or assistance connecting with community support programs   [place] DSS  7657827235: Information regarding food stamps, Medicaid, and utility assistance [employer] [phone] [phone]   [place] [employer]'s shared-ride transportation service for eligible riders who have a disability that prevents them from riding the fixed route bus.   Medicare [employer] [facility] (603)131-5182 Helps people with Medicare understand their rights and benefits, navigate the Medicare system, and secure the quality healthcare they deserve [employer] 510-176-1678 Assists patients locate various types of support and financial assistance Cancer Care: 1-800-813-HOPE (872)071-6867) Provides financial assistance, online support groups, medication/co-pay assistance.   Transportation Assistance for appointments at [employer] inform scheduler or provider support staff for referral to Transportation Assistance   Again, thank you for choosing [facility] for your care.

## 2022-10-10 NOTE — Progress Notes (Signed)
I met with Mr [name] after his consultation with [facility] [name] Dr Mosetta Putt.  I explained my role as a nurse navigator and provided my contact information.  At this time he states he does not want any treatment for his gastric cancer.  I have requested PD-L1, HER2, and MMR testing on he biopsy specimen per dr Mosetta Putt.

## 2022-10-13 ENCOUNTER — Ambulatory Visit (HOSPITAL_BASED_OUTPATIENT_CLINIC_OR_DEPARTMENT_OTHER)
Admission: RE | Admit: 2022-10-13 | Discharge: 2022-10-13 | Disposition: A | Discharge status: Home / Self Care | Payer: Medicare HMO | Source: Ambulatory Visit | Attending: Gastroenterology | Admitting: Gastroenterology

## 2022-10-13 ENCOUNTER — Telehealth: Payer: Self-pay | Admitting: Physician Assistant

## 2022-10-13 DIAGNOSIS — C169 Malignant neoplasm of stomach, unspecified: Secondary | ICD-10-CM | POA: Insufficient documentation

## 2022-10-13 MED ORDER — IOHEXOL 300 MG/ML  SOLN
100.0000 mL | Freq: Once | INTRAMUSCULAR | Status: AC | PRN
  Administered 2022-10-13: 75 mL via INTRAVENOUS

## 2022-10-13 NOTE — Telephone Encounter (Signed)
I called the patient's cell phone and left a detailed voicemail.  I was calling to recommend that the patient take an iron supplement over-the-counter 1 tablet by mouth daily.  His ferritin was low at 15 from his lab work performed last week.  I left our callback number if he has any questions regarding this message.  I also mentioned that if he would prefer that I sent a prescription iron to the pharmacy, that I be happy to do that as well if he can call us back to confirm the pharmacy.  I will also try to call back again this week if I do not hear back from him.

## 2022-10-16 ENCOUNTER — Telehealth: Payer: Self-pay | Admitting: Internal Medicine

## 2022-10-16 NOTE — Telephone Encounter (Signed)
Scheduled per April/May appointments, left a voicemail.

## 2022-10-22 ENCOUNTER — Other Ambulatory Visit: Payer: Self-pay | Admitting: Medical Oncology

## 2022-10-22 DIAGNOSIS — C163 Malignant neoplasm of pyloric antrum: Secondary | ICD-10-CM

## 2022-10-22 NOTE — Progress Notes (Signed)
[facility] OFFICE PROGRESS NOTE  [name], NP [address] [place] [address]  DIAGNOSIS: Gastric Cancer  Oncology History  Gastric cancer (HCC)  08/13/2022 Imaging   CT ABDOMEN PELVIS W CONTRAST   IMPRESSION: 1. Cavitating ulcer arising from the posterior wall of the gastric antrum at the pylorus with mild surrounding inflammatory stranding. No extraluminal gas or loculated fluid collection identified. No evidence of obstruction. Correlation with endoscopy is recommended. 2. Moderate coronary artery calcification. 3. Minimal right nonobstructing nephrolithiasis. 4. Moderate distal colonic diverticulosis. 5. Surgical changes of probable right inguinal hernia repair. Increasing poorly circumscribed infiltrative soft tissue within this region may represent a local inflammatory process or progressive fibrosis. This is not well characterized on this examination and correlation with surgical history and clinical examination is recommended. 6. Interval increase in size of a 24 mm exophytic lesion arising laterally from the interpolar region of the left kidney. This may represent a hyperdense renal cyst or solid renal mass. If indicated, this would be better assessed with contrast enhanced CT or MRI examination once the patient's acute issues have resolved.   09/30/2022 Procedure   Upper Endoscopy   Findings: -1 benign-appearing intrinsic mild stenosis was found at the gastroesophageal junction the stenosis was transversed. -The exam of the esophagus was otherwise normal. -1 nonbleeding cratered gastric ulcer with irregular and heaped edges as well as pigmented material on the base was found on the anterior wall of the prepyloric region of the stomach.  The lesion was 20 to 25 mm in largest dimension.  Biopsies were taken with a cold forcep for histology. -Multiple small sessile fundic gland polyps with no stigmata of recent bleeding were found in the gastric  fundus and in the gastric body. -Patchy mild erythematous mucosa was found in the entire examined stomach.  Several biopsies were obtained in the gastric body and in the gastric antrum with cold forceps for histology to rule out H. Pylori -The examined duodenum was otherwise normal   09/30/2022 Pathology Results   Accession: [id number]  FINAL DIAGNOSIS Diagnosis 1. Stomach, biopsy, ulcer - INVASIVE MODERATELY DIFFERENTIATED ADENOCARCINOMA, SEE COMMENT 2. Stomach, biopsy, antrum and body - GASTRIC ANTRAL MUCOSA WITH NONSPECIFIC REACTIVE GASTROPATHY - GASTRIC OXYNTIC MUCOSA WITH PARIETAL CELL HYPERPLASIA AS CAN BE SEEN IN HYPERGASTRINEMIC STATES SUCH AS PPI THERAPY. - HELICOBACTER PYLORI-LIKE ORGANISMS ARE NOT IDENTIFIED ON ROUTINE H&E STAIN   10/09/2022 Initial Diagnosis   Gastric cancer   10/13/2022 Imaging   CT CHEST W CONTRAST   IMPRESSION: 1. No evidence of metastatic disease in the chest. 2. Persistent gastric wall thickening in the visualized posterior antral pyloric region, compatible with known gastric malignancy. 3. Three-vessel coronary atherosclerosis. 4.  Aortic Atherosclerosis (ICD10-I70.0).     CURRENT THERAPY: Observation    INTERVAL HISTORY: Arnet Hofferber 89 y.o. male returns to the clinic today for a follow-up visit. The patient was recently diagnosed with gastric cancer after presenting to the emergency room in February for abdominal pain and CT imaging showed gastric ulcer.  He then followed up outpatient with GI who performed EGD which showed 1 bleeding cratered gastric ulcer and the final pathology showed gastric cancer.  We establish care with the patient outpatient and medical oncology on 10/10/2022.  At that time, we discussed the only curative option is surgery.  The patient was not interested in seeing a surgeon.  We discussed other options such as systemic treatment for disease control will be radiation or systemic treatment.  The  patient was  leaning towards not pursuing any treatment but was agreeable to having HER2, MMR, PD-L1 testing performed to assess other options.  He also was agreeable to completing the staging workup with a CT scan.  He also was mildly anemic and found to be iron deficient.  I called the patient but was unable to reach him about taking an iron supplement. He received my voicemail and would like for me to send in the iron tablet prescription to his pharmacy.   The patient talked to his 2 children about his condition.  His daughter would like him to consider surgery.  The patient expressed today several times that he "has lived a long and good life" and that he does not want to pursue any treatment with what time he has left.  He understands this may be a few months to a little over a year.  Since last being seen, the patient denies any changes in his health.  He has no complaints today.  The wrist pain that he had at his last appointment has completely resolved.  The abdominal pain has completely resolved.  Denies any fever, chills, night sweats, or weight loss.  He denies any odynophagia or dysphagia.  He does have a soft diet secondary to his dentures.  Denies any reflux.  Denies any chest pain, unusual shortness of breath, cough, or hemoptysis.  Denies any nausea, vomiting, or diarrhea.  He may have constipation intermittently and takes MiraLAX.  He is here today for reevaluation and more detailed discussion about his current condition and next steps moving forward.    MEDICAL HISTORY: Past Medical History:  Diagnosis Date   Anxiety    Cataract    Depression    Diabetes mellitus    GERD (gastroesophageal reflux disease)    Hyperlipemia    Hypertension     ALLERGIES:  has No Known Allergies.  MEDICATIONS:  Current Outpatient Medications  Medication Sig Dispense Refill   ferrous sulfate 325 (65 FE) MG EC tablet Take 1 tablet (325 mg total) by mouth daily with breakfast. 30 tablet 3   potassium chloride  SA (KLOR-CON M) 20 MEQ tablet Take 1 tablet (20 mEq total) by mouth daily. 3 tablet 0   amLODipine (NORVASC) 10 MG tablet Take 10 mg by mouth daily.      buPROPion (WELLBUTRIN XL) 300 MG 24 hr tablet Take 300 mg by mouth daily.  6   gabapentin (NEURONTIN) 800 MG tablet Take 800 mg by mouth 2 (two) times daily.     L-Methylfolate-Algae-B12-B6 (METANX PO) Take 1 capsule by mouth 2 (two) times daily.     LORazepam (ATIVAN) 0.5 MG tablet Take 0.5 mg by mouth 2 (two) times daily.      Multiple Vitamin (MULITIVITAMIN WITH MINERALS) TABS Take 1 tablet by mouth daily.     Omega-3 Fatty Acids (FISH OIL) 1000 MG CAPS Take by mouth.     omeprazole (PRILOSEC) 40 MG capsule Take 1 capsule (40 mg total) by mouth in the morning and at bedtime. 60 capsule 1   primidone (MYSOLINE) 50 MG tablet Take 50 mg by mouth 2 (two) times daily.     tamsulosin (FLOMAX) 0.4 MG CAPS capsule Take 0.8 mg by mouth daily after supper.     Current Facility-Administered Medications  Medication Dose Route Frequency Provider Last Rate Last Admin   0.9 %  sodium chloride infusion  500 mL Intravenous Once Jenel Lucks, MD        SURGICAL  HISTORY:  Past Surgical History:  Procedure Laterality Date   CHOLECYSTECTOMY N/A 07/30/2019   Procedure: LAPAROSCOPIC CHOLECYSTECTOMY;  Surgeon: [name], [name], MD;  Location: [facility];  Service: General;  Laterality: N/A;   FRACTURE SURGERY     HERNIA REPAIR      REVIEW OF SYSTEMS:   Review of Systems  Constitutional: Negative for appetite change, chills, fatigue, fever and unexpected weight change.  HENT: Positive for requiring dental work and difficulty chewing (on soft food diet). Negative for mouth sores, nosebleeds, sore throat and trouble swallowing.   Eyes: Negative for eye problems and icterus.  Respiratory: Negative for cough, hemoptysis, shortness of breath and wheezing.   Cardiovascular: Negative for chest pain and leg swelling.  Gastrointestinal: Negative for  abdominal pain, constipation, diarrhea, nausea and vomiting.  Genitourinary: Negative for bladder incontinence, difficulty urinating, dysuria, frequency and hematuria.   Musculoskeletal: Positive for chronic low back pain. Positive for right wrist pain. Negative for gait problem, neck pain and neck stiffness.  Skin: Negative for itching and rash. Skin: Negative for itching and rash.  Neurological: Negative for dizziness, extremity weakness, gait problem, headaches, light-headedness and seizures.  Hematological: Negative for adenopathy. Does not bruise/bleed easily.  Psychiatric/Behavioral: Negative for confusion, depression and sleep disturbance. The patient is not nervous/anxious.     PHYSICAL EXAMINATION:  There were no vitals taken for this visit.  ECOG PERFORMANCE STATUS: 1  Physical Exam  Constitutional: Oriented to person, place, and time and well-developed, well-nourished, and in no distress.  HENT:  Head: Normocephalic and atraumatic.  Mouth/Throat: Oropharynx is clear and moist. No oropharyngeal exudate.  Eyes: Conjunctivae are normal. Right eye exhibits no discharge. Left eye exhibits no discharge. No scleral icterus.  Neck: Normal range of motion. Neck supple.  Cardiovascular: Normal rate, regular rhythm, normal heart sounds and intact distal pulses.   Pulmonary/Chest: Effort normal and breath sounds normal. No respiratory distress. No wheezes. No rales.  Abdominal: Soft. Bowel sounds are normal. Exhibits no distension and no mass. There is no tenderness.  Musculoskeletal: Normal range of motion. Exhibits no edema.  Lymphadenopathy:    No cervical adenopathy.  Neurological: Alert and oriented to person, place, and time. Exhibits normal muscle tone. Gait normal. Coordination normal.  Skin: Skin is warm and dry. No rash noted. Not diaphoretic. No erythema. No pallor.  Psychiatric: Mood, memory and judgment normal.  Vitals reviewed.  LABORATORY DATA: Lab Results  Component  Value Date   WBC 5.6 10/24/2022   HGB 10.5 (L) 10/24/2022   HCT 33.4 (L) 10/24/2022   MCV 90.8 10/24/2022   PLT 326 10/24/2022      Chemistry      Component Value Date/Time   NA 140 10/24/2022 0843   K 3.3 (L) 10/24/2022 0843   CL 99 10/24/2022 0843   CO2 36 (H) 10/24/2022 0843   BUN 19 10/24/2022 0843   CREATININE 1.20 10/24/2022 0843   CREATININE 1.22 09/02/2012 1309      Component Value Date/Time   CALCIUM 8.9 10/24/2022 0843   ALKPHOS 88 10/24/2022 0843   AST 18 10/24/2022 0843   ALT 16 10/24/2022 0843   BILITOT 0.2 (L) 10/24/2022 0843       RADIOGRAPHIC STUDIES:  CT CHEST W CONTRAST  Result Date: 10/16/2022 CLINICAL DATA:  Gastric cancer. Chest staging. * Tracking Code: BO * EXAM: CT CHEST WITH CONTRAST TECHNIQUE: Multidetector CT imaging of the chest was performed during intravenous contrast administration. RADIATION DOSE REDUCTION: This exam was performed according to the  departmental dose-optimization program which includes automated exposure control, adjustment of the mA and/or kV according to patient size and/or use of iterative reconstruction technique. CONTRAST:  75mL OMNIPAQUE IOHEXOL 300 MG/ML  SOLN COMPARISON:  08/13/2022 CT abdomen/pelvis. 11/17/2016 chest radiograph. FINDINGS: Cardiovascular: Normal heart size. No significant pericardial effusion/thickening. Three-vessel coronary atherosclerosis. Atherosclerotic nonaneurysmal thoracic aorta. Normal caliber pulmonary arteries. No central pulmonary emboli. Mediastinum/Nodes: No significant thyroid nodules. Unremarkable esophagus. No pathologically enlarged axillary, mediastinal or hilar lymph nodes. Lungs/Pleura: No pneumothorax. No pleural effusion. No acute consolidative airspace disease, lung masses or significant pulmonary nodules. Small curvilinear parenchymal bands scattered in the mid to lower lungs bilaterally, unchanged and compatible with mild postinfectious/postinflammatory bland scarring. Upper abdomen:  Simple 1.5 cm upper right renal cyst, requiring no further imaging follow-up. Persistent gastric wall thickening in the visualized posterior antral pyloric region. Musculoskeletal: No aggressive appearing focal osseous lesions. Moderate thoracic spondylosis. IMPRESSION: 1. No evidence of metastatic disease in the chest. 2. Persistent gastric wall thickening in the visualized posterior antral pyloric region, compatible with known gastric malignancy. 3. Three-vessel coronary atherosclerosis. 4.  Aortic Atherosclerosis (ICD10-I70.0). Electronically Signed   By: Delbert Phenix M.D.   On: 10/16/2022 10:59     ASSESSMENT/PLAN:  This is a very pleasant 89 year old male with:    Gastric Adenocarcinoma, Diagnosed in April 2024 -He presented with abdominal pain in February 2024. CT in the ER noted cavitation ulcer arising from the posterior gastric antrum at the pylorus.   -He underwent EGD on 09/30/22 by Dr. Myrtie Neither. This demonstrated non-bleeding cratered gastric ulcer with irregular and heaped edges, measuring 20x25 mm in the largest dimension.  -The final pathology ([id number]) showed invasive moderately differentiated adenocarcinoma -He is scheduled for restaging CT scan of the chest on 10/13/2022 -He established care with medical oncology on 10/10/22.  Discussed only curative option is surgery at that appointment.  We also discussed other disease control options such as radiation or systemic treatment.  The patient is leaning towards not pursuing any treatment.  But was open to having PD-L1, MMR, and HER2 testing performed.  He also was open to completing the staging workup with a CT scan of the chest.   -The patient's CT scan did not show any evidence of metastatic disease.   -The patient was seen by Dr. Mosetta Putt today.  His MMR, HER2, PD-L1 is still pending.  -The patient was seen with Dr. Mosetta Putt today.  We reviewed his staging CT scan which did not show any evidence of metastatic disease or lymphadenopathy.  We  rediscussed his options. He understands the only curative option is surgery.  He is not interested in any therapies to prolong his life such as radiation or chemotherapy at this time.  He understands that he may have progressive disease which causes symptoms in the future.  He understands that the cancer is harder to treat in the later stages and is easier to treat an early stages.  -The patient understands and he expressed his wish is to not pursue any treatment at this time. He reiterated on several occasions that he has lived a long and good life and is not interested treatments which may prolong his life. He is going to turn 56 in June.  -He was agreeable to follow-up visit in 3 months to assess his symptoms to ensure he does not develop any cancer related symptoms.  Dr. Mosetta Putt discussed that symptoms of progressive disease include abdominal pain, bleeding, worsening anemia, abdominal perforation, fatigue, appetite changes, and weight  loss. He is not having any symptoms at this time.  -His labs show some IDA.  I sent a prescription for iron supplements to his pharmacy. His potassium is slightly low.  I sent potassium supplements for a few days to his pharmacy.  -Of course, we will monitor for the results of his MMR, HER2, PD-L1.  If there is any additional treatment that he is a candidate for that we believe is a good option for him, we will call him with the results and make a follow-up appointment.  I will call him either way with the results. He did let me know, he would not want to consider any treatments that were given intravenously.    Renal Lesion -CT scan of the abdomen pelvis incidentally noted a 24 mm  -exophytic lesion arising laterally from the interpolar region of the left kidney that has enlarged since 2021 which may represent cyst or solid mass. Dr. Mosetta Putt feels this is separate and unrelated to the gastric cancer.  -We will avoid any further workup at this time as the patient is leaning  towards not pursuing any treatment   Social -The patient lives in independent living at [facility] -He has a son who lives in [place] and a daughter who lives in [place]   IDA -CBC shows Hbg 10.5 today. Iron studies at his last appointment show developing IDA.  -He is not interested in any IV treatments at this time -I sent him a prescription for ferrous sulfate to take p.o. daily.     Plan:  -Potassium and iron supplement sent to pharmacy -Labs and follow-up in 3 months -He was seen with Dr. Mosetta Putt today for a shared visit    No orders of the defined types were placed in this encounter.    I spent 25 minutes counseling the patient face to face. The total time spent in the appointment was 30 minutes.  Kartel Wolbert L Florice Hindle, [name] 10/24/22  Addendum I have seen the patient, examined him. I agree with the assessment and and plan and have edited the notes.   Pt is here for follow-up.  His CT chest was negative for metastasis.  I reviewed with patient.  Unfortunately his molecular testing on his biopsy samples are still pending.  We discussed he has localized disease, and I reviewed the treatment option of surgery, chemotherapy, and or radiation.  Patient clearly states that he does not want surgery.  He feels very well, asymptomatic, does not want any cancer treatment at this point.  We discussed the consequences of untreated cancer, which likely will cause pain and other symptoms, and metastasis, which will eventually take his life.  Patient voiced understanding, but still want to be observed for now.  He may be open to treatment if he develops cancer related symptoms down the road.  He has discussed this with his 2 children.  All questions were answered.  Will plan to follow-up him clinically in the future.   [name] MD  10/24/2022

## 2022-10-23 NOTE — Progress Notes (Signed)
I called [facility] to get MMR, PD-L1, and Her2 results.  I spoke with Selena Batten, she was not able to locate these results and has placed another request for them to be run.

## 2022-10-24 ENCOUNTER — Other Ambulatory Visit: Payer: Self-pay

## 2022-10-24 ENCOUNTER — Inpatient Hospital Stay (HOSPITAL_BASED_OUTPATIENT_CLINIC_OR_DEPARTMENT_OTHER): Payer: Medicare HMO | Admitting: Physician Assistant

## 2022-10-24 ENCOUNTER — Inpatient Hospital Stay: Payer: Medicare HMO

## 2022-10-24 DIAGNOSIS — C163 Malignant neoplasm of pyloric antrum: Secondary | ICD-10-CM

## 2022-10-24 DIAGNOSIS — C169 Malignant neoplasm of stomach, unspecified: Secondary | ICD-10-CM | POA: Diagnosis not present

## 2022-10-24 DIAGNOSIS — Z7189 Other specified counseling: Secondary | ICD-10-CM | POA: Diagnosis not present

## 2022-10-24 DIAGNOSIS — D509 Iron deficiency anemia, unspecified: Secondary | ICD-10-CM

## 2022-10-24 DIAGNOSIS — E876 Hypokalemia: Secondary | ICD-10-CM | POA: Diagnosis not present

## 2022-10-24 LAB — CBC WITH DIFFERENTIAL (CANCER CENTER ONLY)
Abs Immature Granulocytes: 0.03 10*3/uL (ref 0.00–0.07)
Basophils Absolute: 0 10*3/uL (ref 0.0–0.1)
Basophils Relative: 1 %
Eosinophils Absolute: 0.3 10*3/uL (ref 0.0–0.5)
Eosinophils Relative: 5 %
HCT: 33.4 % — ABNORMAL LOW (ref 39.0–52.0)
Hemoglobin: 10.5 g/dL — ABNORMAL LOW (ref 13.0–17.0)
Immature Granulocytes: 1 %
Lymphocytes Relative: 19 %
Lymphs Abs: 1.1 10*3/uL (ref 0.7–4.0)
MCH: 28.5 pg (ref 26.0–34.0)
MCHC: 31.4 g/dL (ref 30.0–36.0)
MCV: 90.8 fL (ref 80.0–100.0)
Monocytes Absolute: 0.5 10*3/uL (ref 0.1–1.0)
Monocytes Relative: 9 %
Neutro Abs: 3.7 10*3/uL (ref 1.7–7.7)
Neutrophils Relative %: 65 %
Platelet Count: 326 10*3/uL (ref 150–400)
RBC: 3.68 MIL/uL — ABNORMAL LOW (ref 4.22–5.81)
RDW: 15.1 % (ref 11.5–15.5)
WBC Count: 5.6 10*3/uL (ref 4.0–10.5)
nRBC: 0 % (ref 0.0–0.2)

## 2022-10-24 LAB — CMP (CANCER CENTER ONLY)
ALT: 16 U/L (ref 0–44)
AST: 18 U/L (ref 15–41)
Albumin: 3.5 g/dL (ref 3.5–5.0)
Alkaline Phosphatase: 88 U/L (ref 38–126)
Anion gap: 5 (ref 5–15)
BUN: 19 mg/dL (ref 8–23)
CO2: 36 mmol/L — ABNORMAL HIGH (ref 22–32)
Calcium: 8.9 mg/dL (ref 8.9–10.3)
Chloride: 99 mmol/L (ref 98–111)
Creatinine: 1.2 mg/dL (ref 0.61–1.24)
GFR, Estimated: 59 mL/min — ABNORMAL LOW (ref >60)
Glucose, Bld: 136 mg/dL — ABNORMAL HIGH (ref 70–99)
Potassium: 3.3 mmol/L — ABNORMAL LOW (ref 3.5–5.1)
Sodium: 140 mmol/L (ref 135–145)
Total Bilirubin: 0.2 mg/dL — ABNORMAL LOW (ref 0.3–1.2)
Total Protein: 6 g/dL — ABNORMAL LOW (ref 6.5–8.1)

## 2022-10-24 MED ORDER — FERROUS SULFATE 325 (65 FE) MG PO TBEC
325.0000 mg | DELAYED_RELEASE_TABLET | Freq: Every day | ORAL | 3 refills | Status: AC
Start: 2022-10-24 — End: ?

## 2022-10-24 MED ORDER — POTASSIUM CHLORIDE CRYS ER 20 MEQ PO TBCR
20.0000 meq | EXTENDED_RELEASE_TABLET | Freq: Every day | ORAL | 0 refills | Status: DC
Start: 2022-10-24 — End: 2022-11-17

## 2022-10-30 ENCOUNTER — Telehealth: Payer: Self-pay

## 2022-10-30 ENCOUNTER — Other Ambulatory Visit: Payer: Self-pay

## 2022-10-30 NOTE — Telephone Encounter (Signed)
Pt's daughter Tobi Bastos LVM requesting to speak with Dr. Mosetta Putt regarding the pt's care.  Tobi Bastos requested if Dr. Mosetta Putt could please return her call or if she's not listed as a person of contact for the pt please give her brother [name] a call who should be listed as a person of contact.  Tobi Bastos provided her contact information.  Reviewed pt's chart and Tobi Bastos has not been authorized as a person of contact for this pt.  LVM on pt's son's voicemail [name] stating Dr. Latanya Maudlin office received a call from his sister regarding the pt's care.  Requested if Renae Fickle would please contact Dr. Latanya Maudlin office if he and the pt has questions or concerns regarding the pt's tx plan/POC.  Notified Dr. Mosetta Putt of the daughter's call.

## 2022-10-31 ENCOUNTER — Encounter: Payer: Self-pay | Admitting: Hematology

## 2022-10-31 ENCOUNTER — Inpatient Hospital Stay: Attending: Hematology | Admitting: Hematology

## 2022-10-31 DIAGNOSIS — C163 Malignant neoplasm of pyloric antrum: Secondary | ICD-10-CM | POA: Diagnosis not present

## 2022-10-31 DIAGNOSIS — C169 Malignant neoplasm of stomach, unspecified: Secondary | ICD-10-CM | POA: Insufficient documentation

## 2022-10-31 DIAGNOSIS — Z79620 Long term (current) use of immunosuppressive biologic: Secondary | ICD-10-CM | POA: Insufficient documentation

## 2022-10-31 NOTE — Progress Notes (Signed)
[facility]   [phone] [phone]   Clinic Follow up Note   Patient Care Team: [name], NP as PCP - General (Nurse Practitioner) [name], DO as Consulting Physician (Neurology) [name], MD as Consulting Physician (Hematology)  Date of Service:  10/31/2022  I connected with [name] on 10/31/2022 at  8:20 AM EDT by telephone visit and verified that I am speaking with the correct person using two identifiers.  I discussed the limitations, risks, security and privacy concerns of performing an evaluation and management service by telephone and the availability of in person appointments. I also discussed with the patient that there may be a patient responsible charge related to this service. The patient expressed understanding and agreed to proceed.   Other persons participating in the visit and their role in the encounter:  Son  Patient's location:  Home Provider's location:  [facility] Office  CHIEF COMPLAINT: f/u of Gastric Cancer   CURRENT THERAPY:  Observation   ASSESSMENT & PLAN:  [name] is a 89 y.o. male with   Gastric Adenocarcinoma, MSI-H, HER2(-) -He presented with abdominal pain in February 2024. CT in the ER noted cavitation ulcer arising from the posterior gastric antrum at the pylorus.   -He underwent EGD on 09/30/22 by Dr. Myrtie Neither. This demonstrated non-bleeding cratered gastric ulcer with irregular and heaped edges, measuring 20x25 mm in the largest dimension. Biopsy confirmed invasive moderately differentiated adenocarcinoma -Staging scan was negative for nodal or distant metastasis. -We previously discussed the option of surgery, radiation, and chemotherapy, he declined all.  He has minimal symptoms, and evaluate his quality of life more. 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 -I recommend immunotherapy Keytruda every 3 weeks.  I discussed the benefit and potential side effect.  Given the high response of Keytruda in MSI high GI cancers, and overall good tolerance, I strongly encouraged patient to consider. -Potential side effect, especially risk of pneumonitis, colitis, thyroid and other endocrine disorders, skin rash, arthralgia, etc., were discussed with him in detail, he voiced good understanding.  He agrees to try. -I also spoke with his son, and discussed the above. -Will schedule chemo class, and tentatively start Keytruda in 1 to 2 weeks.   PLAN: -I reviewed his molecular testing results with patient and his son, I recommended Keytruda every 3 weeks, patient agreed.  Will start in 1 to 2 weeks, schedule chemo class before that  -f/u with first cycle tx    . SUMMARY OF ONCOLOGIC HISTORY: Oncology History  Gastric cancer (HCC)  08/13/2022 Imaging   CT ABDOMEN PELVIS W CONTRAST   IMPRESSION: 1. Cavitating ulcer arising from the posterior wall of the gastric antrum at the pylorus with mild surrounding inflammatory stranding. No extraluminal gas or loculated fluid collection identified. No evidence of obstruction. Correlation with endoscopy is recommended. 2. Moderate coronary artery calcification. 3. Minimal right nonobstructing nephrolithiasis. 4. Moderate distal colonic diverticulosis. 5. Surgical changes of probable right inguinal hernia repair. Increasing poorly circumscribed infiltrative soft tissue within this region may represent a local inflammatory process or progressive fibrosis. This is not well characterized on this examination and correlation with surgical history and clinical examination is recommended. 6. Interval increase in size of a 24 mm exophytic lesion arising laterally from the interpolar region of the left kidney. This  may represent a hyperdense renal cyst or solid  renal mass. If indicated, this would be better assessed with contrast enhanced CT or MRI examination once the patient's acute issues have resolved.   09/30/2022 Procedure   Upper Endoscopy   Findings: -1 benign-appearing intrinsic mild stenosis was found at the gastroesophageal junction the stenosis was transversed. -The exam of the esophagus was otherwise normal. -1 nonbleeding cratered gastric ulcer with irregular and heaped edges as well as pigmented material on the base was found on the anterior wall of the prepyloric region of the stomach.  The lesion was 20 to 25 mm in largest dimension.  Biopsies were taken with a cold forcep for histology. -Multiple small sessile fundic gland polyps with no stigmata of recent bleeding were found in the gastric fundus and in the gastric body. -Patchy mild erythematous mucosa was found in the entire examined stomach.  Several biopsies were obtained in the gastric body and in the gastric antrum with cold forceps for histology to rule out H. Pylori -The examined duodenum was otherwise normal   09/30/2022 Pathology Results   Accession: [id number]  FINAL DIAGNOSIS Diagnosis 1. Stomach, biopsy, ulcer - INVASIVE MODERATELY DIFFERENTIATED ADENOCARCINOMA, SEE COMMENT 2. Stomach, biopsy, antrum and body - GASTRIC ANTRAL MUCOSA WITH NONSPECIFIC REACTIVE GASTROPATHY - GASTRIC OXYNTIC MUCOSA WITH PARIETAL CELL HYPERPLASIA AS CAN BE SEEN IN HYPERGASTRINEMIC STATES SUCH AS PPI THERAPY. - HELICOBACTER PYLORI-LIKE ORGANISMS ARE NOT IDENTIFIED ON ROUTINE H&E STAIN   10/09/2022 Initial Diagnosis   Gastric cancer   10/13/2022 Imaging   CT CHEST W CONTRAST   IMPRESSION: 1. No evidence of metastatic disease in the chest. 2. Persistent gastric wall thickening in the visualized posterior antral pyloric region, compatible with known gastric malignancy. 3. Three-vessel coronary atherosclerosis. 4.  Aortic Atherosclerosis (ICD10-I70.0).   11/07/2022 -  Chemotherapy    Patient is on Treatment Plan : GASTROESOPHAGEAL Pembrolizumab (200) q21d        INTERVAL HISTORY:  [name] was contacted for a follow up of  Gastric Cancer  . He was last seen by [name] Cassie on 10/24/2022.  I called the patient and his son both, and spoke with him over the phone.  Patient is doing well, denies any new symptoms.  Pt  son is aware of the Cancer diagnoses.   All other systems were reviewed with the patient and are negative.  MEDICAL HISTORY:  Past Medical History:  Diagnosis Date   Anxiety    Cataract    Depression    Diabetes mellitus    GERD (gastroesophageal reflux disease)    Hyperlipemia    Hypertension     SURGICAL HISTORY: Past Surgical History:  Procedure Laterality Date   CHOLECYSTECTOMY N/A 07/30/2019   Procedure: LAPAROSCOPIC CHOLECYSTECTOMY;  Surgeon: [name], [name], MD;  Location: [facility];  Service: General;  Laterality: N/A;   FRACTURE SURGERY     HERNIA REPAIR      I have reviewed the social history and family history with the patient and they are unchanged from previous note.  ALLERGIES:  has No Known Allergies.  MEDICATIONS:  Current Outpatient Medications  Medication Sig Dispense Refill   amLODipine (NORVASC) 10 MG tablet Take 10 mg by mouth daily.      buPROPion (WELLBUTRIN XL) 300 MG 24 hr tablet Take 300 mg by mouth daily.  6   ferrous sulfate 325 (65 FE) MG EC tablet Take 1 tablet (325 mg total) by mouth daily with breakfast. 30 tablet 3  gabapentin (NEURONTIN) 800 MG tablet Take 800 mg by mouth 2 (two) times daily.     L-Methylfolate-Algae-B12-B6 (METANX PO) Take 1 capsule by mouth 2 (two) times daily.     LORazepam (ATIVAN) 0.5 MG tablet Take 0.5 mg by mouth 2 (two) times daily.      Multiple Vitamin (MULITIVITAMIN WITH MINERALS) TABS Take 1 tablet by mouth daily.     Omega-3 Fatty Acids (FISH OIL) 1000 MG CAPS Take by mouth.     omeprazole (PRILOSEC) 40 MG capsule Take 1 capsule (40 mg total) by mouth in the  morning and at bedtime. 60 capsule 1   potassium chloride SA (KLOR-CON M) 20 MEQ tablet Take 1 tablet (20 mEq total) by mouth daily. 3 tablet 0   primidone (MYSOLINE) 50 MG tablet Take 50 mg by mouth 2 (two) times daily.     tamsulosin (FLOMAX) 0.4 MG CAPS capsule Take 0.8 mg by mouth daily after supper.     Current Facility-Administered Medications  Medication Dose Route Frequency Provider Last Rate Last Admin   0.9 %  sodium chloride infusion  500 mL Intravenous Once Jenel Lucks, MD        PHYSICAL EXAMINATION: ECOG PERFORMANCE STATUS: 0 - Asymptomatic  There were no vitals filed for this visit. Wt Readings from Last 3 Encounters:  10/10/22 186 lb (84.4 kg)  09/30/22 187 lb (84.8 kg)  09/18/22 187 lb 4 oz (84.9 kg)     No vitals taken today, Exam not performed today  LABORATORY DATA:  I have reviewed the data as listed    Latest Ref Rng & Units 10/24/2022    8:43 AM 10/10/2022   12:45 PM 08/13/2022    4:53 PM  CBC  WBC 4.0 - 10.5 K/uL 5.6  5.6  6.9   Hemoglobin 13.0 - 17.0 g/dL 16.1  09.6  04.5   Hematocrit 39.0 - 52.0 % 33.4  33.6  38.0   Platelets 150 - 400 K/uL 326  319  357         Latest Ref Rng & Units 10/24/2022    8:43 AM 10/10/2022   12:45 PM 08/13/2022    4:53 PM  CMP  Glucose 70 - 99 mg/dL 409  811  914   BUN 8 - 23 mg/dL 19  19  17    Creatinine 0.61 - 1.24 mg/dL 7.82  9.56  2.13   Sodium 135 - 145 mmol/L 140  139  134   Potassium 3.5 - 5.1 mmol/L 3.3  3.6  3.2   Chloride 98 - 111 mmol/L 99  100  93   CO2 22 - 32 mmol/L 36  34  30   Calcium 8.9 - 10.3 mg/dL 8.9  9.2  9.6   Total Protein 6.5 - 8.1 g/dL 6.0  6.4  6.6   Total Bilirubin 0.3 - 1.2 mg/dL 0.2  0.3  0.1   Alkaline Phos 38 - 126 U/L 88  87  101   AST 15 - 41 U/L 18  18  20    ALT 0 - 44 U/L 16  18  17        RADIOGRAPHIC STUDIES: I have personally reviewed the radiological images as listed and agreed with the findings in the report. No results found.    Orders Placed This Encounter   Procedures   CBC with Differential (Cancer Center Only)    Standing Status:   Future    Standing Expiration Date:   11/07/2023   CMP (  Cancer Center only)    Standing Status:   Future    Standing Expiration Date:   11/07/2023   T4    Standing Status:   Future    Standing Expiration Date:   11/07/2023   TSH    Standing Status:   Future    Standing Expiration Date:   11/07/2023   CBC with Differential (Cancer Center Only)    Standing Status:   Future    Standing Expiration Date:   11/28/2023   CMP (Cancer Center only)    Standing Status:   Future    Standing Expiration Date:   11/28/2023   CBC with Differential (Cancer Center Only)    Standing Status:   Future    Standing Expiration Date:   12/19/2023   CMP (Cancer Center only)    Standing Status:   Future    Standing Expiration Date:   12/19/2023   T4    Standing Status:   Future    Standing Expiration Date:   12/19/2023   TSH    Standing Status:   Future    Standing Expiration Date:   12/19/2023   CBC with Differential (Cancer Center Only)    Standing Status:   Future    Standing Expiration Date:   01/09/2024   CMP (Cancer Center only)    Standing Status:   Future    Standing Expiration Date:   01/09/2024   All questions were answered. The patient knows to call the clinic with any problems, questions or concerns. No barriers to learning was detected. The total time spent in the appointment was 35 minutes.     [name], MD 10/31/2022   Carolin Coy am acting as scribe for [name], MD.   I have reviewed the above documentation for accuracy and completeness, and I agree with the above.

## 2022-10-31 NOTE — Progress Notes (Signed)
START OFF PATHWAY REGIMEN - Gastroesophageal   [id number]:Pembrolizumab 200 mg IV D1 q21 Days:   A cycle is every 21 days:     Pembrolizumab   **Always confirm dose/schedule in your pharmacy ordering system**  Patient Characteristics: Gastric, Adenocarcinoma, Preoperative or Nonsurgical Candidate, M0 (Clinical Staging), cT1 - cT2, cN0, Nonsurgical Candidate, Not a Radiation Candidate, HER2 Negative/Unknown, PD?L1 Expression Positive CPS ? 5 Therapeutic Status: Preoperative or Nonsurgical Candidate, M0 (Clinical Staging) Histology: Adenocarcinoma Disease Classification: Gastric AJCC N Category: cN0 AJCC M Category: cM0 AJCC 8 Stage Grouping: Unknown AJCC T Category: cTX Patient Characteristics: Nonsurgical Candidate Patient Characteristics: Not a Radiation Candidate HER2 Status: Awaiting Test Results PD-L1 Expression Status: PD-L1 Expression Positive CPS ? 5 Intent of Therapy: Non-Curative / Palliative Intent, Discussed with Patient

## 2022-11-01 ENCOUNTER — Other Ambulatory Visit: Payer: Self-pay

## 2022-11-03 ENCOUNTER — Encounter: Payer: Self-pay | Admitting: Hematology

## 2022-11-03 ENCOUNTER — Telehealth: Payer: Self-pay | Admitting: Hematology

## 2022-11-03 NOTE — Telephone Encounter (Signed)
Contacted patient to scheduled appointments. Left message with appointment details and a call back number if patient had any questions or could not accommodate the time we provided.   

## 2022-11-07 ENCOUNTER — Telehealth: Payer: Self-pay | Admitting: *Deleted

## 2022-11-07 ENCOUNTER — Inpatient Hospital Stay

## 2022-11-07 ENCOUNTER — Inpatient Hospital Stay: Admitting: Hematology

## 2022-11-07 NOTE — Telephone Encounter (Signed)
Left message for patient to return call in regard to missed appts on 11/07/22. Spoke to son to and left a message with him to have his father return our call to reschedule appointments.

## 2022-11-10 ENCOUNTER — Telehealth: Payer: Self-pay | Admitting: Hematology

## 2022-11-10 NOTE — Telephone Encounter (Signed)
Contacted patient to scheduled appointments. Left message with appointment details and a call back number if patient had any questions or could not accommodate the time we provided.   

## 2022-11-12 ENCOUNTER — Telehealth: Payer: Self-pay | Admitting: Hematology

## 2022-11-13 ENCOUNTER — Telehealth: Payer: Self-pay | Admitting: Hematology

## 2022-11-13 ENCOUNTER — Inpatient Hospital Stay (HOSPITAL_COMMUNITY)
Admission: EM | Admit: 2022-11-13 | Discharge: 2022-11-17 | DRG: 378 | Disposition: A | Discharge status: Home / Self Care | Payer: Medicare HMO | Attending: Internal Medicine | Admitting: Internal Medicine

## 2022-11-13 ENCOUNTER — Emergency Department (HOSPITAL_COMMUNITY): Payer: Medicare HMO

## 2022-11-13 ENCOUNTER — Encounter (HOSPITAL_COMMUNITY): Payer: Self-pay

## 2022-11-13 DIAGNOSIS — I10 Essential (primary) hypertension: Secondary | ICD-10-CM | POA: Diagnosis present

## 2022-11-13 DIAGNOSIS — K254 Chronic or unspecified gastric ulcer with hemorrhage: Principal | ICD-10-CM | POA: Diagnosis present

## 2022-11-13 DIAGNOSIS — Z79899 Other long term (current) drug therapy: Secondary | ICD-10-CM | POA: Diagnosis not present

## 2022-11-13 DIAGNOSIS — N400 Enlarged prostate without lower urinary tract symptoms: Secondary | ICD-10-CM | POA: Diagnosis present

## 2022-11-13 DIAGNOSIS — M7989 Other specified soft tissue disorders: Secondary | ICD-10-CM | POA: Diagnosis present

## 2022-11-13 DIAGNOSIS — E119 Type 2 diabetes mellitus without complications: Secondary | ICD-10-CM | POA: Diagnosis present

## 2022-11-13 DIAGNOSIS — C169 Malignant neoplasm of stomach, unspecified: Secondary | ICD-10-CM | POA: Diagnosis present

## 2022-11-13 DIAGNOSIS — Z9049 Acquired absence of other specified parts of digestive tract: Secondary | ICD-10-CM

## 2022-11-13 DIAGNOSIS — F419 Anxiety disorder, unspecified: Secondary | ICD-10-CM | POA: Diagnosis present

## 2022-11-13 DIAGNOSIS — R7989 Other specified abnormal findings of blood chemistry: Secondary | ICD-10-CM

## 2022-11-13 DIAGNOSIS — K5900 Constipation, unspecified: Secondary | ICD-10-CM | POA: Diagnosis present

## 2022-11-13 DIAGNOSIS — Z7984 Long term (current) use of oral hypoglycemic drugs: Secondary | ICD-10-CM

## 2022-11-13 DIAGNOSIS — I2510 Atherosclerotic heart disease of native coronary artery without angina pectoris: Secondary | ICD-10-CM | POA: Diagnosis present

## 2022-11-13 DIAGNOSIS — Z66 Do not resuscitate: Secondary | ICD-10-CM | POA: Diagnosis present

## 2022-11-13 DIAGNOSIS — R195 Other fecal abnormalities: Secondary | ICD-10-CM | POA: Diagnosis present

## 2022-11-13 DIAGNOSIS — K922 Gastrointestinal hemorrhage, unspecified: Secondary | ICD-10-CM | POA: Diagnosis present

## 2022-11-13 DIAGNOSIS — K259 Gastric ulcer, unspecified as acute or chronic, without hemorrhage or perforation: Secondary | ICD-10-CM | POA: Diagnosis not present

## 2022-11-13 DIAGNOSIS — E876 Hypokalemia: Secondary | ICD-10-CM | POA: Diagnosis present

## 2022-11-13 DIAGNOSIS — D509 Iron deficiency anemia, unspecified: Secondary | ICD-10-CM | POA: Diagnosis present

## 2022-11-13 DIAGNOSIS — R531 Weakness: Secondary | ICD-10-CM | POA: Diagnosis present

## 2022-11-13 DIAGNOSIS — R079 Chest pain, unspecified: Secondary | ICD-10-CM | POA: Diagnosis present

## 2022-11-13 DIAGNOSIS — Z515 Encounter for palliative care: Secondary | ICD-10-CM

## 2022-11-13 DIAGNOSIS — E785 Hyperlipidemia, unspecified: Secondary | ICD-10-CM | POA: Diagnosis present

## 2022-11-13 DIAGNOSIS — Z7189 Other specified counseling: Secondary | ICD-10-CM | POA: Diagnosis not present

## 2022-11-13 DIAGNOSIS — D649 Anemia, unspecified: Secondary | ICD-10-CM | POA: Diagnosis not present

## 2022-11-13 DIAGNOSIS — I214 Non-ST elevation (NSTEMI) myocardial infarction: Secondary | ICD-10-CM | POA: Diagnosis not present

## 2022-11-13 DIAGNOSIS — D72829 Elevated white blood cell count, unspecified: Secondary | ICD-10-CM | POA: Diagnosis present

## 2022-11-13 DIAGNOSIS — D62 Acute posthemorrhagic anemia: Secondary | ICD-10-CM | POA: Diagnosis present

## 2022-11-13 DIAGNOSIS — F32A Depression, unspecified: Secondary | ICD-10-CM | POA: Diagnosis present

## 2022-11-13 LAB — URINALYSIS, W/ REFLEX TO CULTURE (INFECTION SUSPECTED)
Bacteria, UA: NONE SEEN
Bilirubin Urine: NEGATIVE
Glucose, UA: NEGATIVE mg/dL
Hgb urine dipstick: NEGATIVE
Ketones, ur: NEGATIVE mg/dL
Leukocytes,Ua: NEGATIVE
Nitrite: NEGATIVE
Protein, ur: NEGATIVE mg/dL
Specific Gravity, Urine: 1.008 (ref 1.005–1.030)
pH: 6 (ref 5.0–8.0)

## 2022-11-13 LAB — CBC WITH DIFFERENTIAL/PLATELET
Abs Immature Granulocytes: 0.02 10*3/uL (ref 0.00–0.07)
Basophils Absolute: 0 10*3/uL (ref 0.0–0.1)
Basophils Relative: 1 %
Eosinophils Absolute: 0.1 10*3/uL (ref 0.0–0.5)
Eosinophils Relative: 2 %
HCT: 21.3 % — ABNORMAL LOW (ref 39.0–52.0)
Hemoglobin: 6.5 g/dL — CL (ref 13.0–17.0)
Immature Granulocytes: 0 %
Lymphocytes Relative: 20 %
Lymphs Abs: 1 10*3/uL (ref 0.7–4.0)
MCH: 29.7 pg (ref 26.0–34.0)
MCHC: 30.5 g/dL (ref 30.0–36.0)
MCV: 97.3 fL (ref 80.0–100.0)
Monocytes Absolute: 0.5 10*3/uL (ref 0.1–1.0)
Monocytes Relative: 9 %
Neutro Abs: 3.6 10*3/uL (ref 1.7–7.7)
Neutrophils Relative %: 68 %
Platelets: 287 10*3/uL (ref 150–400)
RBC: 2.19 MIL/uL — ABNORMAL LOW (ref 4.22–5.81)
RDW: 16.8 % — ABNORMAL HIGH (ref 11.5–15.5)
WBC: 5.2 10*3/uL (ref 4.0–10.5)
nRBC: 0 % (ref 0.0–0.2)

## 2022-11-13 LAB — TYPE AND SCREEN

## 2022-11-13 LAB — COMPREHENSIVE METABOLIC PANEL
ALT: 21 U/L (ref 0–44)
AST: 20 U/L (ref 15–41)
Albumin: 2.9 g/dL — ABNORMAL LOW (ref 3.5–5.0)
Alkaline Phosphatase: 50 U/L (ref 38–126)
Anion gap: 8 (ref 5–15)
BUN: 34 mg/dL — ABNORMAL HIGH (ref 8–23)
CO2: 30 mmol/L (ref 22–32)
Calcium: 8.7 mg/dL — ABNORMAL LOW (ref 8.9–10.3)
Chloride: 96 mmol/L — ABNORMAL LOW (ref 98–111)
Creatinine, Ser: 1.13 mg/dL (ref 0.61–1.24)
GFR, Estimated: >60 mL/min (ref >60)
Glucose, Bld: 181 mg/dL — ABNORMAL HIGH (ref 70–99)
Potassium: 3.1 mmol/L — ABNORMAL LOW (ref 3.5–5.1)
Sodium: 134 mmol/L — ABNORMAL LOW (ref 135–145)
Total Bilirubin: 0.4 mg/dL (ref 0.3–1.2)
Total Protein: 5.4 g/dL — ABNORMAL LOW (ref 6.5–8.1)

## 2022-11-13 LAB — TROPONIN I (HIGH SENSITIVITY)
Troponin I (High Sensitivity): 165 ng/L (ref <18)
Troponin I (High Sensitivity): 138 ng/L (ref <18)
Troponin I (High Sensitivity): 130 ng/L (ref <18)

## 2022-11-13 LAB — GLUCOSE, CAPILLARY: Glucose-Capillary: 158 mg/dL — ABNORMAL HIGH (ref 70–99)

## 2022-11-13 LAB — BPAM RBC: ISSUE DATE / TIME: 202405162357

## 2022-11-13 LAB — ABO/RH: ABO/RH(D): A POS

## 2022-11-13 LAB — PREPARE RBC (CROSSMATCH)

## 2022-11-13 LAB — TSH: TSH: 1.38 u[IU]/mL (ref 0.350–4.500)

## 2022-11-13 LAB — POC OCCULT BLOOD, ED: Fecal Occult Bld: POSITIVE — AB

## 2022-11-13 LAB — LIPASE, BLOOD: Lipase: 35 U/L (ref 11–51)

## 2022-11-13 MED ORDER — PANTOPRAZOLE SODIUM 40 MG IV SOLR
40.0000 mg | Freq: Once | INTRAVENOUS | Status: AC
  Administered 2022-11-13: 40 mg via INTRAVENOUS
  Filled 2022-11-13: qty 10

## 2022-11-13 MED ORDER — SODIUM CHLORIDE 0.9% IV SOLUTION: Freq: Once | INTRAVENOUS | Status: DC

## 2022-11-13 MED ORDER — MORPHINE SULFATE (PF) 2 MG/ML IV SOLN
2.0000 mg | Freq: Once | INTRAVENOUS | Status: AC
  Administered 2022-11-13: 2 mg via INTRAVENOUS
  Filled 2022-11-13: qty 1

## 2022-11-13 MED ORDER — BUPROPION HCL ER (XL) 150 MG PO TB24
300.0000 mg | ORAL_TABLET | Freq: Every day | ORAL | Status: DC
  Administered 2022-11-13 – 2022-11-17 (×5): 300 mg via ORAL
  Filled 2022-11-13 (×5): qty 2

## 2022-11-13 MED ORDER — MORPHINE SULFATE (PF) 2 MG/ML IV SOLN
1.0000 mg | INTRAVENOUS | Status: DC | PRN
  Filled 2022-11-13: qty 1

## 2022-11-13 MED ORDER — ACETAMINOPHEN 325 MG PO TABS
650.0000 mg | ORAL_TABLET | ORAL | Status: DC | PRN
  Administered 2022-11-14 – 2022-11-16 (×2): 650 mg via ORAL
  Filled 2022-11-13 (×2): qty 2

## 2022-11-13 MED ORDER — ONDANSETRON HCL 4 MG/2ML IJ SOLN: 4.0000 mg | Freq: Four times a day (QID) | INTRAMUSCULAR | Status: DC | PRN

## 2022-11-13 MED ORDER — PANTOPRAZOLE SODIUM 40 MG IV SOLR
40.0000 mg | Freq: Two times a day (BID) | INTRAVENOUS | Status: DC
  Administered 2022-11-14: 40 mg via INTRAVENOUS
  Filled 2022-11-13: qty 10

## 2022-11-13 MED ORDER — NITROGLYCERIN 0.4 MG SL SUBL
SUBLINGUAL_TABLET | SUBLINGUAL | Status: AC
  Filled 2022-11-13: qty 1

## 2022-11-13 MED ORDER — ONDANSETRON HCL 4 MG/2ML IJ SOLN
4.0000 mg | Freq: Once | INTRAMUSCULAR | Status: AC
  Administered 2022-11-13: 4 mg via INTRAVENOUS
  Filled 2022-11-13: qty 2

## 2022-11-13 MED ORDER — SODIUM CHLORIDE 0.9 % IV SOLN: INTRAVENOUS | Status: DC

## 2022-11-13 MED ORDER — LORAZEPAM 0.5 MG PO TABS
0.5000 mg | ORAL_TABLET | Freq: Two times a day (BID) | ORAL | Status: DC
  Administered 2022-11-13 – 2022-11-17 (×8): 0.5 mg via ORAL
  Filled 2022-11-13 (×8): qty 1

## 2022-11-13 MED ORDER — NITROGLYCERIN 0.4 MG SL SUBL
0.4000 mg | SUBLINGUAL_TABLET | SUBLINGUAL | Status: AC | PRN
  Administered 2022-11-13 – 2022-11-15 (×3): 0.4 mg via SUBLINGUAL
  Filled 2022-11-13: qty 1

## 2022-11-13 MED ORDER — FERROUS SULFATE 325 (65 FE) MG PO TABS
325.0000 mg | ORAL_TABLET | Freq: Every day | ORAL | Status: DC
  Administered 2022-11-14 – 2022-11-17 (×4): 325 mg via ORAL
  Filled 2022-11-13 (×4): qty 1

## 2022-11-13 MED ORDER — TAMSULOSIN HCL 0.4 MG PO CAPS
0.8000 mg | ORAL_CAPSULE | Freq: Every day | ORAL | Status: DC
  Administered 2022-11-14 – 2022-11-16 (×3): 0.8 mg via ORAL
  Filled 2022-11-13 (×3): qty 2

## 2022-11-13 MED ORDER — AMLODIPINE BESYLATE 5 MG PO TABS
10.0000 mg | ORAL_TABLET | Freq: Every day | ORAL | Status: DC
  Administered 2022-11-13 – 2022-11-15 (×3): 10 mg via ORAL
  Filled 2022-11-13 (×3): qty 2

## 2022-11-13 MED ORDER — PRIMIDONE 50 MG PO TABS
50.0000 mg | ORAL_TABLET | Freq: Two times a day (BID) | ORAL | Status: DC
  Administered 2022-11-14 – 2022-11-17 (×7): 50 mg via ORAL
  Filled 2022-11-13 (×8): qty 1

## 2022-11-13 MED ORDER — GABAPENTIN 400 MG PO CAPS
800.0000 mg | ORAL_CAPSULE | Freq: Two times a day (BID) | ORAL | Status: DC
  Administered 2022-11-13 – 2022-11-17 (×8): 800 mg via ORAL
  Filled 2022-11-13 (×8): qty 2

## 2022-11-13 MED ORDER — POTASSIUM CHLORIDE CRYS ER 20 MEQ PO TBCR
40.0000 meq | EXTENDED_RELEASE_TABLET | ORAL | Status: AC
  Administered 2022-11-13: 40 meq via ORAL
  Filled 2022-11-13: qty 2

## 2022-11-13 NOTE — ED Notes (Signed)
Updated pt son and Barrett Henle about admission, per pt request

## 2022-11-13 NOTE — ED Notes (Signed)
ED TO INPATIENT HANDOFF REPORT  Name/Age/Gender [name] 89 y.o. male  Code Status Code Status History     Date Active Date Inactive Code Status Order ID Comments User Context   07/29/2019 2105 08/02/2019 2354 Full Code [id number]  [name], [name], MD ED       Home/SNF/Other Independent Living  Chief Complaint GIB (gastrointestinal bleeding) [K92.2]  Level of Care/Admitting Diagnosis ED Disposition     ED Disposition  Admit   Condition  --   Comment  Hospital Area: [facility] [[id number]]  Level of Care: Telemetry [5]  Admit to tele based on following criteria: Other see comments  Comments: chest pain  May admit patient to [facility] or [facility] if equivalent level of care is available:: No  Covid Evaluation: Asymptomatic - no recent exposure (last 10 days) testing not required  Diagnosis: GIB (gastrointestinal bleeding) [086578]  Admitting Physician: [name] [[id number]]  Attending Physician: [name] [id number]  Certification:: I certify this patient will need inpatient services for at least 2 midnights  Estimated Length of Stay: 2          Medical History Past Medical History:  Diagnosis Date   Anxiety    Cataract    Depression    Diabetes mellitus    GERD (gastroesophageal reflux disease)    Hyperlipemia    Hypertension     Allergies No Known Allergies  IV Location/Drains/Wounds Patient Lines/Drains/Airways Status     Active Line/Drains/Airways     Name Placement date Placement time Site Days   Peripheral IV 11/13/22 20 G Right Antecubital 11/13/22  1752  Antecubital  less than 1   Incision - 4 Ports Abdomen 1: Umbilicus;Upper 2: Superior;Umbilicus 3: Right;Medial 4: Right;Lateral 07/30/19  1020  -- 1202            Labs/Imaging Results for orders placed or performed during the hospital encounter of 11/13/22 (from the past 48 hour(s))  CBC with Differential/Platelet     Status: Abnormal    Collection Time: 11/13/22  5:50 PM  Result Value Ref Range   WBC 5.2 4.0 - 10.5 K/uL   RBC 2.19 (L) 4.22 - 5.81 MIL/uL   Hemoglobin 6.5 (LL) 13.0 - 17.0 g/dL    Comment: This critical result has verified and been called to RN J Anaise Sterbenz by Omar Person on 05 16 2024 at 1829, and has been read back.    HCT 21.3 (L) 39.0 - 52.0 %   MCV 97.3 80.0 - 100.0 fL   MCH 29.7 26.0 - 34.0 pg   MCHC 30.5 30.0 - 36.0 g/dL   RDW 40.1 (H) 02.7 - 25.3 %   Platelets 287 150 - 400 K/uL   nRBC 0.0 0.0 - 0.2 %   Neutrophils Relative % 68 %   Neutro Abs 3.6 1.7 - 7.7 K/uL   Lymphocytes Relative 20 %   Lymphs Abs 1.0 0.7 - 4.0 K/uL   Monocytes Relative 9 %   Monocytes Absolute 0.5 0.1 - 1.0 K/uL   Eosinophils Relative 2 %   Eosinophils Absolute 0.1 0.0 - 0.5 K/uL   Basophils Relative 1 %   Basophils Absolute 0.0 0.0 - 0.1 K/uL   Immature Granulocytes 0 %   Abs Immature Granulocytes 0.02 0.00 - 0.07 K/uL    Comment: Performed at [facility], [address] [place], [place] [address]  Comprehensive metabolic panel     Status: Abnormal   Collection Time: 11/13/22  5:50  PM  Result Value Ref Range   Sodium 134 (L) 135 - 145 mmol/L   Potassium 3.1 (L) 3.5 - 5.1 mmol/L   Chloride 96 (L) 98 - 111 mmol/L   CO2 30 22 - 32 mmol/L   Glucose, Bld 181 (H) 70 - 99 mg/dL    Comment: Glucose reference range applies only to samples taken after fasting for at least 8 hours.   BUN 34 (H) 8 - 23 mg/dL   Creatinine, Ser 6.04 0.61 - 1.24 mg/dL   Calcium 8.7 (L) 8.9 - 10.3 mg/dL   Total Protein 5.4 (L) 6.5 - 8.1 g/dL   Albumin 2.9 (L) 3.5 - 5.0 g/dL   AST 20 15 - 41 U/L   ALT 21 0 - 44 U/L   Alkaline Phosphatase 50 38 - 126 U/L   Total Bilirubin 0.4 0.3 - 1.2 mg/dL   GFR, Estimated >54 >09 mL/min    Comment: (NOTE) Calculated using the CKD-EPI Creatinine Equation (2021)    Anion gap 8 5 - 15    Comment: Performed at [facility], [address], [place], [place]  [address]  TSH     Status: None   Collection Time: 11/13/22  5:50 PM  Result Value Ref Range   TSH 1.380 0.350 - 4.500 uIU/mL    Comment: Performed by a 3rd Generation assay with a functional sensitivity of <=0.01 uIU/mL. Performed at [facility], [address], [place], [place] [address]   Lipase, blood     Status: None   Collection Time: 11/13/22  5:50 PM  Result Value Ref Range   Lipase 35 11 - 51 U/L    Comment: Performed at [facility], [address] [place], [place] [address]  Troponin I (High Sensitivity)     Status: Abnormal   Collection Time: 11/13/22  5:50 PM  Result Value Ref Range   Troponin I (High Sensitivity) 165 (HH) <18 ng/L    Comment: CRITICAL RESULT CALLED TO, READ BACK BY AND VERIFIED WITH J.Koston Hennes, RN AT 2004 ON 05.16.24 BY N.THOMPSON (NOTE) Elevated high sensitivity troponin I (hsTnI) values and significant  changes across serial measurements may suggest ACS but many other  chronic and acute conditions are known to elevate hsTnI results.  Refer to the "Links" section for chest pain algorithms and additional  guidance. Performed at [facility], [address] [place], [place] [address]   Prepare RBC (crossmatch)     Status: None   Collection Time: 11/13/22  6:35 PM  Result Value Ref Range   Order Confirmation      ORDER PROCESSED BY BLOOD BANK Performed at [facility], [address], [place], [place] [address]   Type and screen [facility]     Status: None (Preliminary result)   Collection Time: 11/13/22  6:37 PM  Result Value Ref Range   ABO/RH(D) A POS    Antibody Screen NEG    Sample Expiration 11/16/2022,2359    Unit Number [id number]    Blood Component Type RED CELLS,LR    Unit division 00    Status of Unit ALLOCATED    Transfusion Status OK TO TRANSFUSE    Crossmatch Result      Compatible Performed at [facility], [address] [place], [place] [address]    Unit Number [id number]    Blood Component Type RED CELLS,LR    Unit division 00    Status  of Unit ALLOCATED    Transfusion Status OK TO TRANSFUSE    Crossmatch Result Compatible   POC occult blood, ED     Status: Abnormal   Collection Time: 11/13/22  6:45 PM  Result Value Ref Range   Fecal Occult Bld POSITIVE (A) NEGATIVE  ABO/Rh     Status: None   Collection Time: 11/13/22  7:20 PM  Result Value Ref Range   ABO/RH(D)      A POS Performed at [facility], [address], [place], [place] [address]    CT Head Wo Contrast  Result Date: 11/13/2022 CLINICAL DATA:  Neuro deficit, acute, stroke suspected, vision change EXAM: CT HEAD WITHOUT CONTRAST TECHNIQUE: Contiguous axial images were obtained from the base of the skull through the vertex without intravenous contrast. RADIATION DOSE REDUCTION: This exam was performed according to the departmental dose-optimization program which includes automated exposure control, adjustment of the mA and/or kV according to patient size and/or use of iterative reconstruction technique. COMPARISON:  10/18/2004 FINDINGS: Brain: Mild parenchymal volume loss is present, commensurate with the patient's age and stable since prior examination. Mild ventriculomegaly has developed, slightly disproportionate to the degree of parenchymal volume loss, possibly reflecting changes of asymmetric central atrophy or communicating hydrocephalus. No acute intracranial hemorrhage or infarct. No abnormal mass effect or midline shift. No abnormal intra or extra-axial mass lesion or fluid collection. Cerebellum is unremarkable. Vascular: No hyperdense vessel or unexpected calcification. Skull: Normal. Negative for fracture or focal lesion. Sinuses/Orbits: No acute finding. Other: Mastoid air cells and middle ear cavities are clear. IMPRESSION: 1. No acute intracranial hemorrhage or infarct. 2. Interval development of mild  ventriculomegaly, possibly reflecting changes of asymmetric central atrophy or communicating hydrocephalus. Electronically Signed   By: Helyn Numbers M.D.   On: 11/13/2022 17:58   DG Chest Port 1 View  Result Date: 11/13/2022 CLINICAL DATA:  Chest pain EXAM: PORTABLE CHEST 1 VIEW COMPARISON:  Chest x-ray 08/01/2019 FINDINGS: The heart size and mediastinal contours are within normal limits. Both lungs are clear. The visualized skeletal structures are unremarkable. IMPRESSION: No active disease. Electronically Signed   By: Darliss Cheney M.D.   On: 11/13/2022 17:37    Pending Labs Unresulted Labs (From admission, onward)     Start     Ordered   11/13/22 1656  Urinalysis, w/ Reflex to Culture (Infection Suspected) -Urine, Clean Catch  Once,   URGENT       Question:  Specimen Source  Answer:  Urine, Clean Catch   11/13/22 1656            Vitals/Pain Today's Vitals   11/13/22 1715 11/13/22 2014 11/13/22 2015 11/13/22 2024  BP: (!) 157/67  (!) 179/71   Pulse: 70  79   Resp: 16  18   Temp:    98.2 F (36.8 C)  TempSrc:    Oral  SpO2: 95%  97%   PainSc:  0-No pain      Isolation Precautions No active isolations  Medications Medications  0.9 %  sodium chloride infusion (Manually program via Guardrails IV Fluids) (has no administration in time range)  morphine (PF) 2 MG/ML injection 2 mg (2 mg Intravenous Given 11/13/22 1907)  ondansetron (ZOFRAN) injection 4 mg (4 mg Intravenous Given 11/13/22 1907)  pantoprazole (PROTONIX) injection 40 mg (40 mg Intravenous Given 11/13/22 1907)    Mobility walks with device

## 2022-11-13 NOTE — ED Triage Notes (Signed)
Pt presents with multiple complaints. Pt reports left leg swelling, an ulcer diagnosed by his MD, abnormal vision, and chest pains over the last few days, although he is not currently having chest pain.

## 2022-11-13 NOTE — H&P (Signed)
History and Physical    [name] ZOX:[id number] DOB: 1933-06-20 DOA: 11/13/2022  PCP: [name], NP  Patient coming from: Home  I have personally briefly reviewed patient's old medical records in [facility]  Chief Complaint: Multiple complaints but mainly chest/epigastric pain  HPI: [name] is a 89 y.o. male with medical history significant of recent diagnosis of adenocarcinoma of stomach with the plan to start Keytruda in few weeks, BPH, hypertension, GERD, hyperlipidemia presented to ED with multiple complaints but mainly chest pain.  Patient states that he has chest pain which is intermittent since about a week.  However he is pointing towards his epigastric area.  The pain is dull, no aggravating or relieving factor, 6-7 out of 10 with no radiation.  He denies any nausea, diaphoresis, shortness of breath or any other complaint.  He further adds that he has issue with his vision in the right eye but his left eye is also losing vision but then he adds that this is going on for a long time.  He further tells me that he has edema in the left leg however on my examination, he does not appear to have any edema at all.   ED Course: Upon arrival to ED, patient fairly hemodynamically stable.  Mild hypokalemia with 3.1.  Hemoglobin 6.5.  Troponin 165.  FOBT positive.  Dr. Concha Se of GI consulted.  Hospitalist were called for admission for GI bleed and elevated troponin.  Review of Systems: As per HPI otherwise negative.    Past Medical History:  Diagnosis Date   Anxiety    Cataract    Depression    Diabetes mellitus    GERD (gastroesophageal reflux disease)    Hyperlipemia    Hypertension     Past Surgical History:  Procedure Laterality Date   CHOLECYSTECTOMY N/A 07/30/2019   Procedure: LAPAROSCOPIC CHOLECYSTECTOMY;  Surgeon: [name], [name], MD;  Location: [facility];  Service: General;  Laterality: N/A;   FRACTURE SURGERY     HERNIA REPAIR        reports that he has never smoked. He has never used smokeless tobacco. He reports that he does not drink alcohol and does not use drugs.  No Known Allergies  Family History  Problem Relation Age of Onset   Lung cancer Brother    Colon cancer Neg Hx    Esophageal cancer Neg Hx    Rectal cancer Neg Hx    Stomach cancer Neg Hx     Prior to Admission medications   Medication Sig Start Date End Date Taking? Authorizing Provider  amLODipine (NORVASC) 10 MG tablet Take 10 mg by mouth daily.  07/08/19   [provider]  buPROPion (WELLBUTRIN XL) 300 MG 24 hr tablet Take 300 mg by mouth daily. 11/14/16   [provider]  ferrous sulfate 325 (65 FE) MG EC tablet Take 1 tablet (325 mg total) by mouth daily with breakfast. 10/24/22   Heilingoetter, [name], [name]  gabapentin (NEURONTIN) 800 MG tablet Take 800 mg by mouth 2 (two) times daily. 06/19/22   [provider]  L-Methylfolate-Algae-B12-B6 (METANX PO) Take 1 capsule by mouth 2 (two) times daily.    [provider]  LORazepam (ATIVAN) 0.5 MG tablet Take 0.5 mg by mouth 2 (two) times daily.     [provider]  Multiple Vitamin (MULITIVITAMIN WITH MINERALS) TABS Take 1 tablet by mouth daily.    [provider]  Omega-3 Fatty Acids (FISH OIL) 1000  MG CAPS Take by mouth.    [provider]  omeprazole (PRILOSEC) 40 MG capsule Take 1 capsule (40 mg total) by mouth in the morning and at bedtime. 09/30/22   Charlie Pitter III, MD  potassium chloride SA (KLOR-CON M) 20 MEQ tablet Take 1 tablet (20 mEq total) by mouth daily. 10/24/22   Heilingoetter, [name], [name]  primidone (MYSOLINE) 50 MG tablet Take 50 mg by mouth 2 (two) times daily.    [provider]  tamsulosin (FLOMAX) 0.4 MG CAPS capsule Take 0.8 mg by mouth daily after supper.    [provider]    Physical Exam: Vitals:   11/13/22 1715 11/13/22 2015 11/13/22 2024 11/13/22 2130  BP: (!) 157/67 (!)  179/71  (!) 143/78  Pulse: 70 79  79  Resp: 16 18  19   Temp:   98.2 F (36.8 C)   TempSrc:   Oral   SpO2: 95% 97%  98%    Constitutional: NAD, calm, comfortable Vitals:   11/13/22 1715 11/13/22 2015 11/13/22 2024 11/13/22 2130  BP: (!) 157/67 (!) 179/71  (!) 143/78  Pulse: 70 79  79  Resp: 16 18  19   Temp:   98.2 F (36.8 C)   TempSrc:   Oral   SpO2: 95% 97%  98%   Eyes: PERRL, lids and conjunctivae normal ENMT: Mucous membranes are moist. Posterior pharynx clear of any exudate or lesions.Normal dentition.  Neck: normal, supple, no masses, no thyromegaly Respiratory: clear to auscultation bilaterally, no wheezing, no crackles. Normal respiratory effort. No accessory muscle use.  Cardiovascular: Regular rate and rhythm, no murmurs / rubs / gallops. No extremity edema. 2+ pedal pulses. No carotid bruits.  Abdomen: Mild epigastric tenderness, no masses palpated. No hepatosplenomegaly. Bowel sounds positive.  Musculoskeletal: no clubbing / cyanosis. No joint deformity upper and lower extremities. Good ROM, no contractures. Normal muscle tone.  Skin: no rashes, lesions, ulcers. No induration Neurologic: CN 2-12 grossly intact. Sensation intact, DTR normal. Strength 5/5 in all 4.  Psychiatric: Normal judgment and insight. Alert and oriented x 3. Normal mood.   Labs on Admission: I have personally reviewed following labs and imaging studies  CBC: Recent Labs  Lab 11/13/22 1750  WBC 5.2  NEUTROABS 3.6  HGB 6.5*  HCT 21.3*  MCV 97.3  PLT 287   Basic Metabolic Panel: Recent Labs  Lab 11/13/22 1750  NA 134*  K 3.1*  CL 96*  CO2 30  GLUCOSE 181*  BUN 34*  CREATININE 1.13  CALCIUM 8.7*   GFR: CrCl cannot be calculated (Unknown ideal weight.). Liver Function Tests: Recent Labs  Lab 11/13/22 1750  AST 20  ALT 21  ALKPHOS 50  BILITOT 0.4  PROT 5.4*  ALBUMIN 2.9*   Recent Labs  Lab 11/13/22 1750  LIPASE 35   No results for input(s): "AMMONIA" in the last 168  hours. Coagulation Profile: No results for input(s): "INR", "PROTIME" in the last 168 hours. Cardiac Enzymes: No results for input(s): "CKTOTAL", "CKMB", "CKMBINDEX", "TROPONINI" in the last 168 hours. BNP (last 3 results) No results for input(s): "PROBNP" in the last 8760 hours. HbA1C: No results for input(s): "HGBA1C" in the last 72 hours. CBG: No results for input(s): "GLUCAP" in the last 168 hours. Lipid Profile: No results for input(s): "CHOL", "HDL", "LDLCALC", "TRIG", "CHOLHDL", "LDLDIRECT" in the last 72 hours. Thyroid Function Tests: Recent Labs    11/13/22 1750  TSH 1.380   Anemia Panel: No results for input(s): "VITAMINB12", "FOLATE", "  FERRITIN", "TIBC", "IRON", "RETICCTPCT" in the last 72 hours. Urine analysis:    Component Value Date/Time   COLORURINE YELLOW 08/13/2022 1901   APPEARANCEUR CLEAR 08/13/2022 1901   LABSPEC 1.010 08/13/2022 1901   PHURINE 7.5 08/13/2022 1901   GLUCOSEU NEGATIVE 08/13/2022 1901   HGBUR NEGATIVE 08/13/2022 1901   BILIRUBINUR NEGATIVE 08/13/2022 1901   BILIRUBINUR neg 08/04/2011 2026   KETONESUR NEGATIVE 08/13/2022 1901   PROTEINUR NEGATIVE 08/13/2022 1901   UROBILINOGEN 0.2 03/29/2012 1438   NITRITE NEGATIVE 08/13/2022 1901   LEUKOCYTESUR NEGATIVE 08/13/2022 1901    Radiological Exams on Admission: CT Head Wo Contrast  Result Date: 11/13/2022 CLINICAL DATA:  Neuro deficit, acute, stroke suspected, vision change EXAM: CT HEAD WITHOUT CONTRAST TECHNIQUE: Contiguous axial images were obtained from the base of the skull through the vertex without intravenous contrast. RADIATION DOSE REDUCTION: This exam was performed according to the departmental dose-optimization program which includes automated exposure control, adjustment of the mA and/or kV according to patient size and/or use of iterative reconstruction technique. COMPARISON:  10/18/2004 FINDINGS: Brain: Mild parenchymal volume loss is present, commensurate with the patient's age and  stable since prior examination. Mild ventriculomegaly has developed, slightly disproportionate to the degree of parenchymal volume loss, possibly reflecting changes of asymmetric central atrophy or communicating hydrocephalus. No acute intracranial hemorrhage or infarct. No abnormal mass effect or midline shift. No abnormal intra or extra-axial mass lesion or fluid collection. Cerebellum is unremarkable. Vascular: No hyperdense vessel or unexpected calcification. Skull: Normal. Negative for fracture or focal lesion. Sinuses/Orbits: No acute finding. Other: Mastoid air cells and middle ear cavities are clear. IMPRESSION: 1. No acute intracranial hemorrhage or infarct. 2. Interval development of mild ventriculomegaly, possibly reflecting changes of asymmetric central atrophy or communicating hydrocephalus. Electronically Signed   By: Helyn Numbers M.D.   On: 11/13/2022 17:58   DG Chest Port 1 View  Result Date: 11/13/2022 CLINICAL DATA:  Chest pain EXAM: PORTABLE CHEST 1 VIEW COMPARISON:  Chest x-ray 08/01/2019 FINDINGS: The heart size and mediastinal contours are within normal limits. Both lungs are clear. The visualized skeletal structures are unremarkable. IMPRESSION: No active disease. Electronically Signed   By: Darliss Cheney M.D.   On: 11/13/2022 17:37    EKG: Independently reviewed.  Sinus rhythm with no acute ST-T wave changes  Assessment/Plan Principal Problem:   GIB (gastrointestinal bleeding) Active Problems:   Essential hypertension, benign   Gastric ulcer without hemorrhage or perforation   Gastric cancer (HCC)   Chest pain   ABLA (acute blood loss anemia)   Chest/epigastric pain: Likely epigastric pain secondary to new gastric cancer or perhaps presumed gastric ulcer.  Slightly elevated troponin which is likely demand ischemia however we will follow 2 sets of troponins and monitor on telemetry and obtain 2D echo in the morning.  Upper GI bleed/acute blood loss anemia: FOBT positive.   Hemoglobin 6.5.  2 units of PRBC transfusion has been ordered by ED.  GI consulted.  Will benefit from EGD.  Will keep him n.p.o. from midnight.  He received Protonix in the ED, will resume that as twice daily.  Repeat H&H posttransfusion.  Hypertension: Resumed home medications.  BPH: Resume Flomax.  Hypokalemia: Will replace.  DVT prophylaxis: SCDs Start: 11/13/22 2113 Code Status: Full code Family Communication: None present at bedside.  Plan of care discussed with patient in length and he verbalized understanding and agreed with it. Disposition Plan: Likely discharge in 2 to 3 days Consults called: GI.  [name] MD [facility]  *Please note that this is a verbal dictation therefore any spelling or grammatical errors are due to the "[employer] One" system interpretation.  Please page via Amion and do not message via secure chat for urgent patient care matters. Secure chat can be used for non urgent patient care matters. 11/13/2022, 9:36 PM  To contact the attending provider between 7A-7P or the covering provider during after hours 7P-7A, please log into the web site [email or web address]

## 2022-11-13 NOTE — Telephone Encounter (Signed)
Contacted patient to scheduled appointments. Left message with appointment details and a call back number if patient had any questions or could not accommodate the time we provided.   

## 2022-11-13 NOTE — ED Provider Notes (Signed)
[place] EMERGENCY DEPARTMENT AT [facility] Provider Note   CSN: [id number] Arrival date & time: 11/13/22  1618     History  Chief Complaint  Patient presents with   Multiple Complaints     Jaheir Paisley is a 89 y.o. male.  Patient is a pleasant 89 year old male with a history of adenocarcinoma of his stomach which was recently diagnosed earlier this year who is planning on starting Keytruda in a few weeks but is not currently on any therapy, BPH, hypertension who is presenting today from his PCP office with multiple complaints.  Patient reports that this week he has had multiple episodes of chest pain.  They typically last 5 to 10 minutes and are very uncomfortable.  He points that it is in his epigastric and substernal area when it occurs.  Sometimes it occurs after eating sometimes it is when he is laying in bed sometimes when he is watching TV.  He has had 5 episodes total this week.  He has never had this prior to this week.  He denies it making him feel short of breath or nausea or vomiting.  He is currently eating a very bland diet because he had his teeth extracted but reports he still eating and drinking.  He has been compliant with his medications and had recently started Prilosec.  Last bowel movement was yesterday and unchanged.  He is denying any urinary issues at this time.  Other complaints include feeling that his balance is off, vision has been gradually getting worse and he noticed swelling in his leg.  He reports the vision and leg issues are not new they have just been gradually worsening.  The history is provided by the patient.       Home Medications Prior to Admission medications   Medication Sig Start Date End Date Taking? Authorizing Provider  amLODipine (NORVASC) 10 MG tablet Take 10 mg by mouth daily.  07/08/19   [provider]  buPROPion (WELLBUTRIN XL) 300 MG 24 hr tablet Take 300 mg by mouth daily. 11/14/16   [provider]  ferrous sulfate 325 (65 FE) MG EC tablet Take 1 tablet (325 mg total) by mouth daily with breakfast. 10/24/22   Heilingoetter, [name], [name]  gabapentin (NEURONTIN) 800 MG tablet Take 800 mg by mouth 2 (two) times daily. 06/19/22   [provider]  L-Methylfolate-Algae-B12-B6 (METANX PO) Take 1 capsule by mouth 2 (two) times daily.    [provider]  LORazepam (ATIVAN) 0.5 MG tablet Take 0.5 mg by mouth 2 (two) times daily.     [provider]  Multiple Vitamin (MULITIVITAMIN WITH MINERALS) TABS Take 1 tablet by mouth daily.    [provider]  Omega-3 Fatty Acids (FISH OIL) 1000 MG CAPS Take by mouth.    [provider]  omeprazole (PRILOSEC) 40 MG capsule Take 1 capsule (40 mg total) by mouth in the morning and at bedtime. 09/30/22   Charlie Pitter III, MD  potassium chloride SA (KLOR-CON M) 20 MEQ tablet Take 1 tablet (20 mEq total) by mouth daily. 10/24/22   Heilingoetter, [name], [name]  primidone (MYSOLINE) 50 MG tablet Take 50 mg by mouth 2 (two) times daily.    [provider]  tamsulosin (FLOMAX) 0.4 MG CAPS capsule Take 0.8 mg by mouth daily after supper.    [provider]      Allergies    Patient has no known allergies.    Review  of Systems   Review of Systems  Physical Exam Updated Vital Signs BP (!) 179/71   Pulse 79   Temp 98.2 F (36.8 C) (Oral)   Resp 18   SpO2 97%  Physical Exam Vitals and nursing note reviewed.  Constitutional:      General: He is not in acute distress.    Appearance: He is well-developed.  HENT:     Head: Normocephalic and atraumatic.     Mouth/Throat:     Mouth: Mucous membranes are moist.  Eyes:     Conjunctiva/sclera: Conjunctivae normal.     Pupils: Pupils are equal, round, and reactive to light.  Cardiovascular:     Rate and Rhythm: Normal rate and regular rhythm.     Heart sounds: No murmur heard. Pulmonary:     Effort: Pulmonary effort is normal.  No respiratory distress.     Breath sounds: Normal breath sounds. No wheezing or rales.  Abdominal:     General: There is no distension.     Palpations: Abdomen is soft.     Tenderness: There is abdominal tenderness. There is no guarding or rebound.     Comments: Mild diffuse abdominal tenderness with no rebound or guarding  Musculoskeletal:        General: No tenderness. Normal range of motion.     Cervical back: Normal range of motion and neck supple.     Right lower leg: Edema present.     Left lower leg: Edema present.     Comments: Trace pitting edema in bilateral ankles  Skin:    General: Skin is warm and dry.     Findings: No erythema or rash.  Neurological:     Mental Status: He is alert and oriented to person, place, and time. Mental status is at baseline.  Psychiatric:        Behavior: Behavior normal.     ED Results / Procedures / Treatments   Labs (all labs ordered are listed, but only abnormal results are displayed) Labs Reviewed  CBC WITH DIFFERENTIAL/PLATELET - Abnormal; Notable for the following components:      Result Value   RBC 2.19 (*)    Hemoglobin 6.5 (*)    HCT 21.3 (*)    RDW 16.8 (*)    All other components within normal limits  COMPREHENSIVE METABOLIC PANEL - Abnormal; Notable for the following components:   Sodium 134 (*)    Potassium 3.1 (*)    Chloride 96 (*)    Glucose, Bld 181 (*)    BUN 34 (*)    Calcium 8.7 (*)    Total Protein 5.4 (*)    Albumin 2.9 (*)    All other components within normal limits  POC OCCULT BLOOD, ED - Abnormal; Notable for the following components:   Fecal Occult Bld POSITIVE (*)    All other components within normal limits  TROPONIN I (HIGH SENSITIVITY) - Abnormal; Notable for the following components:   Troponin I (High Sensitivity) 165 (*)    All other components within normal limits  TSH  LIPASE, BLOOD  URINALYSIS, W/ REFLEX TO CULTURE (INFECTION SUSPECTED)  TYPE AND SCREEN  PREPARE RBC (CROSSMATCH)   ABO/RH    EKG EKG Interpretation  Date/Time:  Thursday Nov 13 2022 18:00:27 EDT Ventricular Rate:  70 PR Interval:  30 QRS Duration: 106 QT Interval:  430 QTC Calculation: 464 R Axis:   78 Text Interpretation: Sinus rhythm Short PR interval more pronounced  Nonspecific repol abnormality, inferior  leads compared to 11/17/16 Confirmed by Gwyneth Sprout ([id number]) on 11/13/2022 6:08:00 PM  Radiology CT Head Wo Contrast  Result Date: 11/13/2022 CLINICAL DATA:  Neuro deficit, acute, stroke suspected, vision change EXAM: CT HEAD WITHOUT CONTRAST TECHNIQUE: Contiguous axial images were obtained from the base of the skull through the vertex without intravenous contrast. RADIATION DOSE REDUCTION: This exam was performed according to the departmental dose-optimization program which includes automated exposure control, adjustment of the mA and/or kV according to patient size and/or use of iterative reconstruction technique. COMPARISON:  10/18/2004 FINDINGS: Brain: Mild parenchymal volume loss is present, commensurate with the patient's age and stable since prior examination. Mild ventriculomegaly has developed, slightly disproportionate to the degree of parenchymal volume loss, possibly reflecting changes of asymmetric central atrophy or communicating hydrocephalus. No acute intracranial hemorrhage or infarct. No abnormal mass effect or midline shift. No abnormal intra or extra-axial mass lesion or fluid collection. Cerebellum is unremarkable. Vascular: No hyperdense vessel or unexpected calcification. Skull: Normal. Negative for fracture or focal lesion. Sinuses/Orbits: No acute finding. Other: Mastoid air cells and middle ear cavities are clear. IMPRESSION: 1. No acute intracranial hemorrhage or infarct. 2. Interval development of mild ventriculomegaly, possibly reflecting changes of asymmetric central atrophy or communicating hydrocephalus. Electronically Signed   By: Helyn Numbers M.D.   On: 11/13/2022  17:58   DG Chest Port 1 View  Result Date: 11/13/2022 CLINICAL DATA:  Chest pain EXAM: PORTABLE CHEST 1 VIEW COMPARISON:  Chest x-ray 08/01/2019 FINDINGS: The heart size and mediastinal contours are within normal limits. Both lungs are clear. The visualized skeletal structures are unremarkable. IMPRESSION: No active disease. Electronically Signed   By: Darliss Cheney M.D.   On: 11/13/2022 17:37    Procedures Procedures    Medications Ordered in ED Medications  0.9 %  sodium chloride infusion (Manually program via Guardrails IV Fluids) (has no administration in time range)  morphine (PF) 2 MG/ML injection 2 mg (2 mg Intravenous Given 11/13/22 1907)  ondansetron (ZOFRAN) injection 4 mg (4 mg Intravenous Given 11/13/22 1907)  pantoprazole (PROTONIX) injection 40 mg (40 mg Intravenous Given 11/13/22 1907)    ED Course/ Medical Decision Making/ A&P                             Medical Decision Making Amount and/or Complexity of Data Reviewed External Data Reviewed: notes. Labs: ordered. Decision-making details documented in ED Course. Radiology: ordered and independent interpretation performed. Decision-making details documented in ED Course. ECG/medicine tests: ordered and independent interpretation performed. Decision-making details documented in ED Course.  Risk Prescription drug management. Parenteral controlled substances. Decision regarding hospitalization.   Pt with multiple medical problems and comorbidities and presenting today with a complaint that caries a high risk for morbidity and mortality.  Patient here today with various complaints.  His biggest complaint is cyst pain in his upper abdomen and lower chest that started this week.  Patient does have a known history of an adenocarcinoma of his antrum that has not currently received any treatment.  He has been taking Prilosec.  Concerned that the pain is cancer related versus cardiac in origin, lower suspicion for electrolyte  abnormalities or anemia.  Patient reports he was at his doctor's office before this and they sent him here due to his various complaints.  His blood sugar there was 170 which he reports is high for him.  He does talk about swelling in his leg as well as vision  changes but this has been going on over months and these are not acute he just reports they are getting worse.  The only new symptoms he is having is the pain.  He was supposed to start Keytruda in a few weeks.  On exam patient is in no acute distress but does have some mild abdominal discomfort.  Vital signs are reassuring.  No focal neurofindings however patient complaining of being off balance.  Imaging and blood work pending.  8:26 PM I interpreted patient's EKG and labs today.  EKG does have more pronounced ST depression anterior laterally which is much more pronounced than 11-25-16.  Hemoglobin today has dropped to 6.5 from 10.22 weeks ago.  His stool is black today and heme positive.  CMP shows hypokalemia at 3.1, normal renal function, normal LFTs and sodium levels, TSH within normal limits.  Troponin elevated at 165 today again concern for secondary cause related to the anemia.  Lipase is within normal limits.  I have independently visualized and interpreted pt's images today.  Head CT and chest x-ray without acute issues today.  Radiology does report interval development of mild ventriculomegaly possibly reflecting asymmetric central atrophy or communicating hydrocephalus. Suspect its most likely the cancer bleeding.  Concerned that patient's chest pain may be related to anemia.  These findings were discussed with the patient.  He was started on Protonix and will transfuse 2 units of blood.  Spoke with Dr. Leone Payor with [facility] they will see the patient in consult.  Patient will require admission today.  He is comfortable with this plan.  Will consult hospitalist for admission.  CRITICAL CARE Performed by: Asharia Lotter Total critical care  time: 30 minutes Critical care time was exclusive of separately billable procedures and treating other patients. Critical care was necessary to treat or prevent imminent or life-threatening deterioration. Critical care was time spent personally by me on the following activities: development of treatment plan with patient and/or surrogate as well as nursing, discussions with consultants, evaluation of patient's response to treatment, examination of patient, obtaining history from patient or surrogate, ordering and performing treatments and interventions, ordering and review of laboratory studies, ordering and review of radiographic studies, pulse oximetry and re-evaluation of patient's condition.          Final Clinical Impression(s) / ED Diagnoses Final diagnoses:  Chest pain, unspecified type  Symptomatic anemia  Acute upper GI bleeding    Rx / DC Orders ED Discharge Orders     None         Gwyneth Sprout, MD 11/13/22 25-Nov-2024

## 2022-11-14 ENCOUNTER — Inpatient Hospital Stay (HOSPITAL_COMMUNITY): Payer: Medicare HMO

## 2022-11-14 DIAGNOSIS — Z7189 Other specified counseling: Secondary | ICD-10-CM

## 2022-11-14 DIAGNOSIS — C169 Malignant neoplasm of stomach, unspecified: Secondary | ICD-10-CM | POA: Diagnosis not present

## 2022-11-14 DIAGNOSIS — D649 Anemia, unspecified: Secondary | ICD-10-CM

## 2022-11-14 DIAGNOSIS — I10 Essential (primary) hypertension: Secondary | ICD-10-CM | POA: Diagnosis not present

## 2022-11-14 DIAGNOSIS — R079 Chest pain, unspecified: Secondary | ICD-10-CM

## 2022-11-14 DIAGNOSIS — K259 Gastric ulcer, unspecified as acute or chronic, without hemorrhage or perforation: Secondary | ICD-10-CM | POA: Diagnosis not present

## 2022-11-14 DIAGNOSIS — R7989 Other specified abnormal findings of blood chemistry: Secondary | ICD-10-CM

## 2022-11-14 DIAGNOSIS — D62 Acute posthemorrhagic anemia: Secondary | ICD-10-CM | POA: Diagnosis not present

## 2022-11-14 DIAGNOSIS — Z515 Encounter for palliative care: Secondary | ICD-10-CM

## 2022-11-14 DIAGNOSIS — K254 Chronic or unspecified gastric ulcer with hemorrhage: Secondary | ICD-10-CM | POA: Diagnosis not present

## 2022-11-14 LAB — CBC WITH DIFFERENTIAL/PLATELET
Abs Immature Granulocytes: 0.02 10*3/uL (ref 0.00–0.07)
Basophils Absolute: 0 10*3/uL (ref 0.0–0.1)
Basophils Relative: 0 %
Eosinophils Absolute: 0.1 10*3/uL (ref 0.0–0.5)
Eosinophils Relative: 2 %
HCT: 28.5 % — ABNORMAL LOW (ref 39.0–52.0)
Hemoglobin: 8.8 g/dL — ABNORMAL LOW (ref 13.0–17.0)
Immature Granulocytes: 0 %
Lymphocytes Relative: 17 %
Lymphs Abs: 1 10*3/uL (ref 0.7–4.0)
MCH: 28.8 pg (ref 26.0–34.0)
MCHC: 30.9 g/dL (ref 30.0–36.0)
MCV: 93.1 fL (ref 80.0–100.0)
Monocytes Absolute: 0.6 10*3/uL (ref 0.1–1.0)
Monocytes Relative: 11 %
Neutro Abs: 3.8 10*3/uL (ref 1.7–7.7)
Neutrophils Relative %: 70 %
Platelets: 281 10*3/uL (ref 150–400)
RBC: 3.06 MIL/uL — ABNORMAL LOW (ref 4.22–5.81)
RDW: 18.7 % — ABNORMAL HIGH (ref 11.5–15.5)
WBC: 5.5 10*3/uL (ref 4.0–10.5)
nRBC: 0 % (ref 0.0–0.2)

## 2022-11-14 LAB — ECHOCARDIOGRAM COMPLETE
AR max vel: 2.42 cm2
AV Area VTI: 2.25 cm2
AV Area mean vel: 2.32 cm2
AV Mean grad: 4 mmHg
AV Peak grad: 8.3 mmHg
Ao pk vel: 1.44 m/s
Area-P 1/2: 3.3 cm2
Calc EF: 59.8 %
S' Lateral: 3.3 cm
Single Plane A2C EF: 55 %
Single Plane A4C EF: 64 %

## 2022-11-14 LAB — HEMOGLOBIN A1C
Hgb A1c MFr Bld: 4.8 % (ref 4.8–5.6)
Mean Plasma Glucose: 91.06 mg/dL

## 2022-11-14 LAB — BASIC METABOLIC PANEL
Anion gap: 10 (ref 5–15)
BUN: 24 mg/dL — ABNORMAL HIGH (ref 8–23)
CO2: 31 mmol/L (ref 22–32)
Calcium: 8.6 mg/dL — ABNORMAL LOW (ref 8.9–10.3)
Chloride: 96 mmol/L — ABNORMAL LOW (ref 98–111)
Creatinine, Ser: 1.02 mg/dL (ref 0.61–1.24)
GFR, Estimated: >60 mL/min (ref >60)
Glucose, Bld: 140 mg/dL — ABNORMAL HIGH (ref 70–99)
Potassium: 3.3 mmol/L — ABNORMAL LOW (ref 3.5–5.1)
Sodium: 137 mmol/L (ref 135–145)

## 2022-11-14 LAB — BPAM RBC
Blood Product Expiration Date: 202406132359
Blood Product Expiration Date: 202406132359

## 2022-11-14 LAB — MAGNESIUM: Magnesium: 1.7 mg/dL (ref 1.7–2.4)

## 2022-11-14 LAB — TYPE AND SCREEN: ABO/RH(D): A POS

## 2022-11-14 LAB — HEMOGLOBIN AND HEMATOCRIT, BLOOD
HCT: 28 % — ABNORMAL LOW (ref 39.0–52.0)
Hemoglobin: 9 g/dL — ABNORMAL LOW (ref 13.0–17.0)

## 2022-11-14 LAB — PHOSPHORUS: Phosphorus: 3.5 mg/dL (ref 2.5–4.6)

## 2022-11-14 LAB — TROPONIN I (HIGH SENSITIVITY)
Troponin I (High Sensitivity): 389 ng/L (ref <18)
Troponin I (High Sensitivity): 556 ng/L (ref <18)

## 2022-11-14 MED ORDER — PANTOPRAZOLE SODIUM 40 MG PO TBEC
40.0000 mg | DELAYED_RELEASE_TABLET | Freq: Two times a day (BID) | ORAL | Status: DC
  Administered 2022-11-14 – 2022-11-17 (×6): 40 mg via ORAL
  Filled 2022-11-14 (×6): qty 1

## 2022-11-14 MED ORDER — ALUM & MAG HYDROXIDE-SIMETH 200-200-20 MG/5ML PO SUSP
15.0000 mL | ORAL | Status: DC | PRN
  Administered 2022-11-15 – 2022-11-16 (×4): 15 mL via ORAL
  Filled 2022-11-14 (×4): qty 30

## 2022-11-14 MED ORDER — ORAL CARE MOUTH RINSE: 15.0000 mL | OROMUCOSAL | Status: DC | PRN

## 2022-11-14 MED ORDER — POTASSIUM CHLORIDE CRYS ER 20 MEQ PO TBCR
40.0000 meq | EXTENDED_RELEASE_TABLET | Freq: Once | ORAL | Status: AC
  Administered 2022-11-14: 40 meq via ORAL
  Filled 2022-11-14: qty 2

## 2022-11-14 NOTE — TOC Progression Note (Signed)
Transition of Care [facility]) - Progression Note    Patient Details  Name: [name] MRN: [id number] Date of Birth: [birth year]  Transition of Care [facility]) CM/SW Contact  Beckie Busing, RN Phone Number:[phone]  11/14/2022, 3:15 PM  Clinical Narrative:     Transition of Care [facility]) Screening Note   Patient Details  Name: [name] Date of Birth: 07/10/1932   Transition of Care [facility]) CM/SW Contact:    Beckie Busing, RN Phone [phone]  11/14/2022, 3:16 PM    Transition of Care Department [facility]) has reviewed patient and no TOC needs have been identified at this time. We will continue to monitor patient advancement through interdisciplinary progression rounds. If new patient transition needs arise, please place a TOC consult.          Expected Discharge Plan and Services                                               Social Determinants of Health (SDOH) Interventions SDOH Screenings   Food Insecurity: No Food Insecurity (10/10/2022)  Housing: Low Risk  (10/10/2022)  Transportation Needs: No Transportation Needs (10/10/2022)  Utilities: Not At Risk (10/10/2022)  Depression (PHQ2-9): Low Risk  (10/10/2022)  Tobacco Use: Low Risk  (11/13/2022)    Readmission Risk Interventions     No data to display

## 2022-11-14 NOTE — Progress Notes (Signed)
Mobility Specialist - Progress Note   11/14/22 1219  Mobility  Activity Ambulated with assistance in hallway  Level of Assistance Modified independent, requires aide device or extra time  Assistive Device Other (Comment) (IV Pole)  Distance Ambulated (ft) 250 ft  Activity Response Tolerated well  Mobility Referral Yes  $Mobility charge 1 Mobility  Mobility Specialist Stop Time (ACUTE ONLY) 1201   Pt received in bed and agreeable to mobility. No complaints during session. Pt to bed after session with all needs met & MD in room.   [facility]

## 2022-11-14 NOTE — Consult Note (Addendum)
Cardiology Consultation   Patient ID: [name] MRN: [id number]; DOB: 31-Jul-1932  Admit date: 11/13/2022 Date of Consult: 11/14/2022  PCP:  [name], NP   [facility] Providers Cardiologist:  None   {  Patient Profile:   [name] is a 89 y.o. 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 Jacqulyn Bath.  History of Present Illness:   [name] is evaluated by Dr. Gery Pray in 2018 for bradycardia.  He had recently been started on propranolol by PCP which resulted in symptomatic bradycardia.  This was discontinued and his heart rate went back to 90s.  Recommended patient avoid beta-blockers in the future.  Patient presented to the ER at [facility] 11/13/2022 with chest pain and epigastric pain.  The patient reported intermittent chest pain for about a week.  He denied any nausea, diaphoresis, shortness of breath. Says pain was a sharp pain and would last for 5 minutes at a time. He reports dark stools for a couple weeks. Also noted swelling on his feet.   In the ER hemoglobin 6.5.  Patient was given 2 units PRBCs.  Postinfusion hemoglobin was 9.  High-sensitivity troponin 165.  FOBT positive.  GI was consulted.  Vitals showed blood pressure 179/71, pulse 79, afebrile, respiratory rate 18, normal O2.  Labs showed albumin 2.9, sodium 134, potassium 3.1. EKG showed NSR, 70bpm, with minimal ST dep inferolateral leads. CXR negative. CT head negative.  High-sensitivity troponin went up to 556. No IV heparin was started due to GIB.   Patient was diagnosed with stomach cancer earlier this year and refused surgical treatment, radiation or chemotherapy.  Past Medical History:  Diagnosis Date   Anxiety    Cataract    Depression    Diabetes mellitus    GERD (gastroesophageal reflux disease)    Hyperlipemia     Hypertension     Past Surgical History:  Procedure Laterality Date   CHOLECYSTECTOMY N/A 07/30/2019   Procedure: LAPAROSCOPIC CHOLECYSTECTOMY;  Surgeon: [name], [name], MD;  Location: [facility];  Service: General;  Laterality: N/A;   FRACTURE SURGERY     HERNIA REPAIR       Home Medications:  Prior to Admission medications   Medication Sig Start Date End Date Taking? Authorizing Provider  acetaminophen (TYLENOL) 500 MG tablet Take 500 mg by mouth every 6 (six) hours as needed for moderate pain or mild pain.   Yes [provider]  amLODipine (NORVASC) 10 MG tablet Take 10 mg by mouth daily.  07/08/19  Yes [provider]  buPROPion (WELLBUTRIN XL) 300 MG 24 hr tablet Take 300 mg by mouth daily. 11/14/16  Yes [provider]  chlorhexidine (PERIDEX) 0.12 % solution Use as directed 15 mLs in the mouth or throat 2 (two) times daily.   Yes [provider]  ferrous sulfate 325 (65 FE) MG EC tablet Take 1 tablet (325 mg total) by mouth daily with breakfast. 10/24/22  Yes Heilingoetter, [name], [name]  gabapentin (NEURONTIN) 800 MG tablet Take 800 mg by mouth 2 (two) times daily. 06/19/22  Yes [provider]  glipiZIDE (GLUCOTROL XL) 2.5 MG 24 hr tablet Take 2.5 mg by mouth daily with breakfast.   Yes [provider]  hydrochlorothiazide (HYDRODIURIL) 25 MG tablet Take 25 mg by mouth daily.   Yes [provider]  loratadine (  CLARITIN) 10 MG tablet Take 10 mg by mouth daily.   Yes [provider]  LORazepam (ATIVAN) 0.5 MG tablet Take 0.5-1 mg by mouth at bedtime. Take 0.5 mg (1 tablet) by mouth every night at bedtime. May take an additional 0.5 mg (1 tablet) as needed.   Yes [provider]  Multiple Vitamin (MULITIVITAMIN WITH MINERALS) TABS Take 1 tablet by mouth daily.   Yes [provider]  Omega-3 Fatty Acids (FISH OIL) 1000 MG CAPS Take 1,000 mg by mouth daily.   Yes [provider]   omeprazole (PRILOSEC) 40 MG capsule Take 1 capsule (40 mg total) by mouth in the morning and at bedtime. Patient taking differently: Take 40 mg by mouth daily. 09/30/22  Yes Danis, Andreas Blower, MD  primidone (MYSOLINE) 50 MG tablet Take 50 mg by mouth 2 (two) times daily.   Yes [provider]  tamsulosin (FLOMAX) 0.4 MG CAPS capsule Take 0.8 mg by mouth daily after supper.   Yes [provider]  temazepam (RESTORIL) 30 MG capsule Take 30 mg by mouth at bedtime.   Yes [provider]  potassium chloride SA (KLOR-CON M) 20 MEQ tablet Take 1 tablet (20 mEq total) by mouth daily. Patient not taking: Reported on 11/14/2022 10/24/22   Heilingoetter, [name], [name]    Inpatient Medications: Scheduled Meds:  sodium chloride   Intravenous Once   amLODipine  10 mg Oral Daily   buPROPion  300 mg Oral Daily   ferrous sulfate  325 mg Oral Q breakfast   gabapentin  800 mg Oral BID   LORazepam  0.5 mg Oral BID   pantoprazole (PROTONIX) IV  40 mg Intravenous Q12H   primidone  50 mg Oral BID   tamsulosin  0.8 mg Oral QPC supper   Continuous Infusions:  sodium chloride 75 mL/hr at 11/14/22 0551   PRN Meds: acetaminophen, alum & mag hydroxide-simeth, nitroGLYCERIN, ondansetron (ZOFRAN) IV, mouth rinse  Allergies:   No Known Allergies  Social History:   Social History   Socioeconomic History   Marital status: Widowed    Spouse name: Not on file   Number of children: 2   Years of education: Not on file   Highest education level: Doctorate  Occupational History   Occupation: retired Actuary professor    Employer: [employer]  Tobacco Use   Smoking status: Never   Smokeless tobacco: Never  Vaping Use   Vaping Use: Never used  Substance and Sexual Activity   Alcohol use: No   Drug use: No   Sexual activity: Not on file  Other Topics Concern   Not on file  Social History Narrative   Lives in a senior center.  Has 2 children.  Retired Mudlogger at  [employer].  Education; PhD.   Social Determinants of Health   Financial Resource Strain: Not on file  Food Insecurity: No Food Insecurity (10/10/2022)   Hunger Vital Sign    Worried About Running Out of Food in the Last Year: Never true    Ran Out of Food in the Last Year: Never true  Transportation Needs: No Transportation Needs (10/10/2022)   PRAPARE - Administrator, Civil Service (Medical): No    Lack of Transportation (Non-Medical): No  Physical Activity: Not on file  Stress: Not on file  Social Connections: Not on file  Intimate Partner Violence: Patient Declined (10/10/2022)   Humiliation, Afraid, Rape, and Kick questionnaire    Fear of Current  or Ex-Partner: Patient declined    Emotionally Abused: Patient declined    Physically Abused: Patient declined    Sexually Abused: Patient declined    Family History:    Family History  Problem Relation Age of Onset   Lung cancer Brother    Colon cancer Neg Hx    Esophageal cancer Neg Hx    Rectal cancer Neg Hx    Stomach cancer Neg Hx      ROS:  Please see the history of present illness.   All other ROS reviewed and negative.     Physical Exam/Data:   Vitals:   11/14/22 0543 11/14/22 0544 11/14/22 0624 11/14/22 1017  BP: (!) 154/60 (!) 154/60 (!) 147/67 (!) 172/68  Pulse: (!) 53 (!) 53 (!) 54 60  Resp: 16 16 18    Temp: 97.8 F (36.6 C) 97.8 F (36.6 C) 98 F (36.7 C) 97.6 F (36.4 C)  TempSrc: Oral Oral Oral Oral  SpO2: 100% 100% 92% 91%    Intake/Output Summary (Last 24 hours) at 11/14/2022 1026 Last data filed at 11/14/2022 0900 Gross per 24 hour  Intake 961.96 ml  Output 400 ml  Net 561.96 ml      10/10/2022   10:55 AM 09/30/2022   11:20 AM 09/18/2022    8:44 AM  Last 3 Weights  Weight (lbs) 186 lb 187 lb 187 lb 4 oz  Weight (kg) 84.369 kg 84.823 kg 84.936 kg     There is no height or weight on file to calculate BMI.  General:  Well nourished, well developed, in no acute distress HEENT:  normal Neck: no JVD Vascular: No carotid bruits; Distal pulses 2+ bilaterally Cardiac:  normal S1, S2; RRR; no murmur  Lungs:  clear to auscultation bilaterally, no wheezing, rhonchi or rales  Abd: soft, nontender, no hepatomegaly  Ext: mild pedal edema Musculoskeletal:  No deformities, BUE and BLE strength normal and equal Skin: warm and dry  Neuro:  CNs 2-12 intact, no focal abnormalities noted Psych:  Normal affect   EKG:  The EKG was personally reviewed and demonstrates:  NSR, 70bpm, minimal ST depression inf/lat leads Telemetry:  Telemetry was personally reviewed and demonstrates:  SB HR 50s into 30-40s, rare pauses around 2 seconds  Relevant CV Studies:  Echo ordered  Laboratory Data:  High Sensitivity Troponin:   Recent Labs  Lab 11/13/22 1750 11/13/22 2142 11/13/22 2315 11/14/22 0737 11/14/22 0911  TROPONINIHS 165* 138* 130* 389* 556*     Chemistry Recent Labs  Lab 11/13/22 1750 11/14/22 0737  NA 134* 137  K 3.1* 3.3*  CL 96* 96*  CO2 30 31  GLUCOSE 181* 140*  BUN 34* 24*  CREATININE 1.13 1.02  CALCIUM 8.7* 8.6*  MG  --  1.7  GFRNONAA >60 >60  ANIONGAP 8 10    Recent Labs  Lab 11/13/22 1750  PROT 5.4*  ALBUMIN 2.9*  AST 20  ALT 21  ALKPHOS 50  BILITOT 0.4   Lipids No results for input(s): "CHOL", "TRIG", "HDL", "LABVLDL", "LDLCALC", "CHOLHDL" in the last 168 hours.  Hematology Recent Labs  Lab 11/13/22 1750 11/14/22 0737  WBC 5.2  --   RBC 2.19*  --   HGB 6.5* 9.0*  HCT 21.3* 28.0*  MCV 97.3  --   MCH 29.7  --   MCHC 30.5  --   RDW 16.8*  --   PLT 287  --    Thyroid  Recent Labs  Lab 11/13/22 1750  TSH 1.380  BNPNo results for input(s): "BNP", "PROBNP" in the last 168 hours.  DDimer No results for input(s): "DDIMER" in the last 168 hours.   Radiology/Studies:  CT Head Wo Contrast  Result Date: 11/13/2022 CLINICAL DATA:  Neuro deficit, acute, stroke suspected, vision change EXAM: CT HEAD WITHOUT CONTRAST TECHNIQUE:  Contiguous axial images were obtained from the base of the skull through the vertex without intravenous contrast. RADIATION DOSE REDUCTION: This exam was performed according to the departmental dose-optimization program which includes automated exposure control, adjustment of the mA and/or kV according to patient size and/or use of iterative reconstruction technique. COMPARISON:  10/18/2004 FINDINGS: Brain: Mild parenchymal volume loss is present, commensurate with the patient's age and stable since prior examination. Mild ventriculomegaly has developed, slightly disproportionate to the degree of parenchymal volume loss, possibly reflecting changes of asymmetric central atrophy or communicating hydrocephalus. No acute intracranial hemorrhage or infarct. No abnormal mass effect or midline shift. No abnormal intra or extra-axial mass lesion or fluid collection. Cerebellum is unremarkable. Vascular: No hyperdense vessel or unexpected calcification. Skull: Normal. Negative for fracture or focal lesion. Sinuses/Orbits: No acute finding. Other: Mastoid air cells and middle ear cavities are clear. IMPRESSION: 1. No acute intracranial hemorrhage or infarct. 2. Interval development of mild ventriculomegaly, possibly reflecting changes of asymmetric central atrophy or communicating hydrocephalus. Electronically Signed   By: Helyn Numbers M.D.   On: 11/13/2022 17:58   DG Chest Port 1 View  Result Date: 11/13/2022 CLINICAL DATA:  Chest pain EXAM: PORTABLE CHEST 1 VIEW COMPARISON:  Chest x-ray 08/01/2019 FINDINGS: The heart size and mediastinal contours are within normal limits. Both lungs are clear. The visualized skeletal structures are unremarkable. IMPRESSION: No active disease. Electronically Signed   By: Darliss Cheney M.D.   On: 11/13/2022 17:37     Assessment and Plan:   Chest pain NSTEMI - patient presented with chest pain/epigastric pain found to have Hgb 6.1 and elevated troponin - FOBT + and he was  administered 2 units PRBCs. 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  No wall motion abnormalities on echo.  Not a candidate for anticoagulation or invasive evaluation in setting of GI bleed.    GOC - palliative has been consulted to discuss DNR/DNI status  For questions or updates, please contact  [facility] Please consult [email or web address] for contact info under    Signed, Cadence David Stall, [name]  11/14/2022 10:26 AM   Patient seen and examined.  Agree with above documentation.  [name] is an 89 year old male with a history of stomach cancer, hypertension who were consulted by Dr. Jacqulyn Bath for evaluation of chest pain.  He presented with intermittent chest pain for past week.  Describes sharp pain that would last for about 5 minutes.  Chest pain yesterday lasted about an hour. Also reports having dark stools.  On presentation to ED, initial vital signs notable for BP 151/67, pulse 78, SpO2 96% on room air.  Labs notable for creatinine 1.13, hemoglobin 6.5, troponin 165 > 138 > 130 > 389 > 556.  He was seen by GI, felt to be GI bleed secondary to gastric cancer.  EGD was deferred as no effective  endoscopic treatment, would need surgery for which patient declines; supportive care recommended.  Echocardiogram shows EF 60 to 65%, normal RV function, no significant valvular disease.  EKG shows normal sinus rhythm, rate 70, nonspecific T wave flattening.  On exam, patient is alert and oriented, bradycardic, regular, no murmurs, lungs CTAB, trace LE edema.  Suspect presentation represents demand ischemia in setting of his acute anemia due to GI bleed secondary to gastric cancer.   He is not a candidate for invasive evaluation.  No further cardiac workup recommended.  Little Ishikawa, MD

## 2022-11-14 NOTE — Progress Notes (Addendum)
    Patient Name: [name]           DOB: February 09, 1933  MRN: [id number]      Admission Date: 11/13/2022  Attending Provider: [name], MD  Primary Diagnosis: GIB (gastrointestinal bleeding)   Level of care: Telemetry    CROSS COVER NOTE   Date of Service   11/14/2022   [name], 89 y.o. male, was admitted on 11/13/2022 for upper GIB (gastrointestinal bleeding), hemoglobin 6.5.   HPI/Events of Note   Chest Pain Patient reports 8/10 intermittent left chest pain. Described as sharp and pressure- like "someone sitting on top of chest."  Prior chest pain was dull. Pain is not reproducible on palpation.  Denies dyspnea, fatigue, palpitations, dizziness, diaphoresis, abdominal pain, strict reflux or heartburn, nausea, vomiting.  Orders placed for EKG, PRN nitroglycerin, labs: Troponin, BMP, magnesium, phosphorus Pain improved with sublingual nitroglycerin.  EKG interpreted as NSR without ST segment changes, frequent PVCs noted High-sensitivity troponin 165 --> 138 --> now 130   During bedside assessment, pt is A/O x 4, no acute distress, and hemodynamically stable.   Patient also endorses pain radiating to epigastric region.  Epigastric pain is likely secondary to recent diagnoses of adenocarcinoma of stomach or could be possible ulcer.  Will also order Maalox.  Nursing staff still waiting for blood to be prepared for transfusion.  Current hemoglobin 6.5, baseline hemoglobin these past 3 months ~12.   No active bleeding at this time, however transfusion should occur as soon as possible.   Interventions/ Plan   Above        Anthoney Harada, DNP, [facility] [place]

## 2022-11-14 NOTE — Progress Notes (Signed)
   11/14/22 1007  Provider Notification  Provider Name/Title Dr. Jacqulyn Bath  Date Provider Notified 11/14/22  Time Provider Notified 1007  Method of Notification Page  Notification Reason Critical Result  Test performed and critical result Troponin 556  Date Critical Result Received 11/14/22  Time Critical Result Received 1006  Provider response See new orders (cardiology consult)  Date of Provider Response 11/14/22  Time of Provider Response 1007

## 2022-11-14 NOTE — Progress Notes (Signed)
   11/14/22 1610  Provider Notification  Provider Name/Title Dr. Jacqulyn Bath  Date Provider Notified 11/14/22  Time Provider Notified [phone]  Method of Notification Page  Notification Reason Critical Result  Test performed and critical result Troponin 389  Date Critical Result Received 11/14/22  Time Critical Result Received 0830  Provider response No new orders  Date of Provider Response 11/14/22  Time of Provider Response [phone]

## 2022-11-14 NOTE — TOC Progression Note (Signed)
Transition of Care [facility]) - Progression Note    Patient Details  Name: [name] MRN: [id number] Date of Birth: Nov 16, 1932  Transition of Care [facility]) CM/SW Contact  Beckie Busing, RN Phone [phone]  11/14/2022, 3:17 PM  Clinical Narrative:     Transition of Care [facility]) Screening Note   Patient Details  Name: [name] Date of Birth: 02/18/1933   Transition of Care [facility]) CM/SW Contact:    Beckie Busing, RN Phone Number: 11/14/2022, 3:17 PM    Transition of Care Department [facility]) has reviewed patient and no TOC needs have been identified at this time. We will continue to monitor patient advancement through interdisciplinary progression rounds. If new patient transition needs arise, please place a TOC consult.           Expected Discharge Plan and Services                                               Social Determinants of Health (SDOH) Interventions SDOH Screenings   Food Insecurity: No Food Insecurity (10/10/2022)  Housing: Low Risk  (10/10/2022)  Transportation Needs: No Transportation Needs (10/10/2022)  Utilities: Not At Risk (10/10/2022)  Depression (PHQ2-9): Low Risk  (10/10/2022)  Tobacco Use: Low Risk  (11/13/2022)    Readmission Risk Interventions     No data to display

## 2022-11-14 NOTE — Consult Note (Addendum)
Consultation  Referring Provider:   [facility] Primary Care Physician:  [name], NP Primary Gastroenterologist:  Dr. Tomasa Rand       Reason for Consultation:     Acute on chronic anemia with associated chest/epigastric discomfort in setting of recently diagnosed invasive moderately differentiated gastric adenocarcinoma         HPI:   [name] is a 89 y.o. male with past medical history significant for hypertension, hyperlipidemia, type 2 diabetes, reflux, status postcholecystectomy 2021, hernia repair, recently diagnosed with invasive moderately differentiated gastric adenocarcinoma via endoscopy 09/30/2022 with Dr. Myrtie Neither.  Patient presented to ER 07/2022 with abdominal pain.  CT noted cavitation ulcer arising from the posterior gastric antrum at the pylorus.  09/30/2022 EGD with Dr. Myrtie Neither demonstrated nonbleeding cratered gastric ulcer with irregular and heaped edges measuring 20 x 25 mm.  Biopsy confirmed invasive moderately differentiated carcinoma.  Staging scans negative for distant metastasis.  Has been following with Dr. Mosetta Putt in oncology.  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  Presented to ER 5/16 with complaints of chest discomfort/epigastric discomfort intermittently for about a week.  Dull ache without radiation. In the ER patient HD stable.  Mild hypokalemia 3.1 FOBT positive, Hgb 6.5 approximately 1 month ago was 10.5, MCV 97 Leukocytosis, normal platelets BUN 34, previously 19, creatinine 1.13 previously 1.20. Troponin 165, currently 138 and 130. 10/10/2022 iron 106, ferritin 15, saturation ratios 34, TIBC 312. Unremarkable urine 11/13/2022 chest x-ray unremarkable  No family at bedside in the room. 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 Patient states he was doing well until the past 4 to 5 days began to have substernal chest discomfort that lasted 5 to 10 minutes until yesterday began to have prolonged epigastric/chest discomfort worse than before. Denies any associated chest pain, diaphoresis.  Chest pain was nonexertional. Discomfort has improved with 2 PRBC. Patient states has been having intermittent black stools for the past month, yesterday had 2 loose black bowel movements.  Patient is on outpatient iron daily for iron deficiency anemia. Patient has been having worsening hesitancy, dribbling, increased frequency of urine, denies hematuria or dysuria. He has been on Prilosec 40 mg once daily but last week increase to twice daily. Patient had previously had all of his teeth pulled, was wears dentures but has not been wearing them in the last 3 to 4 weeks is primarily been eating cottage cheese, yogurt and Jell-O, denies any nausea vomiting, dysphagia, odynophagia.  States his weight has been steady with his current diet.  Patient yet again states that he does not want surgery, chemotherapy at this time.  States he will be 54 in June and has "lived a good life".  Patient is currently listed as full code, discussed with him and he would like to change that.   Abnormal ED labs: Abnormal Labs Reviewed  CBC WITH DIFFERENTIAL/PLATELET - Abnormal; Notable for the following components:      Result Value   RBC 2.19 (*)    Hemoglobin 6.5 (*)    HCT 21.3 (*)    RDW 16.8 (*)    All other components within normal limits  COMPREHENSIVE METABOLIC PANEL - Abnormal; Notable for the following components:   Sodium 134 (*)    Potassium 3.1 (*)    Chloride 96 (*)  Glucose, Bld 181 (*)    BUN 34 (*)    Calcium 8.7 (*)    Total Protein 5.4 (*)    Albumin 2.9 (*)    All other components within normal limits  URINALYSIS, W/ REFLEX TO CULTURE (INFECTION SUSPECTED) - Abnormal; Notable for the following components:    Color, Urine STRAW (*)    All other components within normal limits  GLUCOSE, CAPILLARY - Abnormal; Notable for the following components:   Glucose-Capillary 158 (*)    All other components within normal limits  POC OCCULT BLOOD, ED - Abnormal; Notable for the following components:   Fecal Occult Bld POSITIVE (*)    All other components within normal limits  TROPONIN I (HIGH SENSITIVITY) - Abnormal; Notable for the following components:   Troponin I (High Sensitivity) 165 (*)    All other components within normal limits  TROPONIN I (HIGH SENSITIVITY) - Abnormal; Notable for the following components:   Troponin I (High Sensitivity) 138 (*)    All other components within normal limits  TROPONIN I (HIGH SENSITIVITY) - Abnormal; Notable for the following components:   Troponin I (High Sensitivity) 130 (*)    All other components within normal limits     Past Medical History:  Diagnosis Date   Anxiety    Cataract    Depression    Diabetes mellitus    GERD (gastroesophageal reflux disease)    Hyperlipemia    Hypertension     Surgical History:  He  has a past surgical history that includes Fracture surgery; Hernia repair; and Cholecystectomy (N/A, 07/30/2019). Family History:  His family history includes Lung cancer in his brother. Social History:   reports that he has never smoked. He has never used smokeless tobacco. He reports that he does not drink alcohol and does not use drugs.  Prior to Admission medications   Medication Sig Start Date End Date Taking? Authorizing Provider  amLODipine (NORVASC) 10 MG tablet Take 10 mg by mouth daily.  07/08/19   [provider]  buPROPion (WELLBUTRIN XL) 300 MG 24 hr tablet Take 300 mg by mouth daily. 11/14/16   [provider]  ferrous sulfate 325 (65 FE) MG EC tablet Take 1 tablet (325 mg total) by mouth daily with breakfast. 10/24/22   Heilingoetter, [name], [name]  gabapentin (NEURONTIN) 800 MG tablet Take 800 mg by  mouth 2 (two) times daily. 06/19/22   [provider]  L-Methylfolate-Algae-B12-B6 (METANX PO) Take 1 capsule by mouth 2 (two) times daily.    [provider]  LORazepam (ATIVAN) 0.5 MG tablet Take 0.5 mg by mouth 2 (two) times daily.     [provider]  Multiple Vitamin (MULITIVITAMIN WITH MINERALS) TABS Take 1 tablet by mouth daily.    [provider]  Omega-3 Fatty Acids (FISH OIL) 1000 MG CAPS Take by mouth.    [provider]  omeprazole (PRILOSEC) 40 MG capsule Take 1 capsule (40 mg total) by mouth in the morning and at bedtime. 09/30/22   Charlie Pitter III, MD  potassium chloride SA (KLOR-CON M) 20 MEQ tablet Take 1 tablet (20 mEq total) by mouth daily. 10/24/22   Heilingoetter, [name], [name]  primidone (MYSOLINE) 50 MG tablet Take 50 mg by mouth 2 (two) times daily.    [provider]  tamsulosin (FLOMAX) 0.4 MG CAPS capsule Take 0.8 mg by mouth daily after supper.    [provider]    Current Facility-Administered Medications  Medication  Dose Route Frequency Provider Last Rate Last Admin   0.9 %  sodium chloride infusion (Manually program via Guardrails IV Fluids)   Intravenous Once Gwyneth Sprout, MD       0.9 %  sodium chloride infusion   Intravenous Continuous [name], MD 75 mL/hr at 11/14/22 0551 New Bag at 11/14/22 0551   acetaminophen (TYLENOL) tablet 650 mg  650 mg Oral Q4H PRN [name], MD       alum & mag hydroxide-simeth (MAALOX/MYLANTA) 200-200-20 MG/5ML suspension 15 mL  15 mL Oral Q4H PRN Anthoney Harada, NP       amLODipine (NORVASC) tablet 10 mg  10 mg Oral Daily Pahwani, Daleen Bo, MD       buPROPion (WELLBUTRIN XL) 24 hr tablet 300 mg  300 mg Oral Daily Pahwani, Daleen Bo, MD       ferrous sulfate tablet 325 mg  325 mg Oral Q breakfast Pahwani, Daleen Bo, MD       gabapentin (NEURONTIN) capsule 800 mg  800 mg Oral BID [name], MD       LORazepam (ATIVAN) tablet 0.5 mg  0.5 mg Oral BID [name],  MD       nitroGLYCERIN (NITROSTAT) SL tablet 0.4 mg  0.4 mg Sublingual Q5 min PRN Anthoney Harada, NP   0.4 mg at 11/13/22 2342   ondansetron (ZOFRAN) injection 4 mg  4 mg Intravenous Q6H PRN [name], MD       Oral care mouth rinse  15 mL Mouth Rinse PRN [name], MD       pantoprazole (PROTONIX) injection 40 mg  40 mg Intravenous Q12H [name], MD       primidone (MYSOLINE) tablet 50 mg  50 mg Oral BID [name], MD       tamsulosin (FLOMAX) capsule 0.8 mg  0.8 mg Oral QPC supper [name], MD        Allergies as of 11/13/2022   (No Known Allergies)    Review of Systems:    Constitutional: No weight loss, fever, chills, weakness or fatigue HEENT: Eyes: No change in vision               Ears, Nose, Throat:  No change in hearing or congestion Skin: No rash or itching Cardiovascular: No chest pain, chest pressure or palpitations   Respiratory: No SOB or cough Gastrointestinal: See HPI and otherwise negative Genitourinary: No dysuria or change in urinary frequency Neurological: No headache, dizziness or syncope Musculoskeletal: No new muscle or joint pain Hematologic: No bleeding or bruising Psychiatric: No history of depression or anxiety     Physical Exam:  Vital signs in last 24 hours: Temp:  [97.4 F (36.3 C)-98.5 F (36.9 C)] 98 F (36.7 C) (05/17 0624) Pulse Rate:  [53-89] 54 (05/17 0624) Resp:  [14-20] 18 (05/17 0624) BP: (90-179)/(49-78) 147/67 (05/17 0624) SpO2:  [91 %-100 %] 92 % (05/17 0624) Last BM Date : 11/12/22 Last BM recorded by nurses in past 5 days No data recorded  General:   Pleasant, well developed male in no acute distress Head:  Normocephalic and atraumatic. Eyes: sclerae anicteric,conjunctive pale  Heart:  regular rate and rhythm, no murmurs or gallops Pulm: Clear anteriorly; no wheezing Abdomen:  Soft, Obese AB, Active bowel sounds. mild tenderness in the epigastrium and moderate suprapubic.Without guarding and Without  rebound, No organomegaly appreciated. Extremities:  Without edema. Msk:  Symmetrical without gross deformities. Peripheral pulses intact.  Neurologic:  Alert and  oriented x4;  No  focal deficits.  Skin:   Dry and intact without significant lesions or rashes. Psychiatric:  Cooperative. Normal mood and affect.  LAB RESULTS: Recent Labs    11/13/22 1750  WBC 5.2  HGB 6.5*  HCT 21.3*  PLT 287   BMET Recent Labs    11/13/22 1750  NA 134*  K 3.1*  CL 96*  CO2 30  GLUCOSE 181*  BUN 34*  CREATININE 1.13  CALCIUM 8.7*   LFT Recent Labs    11/13/22 1750  PROT 5.4*  ALBUMIN 2.9*  AST 20  ALT 21  ALKPHOS 50  BILITOT 0.4   PT/INR No results for input(s): "LABPROT", "INR" in the last 72 hours.  STUDIES: CT Head Wo Contrast  Result Date: 11/13/2022 CLINICAL DATA:  Neuro deficit, acute, stroke suspected, vision change EXAM: CT HEAD WITHOUT CONTRAST TECHNIQUE: Contiguous axial images were obtained from the base of the skull through the vertex without intravenous contrast. RADIATION DOSE REDUCTION: This exam was performed according to the departmental dose-optimization program which includes automated exposure control, adjustment of the mA and/or kV according to patient size and/or use of iterative reconstruction technique. COMPARISON:  10/18/2004 FINDINGS: Brain: Mild parenchymal volume loss is present, commensurate with the patient's age and stable since prior examination. Mild ventriculomegaly has developed, slightly disproportionate to the degree of parenchymal volume loss, possibly reflecting changes of asymmetric central atrophy or communicating hydrocephalus. No acute intracranial hemorrhage or infarct. No abnormal mass effect or midline shift. No abnormal intra or extra-axial mass lesion or fluid collection. Cerebellum is unremarkable. Vascular: No hyperdense vessel or unexpected calcification. Skull: Normal. Negative for fracture or focal lesion. Sinuses/Orbits: No acute finding.  Other: Mastoid air cells and middle ear cavities are clear. IMPRESSION: 1. No acute intracranial hemorrhage or infarct. 2. Interval development of mild ventriculomegaly, possibly reflecting changes of asymmetric central atrophy or communicating hydrocephalus. Electronically Signed   By: Helyn Numbers M.D.   On: 11/13/2022 17:58   DG Chest Port 1 View  Result Date: 11/13/2022 CLINICAL DATA:  Chest pain EXAM: PORTABLE CHEST 1 VIEW COMPARISON:  Chest x-ray 08/01/2019 FINDINGS: The heart size and mediastinal contours are within normal limits. Both lungs are clear. The visualized skeletal structures are unremarkable. IMPRESSION: No active disease. Electronically Signed   By: Darliss Cheney M.D.   On: 11/13/2022 17:37      Impression    GI bleed in setting of known invasive moderately differentiated gastric adenocarcinoma associated with epigastric and chest discomfort 09/30/2022 EGD with Dr. Myrtie Neither demonstrated nonbleeding cratered gastric ulcer with irregular and heaped edges measuring 20 x 25 mm.  HGB 6.5 MCV 97.3 Platelets 287 BUN 34 Cr 1.13-isolated BUN elevation Patient has been having melenic stools intermittently for the past month, on iron for IDA, on PPI twice daily for the past week, had isolated BUN elevation, most likely this represents bleeding from known gastric ulcer malignancy Patient declines surgery and is not surgical candidate  Chest pain in setting of acute GI bleeding and anemia Troponin 168 increased to 556, ST depression anterior leads Pending echocardiogram  Unremarkable chest x-ray Chest pressure did improve status post 2 PRBC, possible demand ischemia See cardiology note  Urinary frequency with hesitancy dribbling, suprapubic pain CT abdomen pelvis with contrast 2/14 left renal cyst versus solid renal mass, nonobstructing calculi, bladder unremarkable Bladder was unremarkable Previous prostate cancer with brachytherapy seeds Unremarkable urinalysis   Principal  Problem:   GIB (gastrointestinal bleeding) Active Problems:   Essential hypertension, benign   Gastric ulcer  without hemorrhage or perforation   Gastric cancer (HCC)   Chest pain   ABLA (acute blood loss anemia)    LOS: 1 day     Plan   Likely represents bleeding from known gastric ulcer malignancy, now with possible ACS/demand ischemia, not amendable to endoscopic treatment. Patient is not interested in surgical options at this time, would suggest maximizing supportive therapy.   Continue to monitor CBC, transfuse to keep greater than 8 with recent chest discomfort. PPI IV twice daily Carafate 3 times daily Will advance to full liquid diet Currently patient's full code, after discussing with him he would like to change this to potentially DNR.  Would suggest palliative care consult for goals of care discussion. Can consider surgical consult/IR consult if transfusion dependent/not responding to medical therapy if patient would want May want to consider involving Dr. Mosetta Putt in patient care inpatient Dr. Marina Goodell will see patient the patient and give further recommendations.   Thank you for your kind consultation, we will continue to follow.   Doree Albee  11/14/2022, 7:52 AM  GI ATTENDING  History, laboratories, x-rays, prior endoscopy report and pathology personally reviewed.  Patient seen and examined as outlined above.  Agree with comprehensive consultation note.  The patient's GI bleeding and associated anemia secondary to his ulcerative gastric cancer.  Unfortunately, there are no effective endoscopic therapies for this problem.  The definitive treatment is surgery (which I understand he is not a candidate nor interested).  At this point, support with transfusions, involve oncology regarding options (if any) and readdress goals of care.  Prognosis poor.  Will sign off.  Wilhemina Bonito. Eda Keys., M.D. [facility] Division of Gastroenterology

## 2022-11-14 NOTE — Progress Notes (Signed)
Received patient from previous shift nurse, will resume the patient care. 

## 2022-11-14 NOTE — Significant Event (Signed)
Rapid Response Event Note   Reason for Call :   Chest pain reported in patient admitted for GI bleed.  Initial Focused Assessment:  Patient is alert and oriented x 4 with sharp chest pain at 8/10 severity across his chest. Patient able to answer all questions and follow commands. Patient states he has some mild breathing difficulty, but mostly from left nasal congestion. Patient further reports pressure in his chest and numbness in his feet. Lungs are diminished bilaterally but without adverse sounds. 2-3+ pitting edema in feet/ankles bilaterally. Radial pulses 2+, pedal pulses 1+. Patient denies any active nausea, no vomiting or bloody sputum, no recent bm this shift, and previous stools were black. No abnormal bleeding from anywhere else per patient and bedside RN. Patient has mild urinary incontinence, but no frank bleeding seen in urine either. Hemoglobin known to be 6.5 and orders already in place for PRBC transfusion. Serial troponins already in place and appear to be trending down 138 and 130 respectively. EKG done suggests ischemia per electronic interpretation, but no ST elevation or other significant changes seen. Frequent PVCs observed on telemetry. CBG within normal range. Vitals reveal patient to be afebrile with HR 70-80, respirations 12-18, BP initially 146/63 then dropped to 90/49 after nitroglycerin administration. BP recovered to 126/60 thirty minutes after nitroglycerin administration.  Patient remained conversational with good humor throughout event expressing his appreciation for prompt attention and excellent care.  Interventions:  Assessment as documented above. Provider at bedside and reviewed all findings. EKG, serial vitals as documented in flowsheet, nitroglycerin administered as prescribed and documented in [facility]. Patient reported resolution of pain by end of this visit. Blood transfusion started as ordered.   Plan of Care:  Patient to remain at current level of care with close  monitoring and completion of transfusion. Bedside RN to immediately report any adverse symptoms or changes in condition. Patient and all care team members agree to this plan. Anticipate that patient will proceed with planned EGD and echocardiogram later today.  Event Summary:   MD Notified: Anthoney Harada, NP Call Time: 2324 Arrival Time: 2326 End Time: 0015  Lamona Curl, RN

## 2022-11-14 NOTE — Progress Notes (Signed)
PROGRESS NOTE    [name]  ZOX:[id number] DOB: 1932-11-09 DOA: 11/13/2022 PCP: [name], NP   Brief Narrative:  HPI: [name] is a 89 y.o. male with medical history significant of recent diagnosis of adenocarcinoma of stomach with the plan to start Keytruda in few weeks, BPH, hypertension, GERD, hyperlipidemia presented to ED with multiple complaints but mainly chest pain.  Patient states that he has chest pain which is intermittent since about a week.  However he is pointing towards his epigastric area.  The pain is dull, no aggravating or relieving factor, 6-7 out of 10 with no radiation.  He denies any nausea, diaphoresis, shortness of breath or any other complaint.  He further adds that he has issue with his vision in the right eye but his left eye is also losing vision but then he adds that this is going on for a long time.  He further tells me that he has edema in the left leg however on my examination, he does not appear to have any edema at all.    ED Course: Upon arrival to ED, patient fairly hemodynamically stable.  Mild hypokalemia with 3.1.  Hemoglobin 6.5.  Troponin 165.  FOBT positive.  Dr. Concha Se of GI consulted.  Hospitalist were called for admission for GI bleed and elevated troponin.  Assessment & Plan:   Principal Problem:   GIB (gastrointestinal bleeding) Active Problems:   Essential hypertension, benign   Gastric ulcer without hemorrhage or perforation   Gastric cancer (HCC)   Chest pain   ABLA (acute blood loss anemia)  Chest/epigastric pain:  Over night, he once again complained of chest pain and he was given nitroglycerin which relieved his pain.  EKG was repeated which this time show ST depression in anterior leads concerning for NSTEMI.  Troponins rising as well, they were stable around 130 but now> 389> 556.  Currently patient is chest pain-free.  Echo is pending.  Will continue to monitor on telemetry.  Due to GI bleed, he is  not a candidate for heparin.  I have consulted cardiology.   Upper GI bleed/acute blood loss anemia: FOBT positive.  Hemoglobin 6.5.  2 units of PRBC transfusion has been ordered by ED.  GI consulted.  Per GI, no plans for EGD.  Hemoglobin 9 posttransfusion.  Continue twice daily Protonix.  Patient tells me that he was given the option of surgery or chemotherapy when he was diagnosed with this cancer recently but he chose not to do any of the therapy.  He further adds " I am already 62, I have left my life, I do not want to prolong my life".  He further clarified that he thinks he is ready to meet the guard.  I have consulted palliative care to have further conversations with him.  We will monitor H&H for another day.   Hypertension: Controlled continue home medications.   BPH: Resume Flomax.   Hypokalemia: Low again, will replace.  DVT prophylaxis: SCDs Start: 11/13/22 2113   Code Status: Full Code  Family Communication:  None present at bedside.  Plan of care discussed with patient in length and he/she verbalized understanding and agreed with it.  Status is: Inpatient Remains inpatient appropriate because: Needs to be seen by cardiology and needs monitoring for H&H.   Estimated body mass index is 30.95 kg/m as calculated from the following:   Height as of 09/30/22: 5\' 5"  (1.651 m).   Weight as of 10/10/22: 84.4 kg.  Nutritional Assessment: There is no height or weight on file to calculate BMI.. Seen by dietician.  I agree with the assessment and plan as outlined below: Nutrition Status:        . Skin Assessment: I have examined the patient's skin and I agree with the wound assessment as performed by the wound care RN as outlined below:    Consultants:  GI, cardiology and palliative care  Procedures:  None  Antimicrobials:  Anti-infectives (From admission, onward)    None         Subjective: Seen and examined.  He has no complaints this  morning.  Objective: Vitals:   11/14/22 0319 11/14/22 0543 11/14/22 0544 11/14/22 0624  BP: (!) 142/56 (!) 154/60 (!) 154/60 (!) 147/67  Pulse: (!) 54 (!) 53 (!) 53 (!) 54  Resp: 14 16 16 18   Temp: 98.5 F (36.9 C) 97.8 F (36.6 C) 97.8 F (36.6 C) 98 F (36.7 C)  TempSrc: Oral Oral Oral Oral  SpO2: 100% 100% 100% 92%    Intake/Output Summary (Last 24 hours) at 11/14/2022 1007 Last data filed at 11/14/2022 0900 Gross per 24 hour  Intake 961.96 ml  Output 400 ml  Net 561.96 ml   There were no vitals filed for this visit.  Examination:  General exam: Appears calm and comfortable  Respiratory system: Clear to auscultation. Respiratory effort normal. Cardiovascular system: S1 & S2 heard, RRR. No JVD, murmurs, rubs, gallops or clicks. No pedal edema. Gastrointestinal system: Abdomen is nondistended, soft and mild epigastric tenderness. No organomegaly or masses felt. Normal bowel sounds heard. Central nervous system: Alert and oriented. No focal neurological deficits. Extremities: Symmetric 5 x 5 power. Skin: No rashes, lesions or ulcers Psychiatry: Judgement and insight appear normal. Mood & affect appropriate.    Data Reviewed: I have personally reviewed following labs and imaging studies  CBC: Recent Labs  Lab 11/13/22 1750 11/14/22 0737  WBC 5.2  --   NEUTROABS 3.6  --   HGB 6.5* 9.0*  HCT 21.3* 28.0*  MCV 97.3  --   PLT 287  --    Basic Metabolic Panel: Recent Labs  Lab 11/13/22 1750 11/14/22 0737  NA 134* 137  K 3.1* 3.3*  CL 96* 96*  CO2 30 31  GLUCOSE 181* 140*  BUN 34* 24*  CREATININE 1.13 1.02  CALCIUM 8.7* 8.6*  MG  --  1.7  PHOS  --  3.5   GFR: CrCl cannot be calculated (Unknown ideal weight.). Liver Function Tests: Recent Labs  Lab 11/13/22 1750  AST 20  ALT 21  ALKPHOS 50  BILITOT 0.4  PROT 5.4*  ALBUMIN 2.9*   Recent Labs  Lab 11/13/22 1750  LIPASE 35   No results for input(s): "AMMONIA" in the last 168 hours. Coagulation  Profile: No results for input(s): "INR", "PROTIME" in the last 168 hours. Cardiac Enzymes: No results for input(s): "CKTOTAL", "CKMB", "CKMBINDEX", "TROPONINI" in the last 168 hours. BNP (last 3 results) No results for input(s): "PROBNP" in the last 8760 hours. HbA1C: No results for input(s): "HGBA1C" in the last 72 hours. CBG: Recent Labs  Lab 11/13/22 2332  GLUCAP 158*   Lipid Profile: No results for input(s): "CHOL", "HDL", "LDLCALC", "TRIG", "CHOLHDL", "LDLDIRECT" in the last 72 hours. Thyroid Function Tests: Recent Labs    11/13/22 1750  TSH 1.380   Anemia Panel: No results for input(s): "VITAMINB12", "FOLATE", "FERRITIN", "TIBC", "IRON", "RETICCTPCT" in the last 72 hours. Sepsis Labs: No results for input(s): "PROCALCITON", "  LATICACIDVEN" in the last 168 hours.  No results found for this or any previous visit (from the past 240 hour(s)).   Radiology Studies: CT Head Wo Contrast  Result Date: 11/13/2022 CLINICAL DATA:  Neuro deficit, acute, stroke suspected, vision change EXAM: CT HEAD WITHOUT CONTRAST TECHNIQUE: Contiguous axial images were obtained from the base of the skull through the vertex without intravenous contrast. RADIATION DOSE REDUCTION: This exam was performed according to the departmental dose-optimization program which includes automated exposure control, adjustment of the mA and/or kV according to patient size and/or use of iterative reconstruction technique. COMPARISON:  10/18/2004 FINDINGS: Brain: Mild parenchymal volume loss is present, commensurate with the patient's age and stable since prior examination. Mild ventriculomegaly has developed, slightly disproportionate to the degree of parenchymal volume loss, possibly reflecting changes of asymmetric central atrophy or communicating hydrocephalus. No acute intracranial hemorrhage or infarct. No abnormal mass effect or midline shift. No abnormal intra or extra-axial mass lesion or fluid collection. Cerebellum  is unremarkable. Vascular: No hyperdense vessel or unexpected calcification. Skull: Normal. Negative for fracture or focal lesion. Sinuses/Orbits: No acute finding. Other: Mastoid air cells and middle ear cavities are clear. IMPRESSION: 1. No acute intracranial hemorrhage or infarct. 2. Interval development of mild ventriculomegaly, possibly reflecting changes of asymmetric central atrophy or communicating hydrocephalus. Electronically Signed   By: Helyn Numbers M.D.   On: 11/13/2022 17:58   DG Chest Port 1 View  Result Date: 11/13/2022 CLINICAL DATA:  Chest pain EXAM: PORTABLE CHEST 1 VIEW COMPARISON:  Chest x-ray 08/01/2019 FINDINGS: The heart size and mediastinal contours are within normal limits. Both lungs are clear. The visualized skeletal structures are unremarkable. IMPRESSION: No active disease. Electronically Signed   By: Darliss Cheney M.D.   On: 11/13/2022 17:37    Scheduled Meds:  sodium chloride   Intravenous Once   amLODipine  10 mg Oral Daily   buPROPion  300 mg Oral Daily   ferrous sulfate  325 mg Oral Q breakfast   gabapentin  800 mg Oral BID   LORazepam  0.5 mg Oral BID   pantoprazole (PROTONIX) IV  40 mg Intravenous Q12H   primidone  50 mg Oral BID   tamsulosin  0.8 mg Oral QPC supper   Continuous Infusions:  sodium chloride 75 mL/hr at 11/14/22 0551     LOS: 1 day   [name], MD [facility]  11/14/2022, 10:07 AM   *Please note that this is a verbal dictation therefore any spelling or grammatical errors are due to the "Dragon Medical One" system interpretation.  Please page via Amion and do not message via secure chat for urgent patient care matters. Secure chat can be used for non urgent patient care matters.  How to contact the [facility] Attending or Consulting provider 7A - 7P or covering provider during after hours 7P -7A, for this patient?  Check the care team in [facility] and look for a) attending/consulting [facility] provider listed and b) the [facility] team listed. Page  or secure chat 7A-7P. Log into [email or web address] and use [facility] universal password to access. If you do not have the password, please contact the hospital operator. Locate the [facility] provider you are looking for under [facility] and page to a number that you can be directly reached. If you still have difficulty reaching the provider, please page the [facility] (Director on Call) for the Hospitalists listed on amion for assistance.

## 2022-11-14 NOTE — Progress Notes (Signed)
Patient complaint chest pain 8/10, described pain is sharp in mid of chest.12 lead EKG preformed, charge nurse notified, rapid on bedside, on-call on bedside. Medication given per order.

## 2022-11-14 NOTE — Consult Note (Signed)
Consultation Note Date: 11/14/2022   Patient Name: [name]  DOB: May 06, 1933  MRN: [id number]  Age / Sex: 89 y.o., male  PCP: [name], NP Referring Physician: [name], MD  Reason for Consultation: Establishing goals of care  HPI/Patient Profile: 89 y.o. male    admitted on 11/13/2022    Clinical Assessment and Goals of Care: 89 year old gentleman who has been living at [facility] independent living for the past 18 years.  He has a son who lives locally who is noted to be his healthcare power of attorney agent, he also has a daughter who lives in [place].  Patient has past medical history of hypertension, adenocarcinoma of the stomach diagnosed earlier this year, history of BPH. Patient presented to the emergency department with chest pain and epigastric pain.  Patient was noted to have a hemoglobin of 6.5 g/dL and was given 2 to 3 units of PRBCs.  Patient remains admitted to hospital medicine service and a palliative consultation for CODE STATUS and goals of care discussions has been requested. Chart reviewed, palliative consult request received, patient seen and examined.  Discussions held.  Separately, call placed and discussed also with son [name] at number listed below.  See discussions below. Palliative medicine is specialized medical care for people living with serious illness. It focuses on providing relief from the symptoms and stress of a serious illness. The goal is to improve quality of life for both the patient and the family. Goals of care: Broad aims of medical therapy in relation to the patient's values and preferences. Our aim is to provide medical care aimed at enabling patients to achieve the goals that matter most to them, given the circumstances of their particular medical situation and their constraints.    NEXT OF KIN Son [name] at  [phone].  Also has a daughter who lives in [place].  SUMMARY OF RECOMMENDATIONS   Goals of care discussions: Discussed with the patient about his current hospitalization as well as his underlying serious illness.  In April 2024 patient underwent an EGD showing gastric ulcer with irregular edges with biopsy confirming invasive moderately differentiated carcinoma.  Patient has been having abdominal pain since February 2024.  CT scan with cavitation ulcer arising from posterior gastric antrum at the pylorus was noted.  Subsequently, patient saw Dr. Parke Poisson in oncology.  Patient declined surgery radiation and chemotherapy secondary to age and comorbidities.  Patient was supposed to start on immunotherapy with [employer] however this has not been initiated yet.  Patient arrived at the emergency room on 5-16 with complaints of chest discomfort epigastric discomfort.  He was seen and evaluated by cardiology as well as gastroenterology.  He remains admitted to hospital medicine service.  Goals of care discussions undertaken.  Discussed with the patient in detail.  Patient is alert awake oriented and is able to provide all aspects of his a history and is able to make all of his decisions.  Subsequently, call placed and also discussed with son Renae Fickle.  Patient is originally from [place], he has also lived in [place].  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  Patient's spouse is deceased.  He has a son and a daughter.  He reiterates that he was initially told he has a gastric ulcer and then was told he has cancer.  He states that he has had vision difficulties and lower extremity swelling and numbness.  He was having melena.    Patient states that he is Catholic and his faith is very important to him.  Patient again reiterates that he does not want to gastrectomy does not want chemotherapy.  Described about Keytruda and patient states he does not want that either.  He states that he  has a wonderful living situation at [facility] and knows that they have an assisted living facility as well.    I introduced  to him about hospice philosophy of care.  I discussed with him about differences between full code versus DNR/DNI.  Patient has previously told hospital medicine colleague regarding his preference for not being on tubes and machines at end-of-life and not undergoing aggressive interventions.  Discussed CODE STATUS in detail and patient again expresses preference for DNR/DNI.  Call placed and discussed with son about patient's wishes as well.  All of his questions addressed to the best of my ability.    Will request TOC consultation for exploring the option of the patient having hospice services towards the end of this hospitalization and for him to go to assisted living rather than back to independent living. Thank you for the consult.   Code Status/Advance Care Planning: DNR   Symptom Management:     Palliative Prophylaxis:  Frequent Pain Assessment  Additional Recommendations (Limitations, Scope, Preferences): No Surgical Procedures  Psycho-social/Spiritual:  Desire for further Chaplaincy support:yes Additional Recommendations: Education on Hospice  Prognosis:  < 6 months  Discharge Planning:      Exploring the option of [facility] assisted living with hospice.    Primary Diagnoses: Present on Admission:  GIB (gastrointestinal bleeding)  Chest pain  ABLA (acute blood loss anemia)  Essential hypertension, benign  Gastric cancer (HCC)  Gastric ulcer without hemorrhage or perforation   I have reviewed the medical record, interviewed the patient and family, and examined the patient. The following aspects are pertinent.  Past Medical History:  Diagnosis Date   Anxiety    Cataract    Depression    Diabetes mellitus    GERD (gastroesophageal reflux disease)    Hyperlipemia    Hypertension    Social History   Socioeconomic History    Marital status: Widowed    Spouse name: Not on file   Number of children: 2   Years of education: Not on file   Highest education level: Doctorate  Occupational History   Occupation: retired Actuary professor    Employer: [employer]  Tobacco Use   Smoking status: Never   Smokeless tobacco: Never  Vaping Use   Vaping Use: Never used  Substance and Sexual Activity   Alcohol use: No   Drug use: No   Sexual activity: Not on file  Other Topics Concern   Not on file  Social History Narrative   Lives in a senior center.  Has 2 children.  Retired Mudlogger at [employer].  Education; PhD.   Social Determinants of Health   Financial Resource Strain: Not on file  Food Insecurity: No Food Insecurity (10/10/2022)   Hunger Vital  Sign    Worried About Programme researcher, broadcasting/film/video in the Last Year: Never true    Ran Out of Food in the Last Year: Never true  Transportation Needs: No Transportation Needs (10/10/2022)   PRAPARE - Administrator, Civil Service (Medical): No    Lack of Transportation (Non-Medical): No  Physical Activity: Not on file  Stress: Not on file  Social Connections: Not on file   Family History  Problem Relation Age of Onset   Lung cancer Brother    Colon cancer Neg Hx    Esophageal cancer Neg Hx    Rectal cancer Neg Hx    Stomach cancer Neg Hx    Scheduled Meds:  sodium chloride   Intravenous Once   amLODipine  10 mg Oral Daily   buPROPion  300 mg Oral Daily   ferrous sulfate  325 mg Oral Q breakfast   gabapentin  800 mg Oral BID   LORazepam  0.5 mg Oral BID   pantoprazole  40 mg Oral BID   primidone  50 mg Oral BID   tamsulosin  0.8 mg Oral QPC supper   Continuous Infusions:  sodium chloride 75 mL/hr at 11/14/22 0551   PRN Meds:.acetaminophen, alum & mag hydroxide-simeth, nitroGLYCERIN, ondansetron (ZOFRAN) IV, mouth rinse Medications Prior to Admission:  Prior to Admission medications   Medication Sig Start Date End Date Taking? Authorizing  Provider  acetaminophen (TYLENOL) 500 MG tablet Take 500 mg by mouth every 6 (six) hours as needed for moderate pain or mild pain.   Yes [provider]  amLODipine (NORVASC) 10 MG tablet Take 10 mg by mouth daily.  07/08/19  Yes [provider]  buPROPion (WELLBUTRIN XL) 300 MG 24 hr tablet Take 300 mg by mouth daily. 11/14/16  Yes [provider]  chlorhexidine (PERIDEX) 0.12 % solution Use as directed 15 mLs in the mouth or throat 2 (two) times daily.   Yes [provider]  ferrous sulfate 325 (65 FE) MG EC tablet Take 1 tablet (325 mg total) by mouth daily with breakfast. 10/24/22  Yes Heilingoetter, [name], [name]  gabapentin (NEURONTIN) 800 MG tablet Take 800 mg by mouth 2 (two) times daily. 06/19/22  Yes [provider]  glipiZIDE (GLUCOTROL XL) 2.5 MG 24 hr tablet Take 2.5 mg by mouth daily with breakfast.   Yes [provider]  hydrochlorothiazide (HYDRODIURIL) 25 MG tablet Take 25 mg by mouth daily.   Yes [provider]  loratadine (CLARITIN) 10 MG tablet Take 10 mg by mouth daily.   Yes [provider]  LORazepam (ATIVAN) 0.5 MG tablet Take 0.5-1 mg by mouth at bedtime. Take 0.5 mg (1 tablet) by mouth every night at bedtime. May take an additional 0.5 mg (1 tablet) as needed.   Yes [provider]  Multiple Vitamin (MULITIVITAMIN WITH MINERALS) TABS Take 1 tablet by mouth daily.   Yes [provider]  Omega-3 Fatty Acids (FISH OIL) 1000 MG CAPS Take 1,000 mg by mouth daily.   Yes [provider]  omeprazole (PRILOSEC) 40 MG capsule Take 1 capsule (40 mg total) by mouth in the morning and at bedtime. Patient taking differently: Take 40 mg by mouth daily. 09/30/22  Yes Danis, Andreas Blower, MD  primidone (MYSOLINE) 50 MG tablet Take 50 mg by mouth 2 (two) times daily.   Yes [provider]  tamsulosin (FLOMAX) 0.4 MG CAPS capsule Take 0.8 mg by mouth daily after supper.  Yes [provider]  temazepam (RESTORIL) 30 MG capsule Take 30 mg by mouth at bedtime.   Yes [provider]  potassium chloride SA (KLOR-CON M) 20 MEQ tablet Take 1 tablet (20 mEq total) by mouth daily. Patient not taking: Reported on 11/14/2022 10/24/22   Heilingoetter, [name], [name]   No Known Allergies Review of Systems Complains of chest and epigastric abdominal discomfort which is improved than before. Physical Exam Well-developed well-nourished resting in bed Mild epigastric discomfort Regular work of breathing Awake alert oriented Trace lower extremity edema Mood and affect within normal limits  Vital Signs: BP (!) 154/61   Pulse 60   Temp 97.6 F (36.4 C) (Oral)   Resp 18   SpO2 91%  Pain Scale: 0-10   Pain Score: 3    SpO2: SpO2: 91 % O2 Device:SpO2: 91 % O2 Flow Rate: .O2 Flow Rate (L/min): 2 L/min  IO: Intake/output summary:  Intake/Output Summary (Last 24 hours) at 11/14/2022 1632 Last data filed at 11/14/2022 1500 Gross per 24 hour  Intake 1281.96 ml  Output 900 ml  Net 381.96 ml    LBM: Last BM Date : 11/12/22 Baseline Weight:   Most recent weight:       Palliative Assessment/Data:   Performance scale 40%.  Time In: 15.30 Time Out: 16.30 Time Total: 60 Greater than 50%  of this time was spent counseling and coordinating care related to the above assessment and plan.  Signed by: [name], MD   Please contact Palliative Medicine Team phone at [phone] for questions and concerns.  For individual provider: See Loretha Stapler

## 2022-11-15 DIAGNOSIS — I214 Non-ST elevation (NSTEMI) myocardial infarction: Secondary | ICD-10-CM

## 2022-11-15 DIAGNOSIS — K254 Chronic or unspecified gastric ulcer with hemorrhage: Secondary | ICD-10-CM | POA: Diagnosis not present

## 2022-11-15 LAB — CBC WITH DIFFERENTIAL/PLATELET
Abs Immature Granulocytes: 0.02 10*3/uL (ref 0.00–0.07)
Abs Immature Granulocytes: 0.02 10*3/uL (ref 0.00–0.07)
Basophils Absolute: 0 10*3/uL (ref 0.0–0.1)
Basophils Absolute: 0 10*3/uL (ref 0.0–0.1)
Basophils Relative: 0 %
Basophils Relative: 0 %
Eosinophils Absolute: 0.2 10*3/uL (ref 0.0–0.5)
Eosinophils Absolute: 0.2 10*3/uL (ref 0.0–0.5)
Eosinophils Relative: 4 %
Eosinophils Relative: 4 %
HCT: 29.8 % — ABNORMAL LOW (ref 39.0–52.0)
HCT: 29.9 % — ABNORMAL LOW (ref 39.0–52.0)
Hemoglobin: 9.2 g/dL — ABNORMAL LOW (ref 13.0–17.0)
Hemoglobin: 9.3 g/dL — ABNORMAL LOW (ref 13.0–17.0)
Immature Granulocytes: 1 %
Immature Granulocytes: 0 %
Lymphocytes Relative: 20 %
Lymphocytes Relative: 19 %
Lymphs Abs: 0.9 10*3/uL (ref 0.7–4.0)
Lymphs Abs: 1 10*3/uL (ref 0.7–4.0)
MCH: 29.1 pg (ref 26.0–34.0)
MCH: 28.9 pg (ref 26.0–34.0)
MCHC: 30.9 g/dL (ref 30.0–36.0)
MCHC: 31.1 g/dL (ref 30.0–36.0)
MCV: 94.3 fL (ref 80.0–100.0)
MCV: 92.9 fL (ref 80.0–100.0)
Monocytes Absolute: 0.5 10*3/uL (ref 0.1–1.0)
Monocytes Absolute: 0.6 10*3/uL (ref 0.1–1.0)
Monocytes Relative: 11 %
Monocytes Relative: 11 %
Neutro Abs: 2.8 10*3/uL (ref 1.7–7.7)
Neutro Abs: 3.6 10*3/uL (ref 1.7–7.7)
Neutrophils Relative %: 64 %
Neutrophils Relative %: 66 %
Platelets: 260 10*3/uL (ref 150–400)
Platelets: 288 10*3/uL (ref 150–400)
RBC: 3.16 MIL/uL — ABNORMAL LOW (ref 4.22–5.81)
RBC: 3.22 MIL/uL — ABNORMAL LOW (ref 4.22–5.81)
RDW: 17.7 % — ABNORMAL HIGH (ref 11.5–15.5)
RDW: 17.3 % — ABNORMAL HIGH (ref 11.5–15.5)
WBC: 4.4 10*3/uL (ref 4.0–10.5)
WBC: 5.3 10*3/uL (ref 4.0–10.5)
nRBC: 0 % (ref 0.0–0.2)
nRBC: 0 % (ref 0.0–0.2)

## 2022-11-15 LAB — LIPID PANEL
Cholesterol: 153 mg/dL (ref 0–200)
HDL: 39 mg/dL — ABNORMAL LOW (ref >40)
LDL Cholesterol: 97 mg/dL (ref 0–99)
Total CHOL/HDL Ratio: 3.9 RATIO
Triglycerides: 87 mg/dL (ref <150)
VLDL: 17 mg/dL (ref 0–40)

## 2022-11-15 LAB — BASIC METABOLIC PANEL
Anion gap: 6 (ref 5–15)
BUN: 15 mg/dL (ref 8–23)
CO2: 26 mmol/L (ref 22–32)
Calcium: 8 mg/dL — ABNORMAL LOW (ref 8.9–10.3)
Chloride: 101 mmol/L (ref 98–111)
Creatinine, Ser: 1 mg/dL (ref 0.61–1.24)
GFR, Estimated: >60 mL/min (ref >60)
Glucose, Bld: 151 mg/dL — ABNORMAL HIGH (ref 70–99)
Potassium: 3.8 mmol/L (ref 3.5–5.1)
Sodium: 133 mmol/L — ABNORMAL LOW (ref 135–145)

## 2022-11-15 MED ORDER — MORPHINE SULFATE 15 MG PO TABS: 15.0000 mg | ORAL_TABLET | Freq: Four times a day (QID) | ORAL | 0 refills | Status: DC | PRN

## 2022-11-15 MED ORDER — LORAZEPAM 0.5 MG PO TABS: 0.5000 mg | ORAL_TABLET | Freq: Every day | ORAL | 0 refills | Status: DC

## 2022-11-15 MED ORDER — POLYETHYLENE GLYCOL 3350 17 G PO PACK
17.0000 g | PACK | Freq: Once | ORAL | Status: AC
  Administered 2022-11-15: 17 g via ORAL
  Filled 2022-11-15: qty 1

## 2022-11-15 MED ORDER — PANTOPRAZOLE SODIUM 40 MG PO TBEC: 40.0000 mg | DELAYED_RELEASE_TABLET | Freq: Two times a day (BID) | ORAL | 0 refills | Status: AC

## 2022-11-15 MED ORDER — MORPHINE SULFATE (CONCENTRATE) 10 MG/0.5ML PO SOLN: 5.0000 mg | ORAL | Status: DC | PRN

## 2022-11-15 MED ORDER — NITROGLYCERIN 0.4 MG SL SUBL
0.4000 mg | SUBLINGUAL_TABLET | SUBLINGUAL | Status: DC | PRN
  Administered 2022-11-17 (×2): 0.4 mg via SUBLINGUAL
  Filled 2022-11-15: qty 1

## 2022-11-15 NOTE — TOC Initial Note (Signed)
Transition of Care [facility]) - Initial/Assessment Note   Patient Details  Name: [name] MRN: [id number] Date of Birth: 1932/07/18  Transition of Care [facility]) CM/SW Contact:    Ewing Schlein, LCSW Phone Number: 11/15/2022, 12:29 PM  Clinical Narrative: [facility] consulted as patient wants to return to [facility] with hospice. Patient currently resides in independent living, but per palliative patient [place] not [place] able [place] return [place] and will need to transition to the ALF at the facility. CSW called [facility] and was informed the patient/family will need to call Henreitta Cea or Cheri Guppy at the facility to work on changing to assisted living, but neither of them will be available until Monday (11/17/22). CSW attempted to call patient's son, [name], but was unable to reach him so CSW left VM.               Expected Discharge Plan:  (To be determined) Barriers to Discharge: Other (must enter comment) (Per palliative, patient cannot return to independent living at [facility] with hospice.)  Patient Goals and CMS Choice Patient states their goals for this hospitalization and ongoing recovery are:: Transition to assisted living at [email or web address] Compare Post Acute Care list provided to:: Patient Choice offered to / list presented to : Patient  Expected Discharge Plan and Services In-house Referral: Clinical Social Work Discharge Planning Services: CM Consult Post Acute Care Choice: Hospice Living arrangements for the past 2 months: Independent Living Facility            DME Arranged: N/A DME Agency: NA  Prior Living Arrangements/Services Living arrangements for the past 2 months: Independent Living Facility Lives with:: Facility Resident Patient language and need for interpreter reviewed:: Yes Need for Family Participation in Patient Care: Yes (Comment) Care giver support system in place?: Yes (comment) Criminal Activity/Legal  Involvement Pertinent to Current Situation/Hospitalization: No - Comment as needed  Emotional Assessment Orientation: : Oriented to Self, Oriented to Place, Oriented to  Time, Oriented to Situation Alcohol / Substance Use: Not Applicable Psych Involvement: No (comment)  Admission diagnosis:  Acute upper GI bleeding [K92.2] GIB (gastrointestinal bleeding) [K92.2] Symptomatic anemia [D64.9] Chest pain, unspecified type [R07.9] Patient Active Problem List   Diagnosis Date Noted   Palliative care by specialist 11/14/2022   Elevated troponin 11/14/2022   Symptomatic anemia 11/14/2022   GIB (gastrointestinal bleeding) 11/13/2022   Chest pain 11/13/2022   ABLA (acute blood loss anemia) 11/13/2022   Goals of care, counseling/discussion 10/24/2022   Gastric cancer (HCC) 10/09/2022   Abnormal CT scan, stomach 09/18/2022   Gastric ulcer without hemorrhage or perforation 09/18/2022   Leg swelling 03/12/2021   Acute cholecystitis 07/29/2019   Sinus bradycardia 11/26/2016   Prostate cancer (HCC) 02/03/2013   ANXIETY 03/07/2010   Essential hypertension, benign 03/07/2010   GERD 03/07/2010   HOARSENESS, CHRONIC 03/07/2010   DIABETES MELLITUS 03/06/2010   HYPERCHOLESTEROLEMIA 03/06/2010   DEPRESSION 03/06/2010   HIATAL HERNIA WITH REFLUX 03/06/2010   DIVERTICULOSIS, COLON 03/06/2010   IBS 03/06/2010   RENAL CALCULUS, RECURRENT 03/06/2010   COUGH 03/06/2010   PCP:  [name], NP Pharmacy:   [facility] - [place], [place] - [address] [name] [place] [address] Phone: [phone] Fax: [phone]  Social Determinants of Health (SDOH) Social History: SDOH Screenings   Food Insecurity: No Food Insecurity (10/10/2022)  Housing: Low Risk  (10/10/2022)  Transportation Needs: No Transportation Needs (10/10/2022)  Utilities: Not At Risk (  10/10/2022)  Depression (PHQ2-9): Low Risk  (10/10/2022)  Tobacco Use: Low Risk  (11/13/2022)   SDOH  Interventions:    Readmission Risk Interventions     No data to display

## 2022-11-15 NOTE — Progress Notes (Signed)
Rounding Note    Patient Name: [name] Date of Encounter: 11/15/2022  [facility] Cardiologist: None   Subjective   He had CP overnight, now feels much better  Inpatient Medications    Scheduled Meds:  sodium chloride   Intravenous Once   amLODipine  10 mg Oral Daily   buPROPion  300 mg Oral Daily   ferrous sulfate  325 mg Oral Q breakfast   gabapentin  800 mg Oral BID   LORazepam  0.5 mg Oral BID   pantoprazole  40 mg Oral BID   primidone  50 mg Oral BID   tamsulosin  0.8 mg Oral QPC supper   Continuous Infusions:  PRN Meds: acetaminophen, alum & mag hydroxide-simeth, ondansetron (ZOFRAN) IV, mouth rinse   Vital Signs    Vitals:   11/14/22 1017 11/14/22 1247 11/14/22 1959 11/15/22 0511  BP: (!) 172/68 (!) 154/61 (!) 176/67 134/74  Pulse: 60  63 70  Resp:   15 14  Temp: 97.6 F (36.4 C)  97.8 F (36.6 C) 97.6 F (36.4 C)  TempSrc: Oral  Oral Oral  SpO2: 91%  95% 94%    Intake/Output Summary (Last 24 hours) at 11/15/2022 0950 Last data filed at 11/15/2022 0700 Gross per 24 hour  Intake 799.98 ml  Output 1200 ml  Net -400.02 ml      10/10/2022   10:55 AM 09/30/2022   11:20 AM 09/18/2022    8:44 AM  Last 3 Weights  Weight (lbs) 186 lb 187 lb 187 lb 4 oz  Weight (kg) 84.369 kg 84.823 kg 84.936 kg      Telemetry    NSR - Personally Reviewed  ECG    No new - Personally Reviewed  Physical Exam   Vitals:   11/14/22 1959 11/15/22 0511  BP: (!) 176/67 134/74  Pulse: 63 70  Resp: 15 14  Temp: 97.8 F (36.6 C) 97.6 F (36.4 C)  SpO2: 95% 94%    GEN: No acute distress.   Neck: No JVD Cardiac: RRR, no murmurs, rubs, or gallops.  Respiratory: Clear to auscultation bilaterally. GI: Soft, nontender, non-distended  MS: pitting LE edema BL; No deformity. Neuro:  Nonfocal  Psych: Normal affect   Labs    High Sensitivity Troponin:   Recent Labs  Lab 11/13/22 1750 11/13/22 2142 11/13/22 2315 11/14/22 0737  11/14/22 0911  TROPONINIHS 165* 138* 130* 389* 556*     Chemistry Recent Labs  Lab 11/13/22 1750 11/14/22 0737 11/15/22 0804  NA 134* 137 133*  K 3.1* 3.3* 3.8  CL 96* 96* 101  CO2 30 31 26   GLUCOSE 181* 140* 151*  BUN 34* 24* 15  CREATININE 1.13 1.02 1.00  CALCIUM 8.7* 8.6* 8.0*  MG  --  1.7  --   PROT 5.4*  --   --   ALBUMIN 2.9*  --   --   AST 20  --   --   ALT 21  --   --   ALKPHOS 50  --   --   BILITOT 0.4  --   --   GFRNONAA >60 >60 >60  ANIONGAP 8 10 6     Lipids  Recent Labs  Lab 11/15/22 0804  CHOL 153  TRIG 87  HDL 39*  LDLCALC 97  CHOLHDL 3.9    Hematology Recent Labs  Lab 11/13/22 1750 11/14/22 0737 11/14/22 1613 11/15/22 0804  WBC 5.2  --  5.5 4.4  RBC 2.19*  --  3.06* 3.16*  HGB 6.5* 9.0* 8.8* 9.2*  HCT 21.3* 28.0* 28.5* 29.8*  MCV 97.3  --  93.1 94.3  MCH 29.7  --  28.8 29.1  MCHC 30.5  --  30.9 30.9  RDW 16.8*  --  18.7* 17.7*  PLT 287  --  281 260   Thyroid  Recent Labs  Lab 11/13/22 1750  TSH 1.380    BNPNo results for input(s): "BNP", "PROBNP" in the last 168 hours.  DDimer No results for input(s): "DDIMER" in the last 168 hours.   Radiology    ECHOCARDIOGRAM COMPLETE  Result Date: 11/14/2022    ECHOCARDIOGRAM REPORT   Patient Name:   LARRON ERNZEN [facility] Date of Exam: 11/14/2022 Medical Rec #:  [id number]                 Height:       65.0 in Accession #:    [id number]                Weight:       186.0 lb Date of Birth:  06-15-1933                 BSA:          1.918 m Patient Age:    89 years                  BP:           147/67 mmHg Patient Gender: M                         HR:           58 bpm. Exam Location:  Inpatient Procedure: 2D Echo, Color Doppler and Cardiac Doppler Indications:    Chest Pain  History:        Patient has prior history of Echocardiogram examinations and                 Patient has no prior history of Echocardiogram examinations.                 Gastric Cancer; Risk Factors:Hypertension, Diabetes and                  Dyslipidemia.  Sonographer:    Milbert Coulter Referring Phys: [id number] RAVI PAHWANI IMPRESSIONS  1. Left ventricular ejection fraction, by estimation, is 60 to 65%. The left ventricle has normal function. The left ventricle has no regional wall motion abnormalities. There is mild concentric left ventricular hypertrophy of the basal-septal segment. Left ventricular diastolic parameters are consistent with Grade I diastolic dysfunction (impaired relaxation).  2. Right ventricular systolic function is normal. The right ventricular size is normal.  3. Left atrial size was mildly dilated.  4. The mitral valve is normal in structure. Trivial mitral valve regurgitation. No evidence of mitral stenosis.  5. The aortic valve is tricuspid. There is mild calcification of the aortic valve. Aortic valve regurgitation is not visualized. Aortic valve sclerosis/calcification is present, without any evidence of aortic stenosis.  6. The inferior vena cava is normal in size with greater than 50% respiratory variability, suggesting right atrial pressure of 3 mmHg. FINDINGS  Left Ventricle: Left ventricular ejection fraction, by estimation, is 60 to 65%. The left ventricle has normal function. The left ventricle has no regional wall motion abnormalities. The left ventricular internal cavity size was normal in size. There is  mild concentric left ventricular hypertrophy of the  basal-septal segment. Left ventricular diastolic parameters are consistent with Grade I diastolic dysfunction (impaired relaxation). Right Ventricle: The right ventricular size is normal. No increase in right ventricular wall thickness. Right ventricular systolic function is normal. Left Atrium: Left atrial size was mildly dilated. Right Atrium: Right atrial size was normal in size. Pericardium: There is no evidence of pericardial effusion. Mitral Valve: The mitral valve is normal in structure. Trivial mitral valve regurgitation. No evidence of mitral  valve stenosis. Tricuspid Valve: The tricuspid valve is normal in structure. Tricuspid valve regurgitation is not demonstrated. No evidence of tricuspid stenosis. Aortic Valve: The aortic valve is tricuspid. There is mild calcification of the aortic valve. Aortic valve regurgitation is not visualized. Aortic valve sclerosis/calcification is present, without any evidence of aortic stenosis. Aortic valve mean gradient measures 4.0 mmHg. Aortic valve peak gradient measures 8.3 mmHg. Aortic valve area, by VTI measures 2.25 cm. Pulmonic Valve: The pulmonic valve was normal in structure. Pulmonic valve regurgitation is not visualized. No evidence of pulmonic stenosis. Aorta: The aortic root is normal in size and structure. Venous: The inferior vena cava is normal in size with greater than 50% respiratory variability, suggesting right atrial pressure of 3 mmHg. IAS/Shunts: No atrial level shunt detected by color flow Doppler.  LEFT VENTRICLE PLAX 2D LVIDd:         4.70 cm     Diastology LVIDs:         3.30 cm     LV e' medial:    7.72 cm/s LV PW:         1.20 cm     LV E/e' medial:  9.4 LV IVS:        1.20 cm     LV e' lateral:   9.03 cm/s LVOT diam:     2.00 cm     LV E/e' lateral: 8.1 LV SV:         77 LV SV Index:   40 LVOT Area:     3.14 cm  LV Volumes (MOD) LV vol d, MOD A2C: 42.4 ml LV vol d, MOD A4C: 72.3 ml LV vol s, MOD A2C: 19.1 ml LV vol s, MOD A4C: 26.0 ml LV SV MOD A2C:     23.3 ml LV SV MOD A4C:     72.3 ml LV SV MOD BP:      33.4 ml RIGHT VENTRICLE RV S prime:     12.60 cm/s TAPSE (M-mode): 2.3 cm LEFT ATRIUM             Index        RIGHT ATRIUM           Index LA diam:        4.60 cm 2.40 cm/m   RA Area:     12.40 cm LA Vol (A2C):   41.0 ml 21.38 ml/m  RA Volume:   24.40 ml  12.72 ml/m LA Vol (A4C):   67.9 ml 35.40 ml/m LA Biplane Vol: 54.6 ml 28.47 ml/m  AORTIC VALVE AV Area (Vmax):    2.42 cm AV Area (Vmean):   2.32 cm AV Area (VTI):     2.25 cm AV Vmax:           144.00 cm/s AV Vmean:           96.400 cm/s AV VTI:            0.340 m AV Peak Grad:      8.3 mmHg AV Mean Grad:  4.0 mmHg LVOT Vmax:         111.00 cm/s LVOT Vmean:        71.100 cm/s LVOT VTI:          0.244 m LVOT/AV VTI ratio: 0.72  AORTA Ao Root diam: 3.10 cm MITRAL VALVE MV Area (PHT): 3.30 cm     SHUNTS MV Decel Time: 230 msec     Systemic VTI:  0.24 m MV E velocity: 72.80 cm/s   Systemic Diam: 2.00 cm MV A velocity: 109.00 cm/s MV E/A ratio:  0.67 Arvilla Meres MD Electronically signed by Arvilla Meres MD Signature Date/Time: 11/14/2022/2:24:04 PM    Final    CT Head Wo Contrast  Result Date: 11/13/2022 CLINICAL DATA:  Neuro deficit, acute, stroke suspected, vision change EXAM: CT HEAD WITHOUT CONTRAST TECHNIQUE: Contiguous axial images were obtained from the base of the skull through the vertex without intravenous contrast. RADIATION DOSE REDUCTION: This exam was performed according to the departmental dose-optimization program which includes automated exposure control, adjustment of the mA and/or kV according to patient size and/or use of iterative reconstruction technique. COMPARISON:  10/18/2004 FINDINGS: Brain: Mild parenchymal volume loss is present, commensurate with the patient's age and stable since prior examination. Mild ventriculomegaly has developed, slightly disproportionate to the degree of parenchymal volume loss, possibly reflecting changes of asymmetric central atrophy or communicating hydrocephalus. No acute intracranial hemorrhage or infarct. No abnormal mass effect or midline shift. No abnormal intra or extra-axial mass lesion or fluid collection. Cerebellum is unremarkable. Vascular: No hyperdense vessel or unexpected calcification. Skull: Normal. Negative for fracture or focal lesion. Sinuses/Orbits: No acute finding. Other: Mastoid air cells and middle ear cavities are clear. IMPRESSION: 1. No acute intracranial hemorrhage or infarct. 2. Interval development of mild ventriculomegaly, possibly  reflecting changes of asymmetric central atrophy or communicating hydrocephalus. Electronically Signed   By: Helyn Numbers M.D.   On: 11/13/2022 17:58   DG Chest Port 1 View  Result Date: 11/13/2022 CLINICAL DATA:  Chest pain EXAM: PORTABLE CHEST 1 VIEW COMPARISON:  Chest x-ray 08/01/2019 FINDINGS: The heart size and mediastinal contours are within normal limits. Both lungs are clear. The visualized skeletal structures are unremarkable. IMPRESSION: No active disease. Electronically Signed   By: Darliss Cheney M.D.   On: 11/13/2022 17:37    Cardiac Studies  TTE 11/14/22 1. Left ventricular ejection fraction, by estimation, is 60 to 65%. The  left ventricle has normal function. The left ventricle has no regional  wall motion abnormalities. There is mild concentric left ventricular  hypertrophy of the basal-septal segment.  Left ventricular diastolic parameters are consistent with Grade I  diastolic dysfunction (impaired relaxation).   2. Right ventricular systolic function is normal. The right ventricular  size is normal.   3. Left atrial size was mildly dilated.   4. The mitral valve is normal in structure. Trivial mitral valve  regurgitation. No evidence of mitral stenosis.   5. The aortic valve is tricuspid. There is mild calcification of the  aortic valve. Aortic valve regurgitation is not visualized. Aortic valve  sclerosis/calcification is present, without any evidence of aortic  stenosis.   6. The inferior vena cava is normal in size with greater than 50%  respiratory variability, suggesting right atrial pressure of 3 mmHg.   Patient Profile     Boruch Mascorro is a 89 y.o. 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 Jacqulyn Bath.   Assessment & Plan  Chest pain NSTEMI - patient presented with chest pain/epigastric pain found to have Hgb 6.1  and elevated troponin - FOBT + and he was administered 2 units PRBCs. 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  No wall motion abnormalities on echo.  Not a candidate for anticoagulation or invasive evaluation in setting of GI bleed.   --> for chest pain and 3V disease, will give SL nitro.   LE edema -->He does have some pitting edema in the LE. No respiratory distress. Can stop home amlodipine and start losartan 50 mg daily. Recommended compression stockings Otherwise no changes from cardiology. We will sign off   GOC - palliative has been consulted to discuss DNR/DNI status  For questions or updates, please contact [facility] Please consult [email or web address] for contact info under        Signed, Maisie Fus, MD  11/15/2022, 9:50 AM

## 2022-11-15 NOTE — Progress Notes (Addendum)
PT c/o 7/10 mid sternal chest pain, pressure and heaviness.  VSS, but tele monitor shows change in complexes evident of ST depression.  Pt states that the pain is similar to the pain that he experienced last night.  One SL NTG given, complete relief within 5 minutes.  Will cont to monitor.

## 2022-11-15 NOTE — Discharge Summary (Signed)
Physician Discharge Summary  [name] ZOX:[id number] DOB: 1933-05-13 DOA: 11/13/2022  PCP: [name], NP  Admit date: 11/13/2022 Discharge date: 11/15/2022 30 Day Unplanned Readmission Risk Score    Flowsheet Row ED to Hosp-Admission (Current) from 11/13/2022 in [facility]  30 Day Unplanned Readmission Risk Score (%) 20.19 Filed at 11/15/2022 0800       This score is the patient's risk of an unplanned readmission within 30 days of being discharged (0 -100%). The score is based on dignosis, age, lab data, medications, orders, and past utilization.   Low:  0-14.9   Medium: 15-21.9   High: 22-29.9   Extreme: 30 and above          Admitted From: Independent living facility Disposition: Assisted living facility with hospice  Recommendations for Outpatient Follow-up:  Follow up with PCP in 1-2 weeks Please obtain BMP/CBC in one week Please follow up with your PCP on the following pending results: Unresulted Labs (From admission, onward)     Start     Ordered   11/14/22 1700  CBC with Differential/Platelet  5A & 5P,   R     Question:  Specimen collection method  Answer:  Lab=Lab collect   11/14/22 1013              Home Health: None Equipment/Devices: None  Discharge Condition: Stable will CODE STATUS: DNR Diet recommendation: Cardiac  Subjective: Seen and examined.  No complaints.  No more chest pain.  He is agreeable with discharge plan.  Brief/Interim Summary: [name] is a 89 y.o. male with medical history significant of recent diagnosis of adenocarcinoma of stomach with the plan to start Keytruda in few weeks, BPH, hypertension, GERD, hyperlipidemia presented to ED with multiple complaints but mainly chest pain which has been intermittent since about a week however he was pointing towards epigastric area.  No diaphoresis, shortness of breath, nausea or dizziness. He further tells me that he has edema in the left leg  however on my examination, he does not appear to have any edema at all.    Upon arrival to ED, patient fairly hemodynamically stable.  Mild hypokalemia with 3.1.  Hemoglobin 6.5.  Troponin 165.  FOBT positive.  Admitted under hospitalist service.  GI consulted.   Chest/epigastric pain: Initial EKG did not have any acute ST-T wave changes however he complained of chest pain within 12 hours of admission and this time EKG showed ST depression in anterior leads his troponin which was initially 130 but rose to> 389> 556.  Cardiology consulted but since patient was having GI bleed, he was not a candidate for heparin or any other intervention and then he subsequently chose to be DNR with no further management as well.  He is chest pain-free today.   Upper GI bleed/acute blood loss anemia: FOBT positive.  Hemoglobin 6.5.  Received 2 units of PRBC transfusion, hemoglobin 9.2 today.  Started on Protonix.  No further melena.  Seen by GI, patient said that he  was diagnosed with this cancer recently but he chose not to do any of the therapy.  He further added " I am already 30, I have left my life, I do not want to prolong my life".  He further clarified that he thinks he felt ready to meet God.  Palliative consulted.  Patient chose to transition to DNR and be released with hospice.    Hypertension: Controlled continue home medications.   BPH: Resume Flomax.  Hypokalemia: Resolved.  Goal of care discussion/disposition: Palliative consulted.  Patient chose to transition to DNR 11/14/2022.  He prefers to go with hospice.  Palliative care recommended sending to assisted living facility with hospice.  TOC is consulted and is working on making arrangements.  Patient will be discharged today, or whenever those arrangements are completed.  He will be discharged with morphine for pain control.  Discharge plan was discussed with patient and/or family member and they verbalized understanding and agreed with it.   Discharge Diagnoses:  Principal Problem:   GIB (gastrointestinal bleeding) Active Problems:   Essential hypertension, benign   Gastric ulcer without hemorrhage or perforation   Gastric cancer (HCC)   Chest pain   ABLA (acute blood loss anemia)   Palliative care by specialist   Elevated troponin   Symptomatic anemia    Discharge Instructions   Allergies as of 11/15/2022   No Known Allergies      Medication List     STOP taking these medications    potassium chloride SA 20 MEQ tablet Commonly known as: KLOR-CON M   temazepam 30 MG capsule Commonly known as: RESTORIL       TAKE these medications    acetaminophen 500 MG tablet Commonly known as: TYLENOL Take 500 mg by mouth every 6 (six) hours as needed for moderate pain or mild pain.   amLODipine 10 MG tablet Commonly known as: NORVASC Take 10 mg by mouth daily.   buPROPion 300 MG 24 hr tablet Commonly known as: WELLBUTRIN XL Take 300 mg by mouth daily.   chlorhexidine 0.12 % solution Commonly known as: PERIDEX Use as directed 15 mLs in the mouth or throat 2 (two) times daily.   ferrous sulfate 325 (65 FE) MG EC tablet Take 1 tablet (325 mg total) by mouth daily with breakfast.   Fish Oil 1000 MG Caps Take 1,000 mg by mouth daily.   gabapentin 800 MG tablet Commonly known as: NEURONTIN Take 800 mg by mouth 2 (two) times daily.   glipiZIDE 2.5 MG 24 hr tablet Commonly known as: GLUCOTROL XL Take 2.5 mg by mouth daily with breakfast.   hydrochlorothiazide 25 MG tablet Commonly known as: HYDRODIURIL Take 25 mg by mouth daily.   loratadine 10 MG tablet Commonly known as: CLARITIN Take 10 mg by mouth daily.   LORazepam 0.5 MG tablet Commonly known as: ATIVAN Take 1-2 tablets (0.5-1 mg total) by mouth at bedtime. Take 0.5 mg (1 tablet) by mouth every night at bedtime. May take an additional 0.5 mg (1 tablet) as needed.   morphine 15 MG tablet Commonly known as: MSIR Take 1 tablet (15 mg  total) by mouth every 6 (six) hours as needed for severe pain.   multivitamin with minerals Tabs tablet Take 1 tablet by mouth daily.   omeprazole 40 MG capsule Commonly known as: PRILOSEC Take 1 capsule (40 mg total) by mouth in the morning and at bedtime. What changed: when to take this   pantoprazole 40 MG tablet Commonly known as: PROTONIX Take 1 tablet (40 mg total) by mouth 2 (two) times daily.   primidone 50 MG tablet Commonly known as: MYSOLINE Take 50 mg by mouth 2 (two) times daily.   tamsulosin 0.4 MG Caps capsule Commonly known as: FLOMAX Take 0.8 mg by mouth daily after supper.        Follow-up Information     [name], NP Follow up in 1 week(s).   Specialty: Nurse Practitioner Contact information: [phone] [place] [address] [phone]                No Known Allergies  Consultations: GI, cardiology, palliative care   Procedures/Studies: ECHOCARDIOGRAM COMPLETE  Result Date: 11/14/2022    ECHOCARDIOGRAM REPORT   Patient Name:   WINDEL TENOLD [facility] Date of Exam: 11/14/2022 Medical Rec #:  [id number]                 Height:       65.0 in Accession #:    [id number]                Weight:       186.0 lb Date of Birth:  [birth year]                 BSA:          1.918 m Patient Age:    89 years                  BP:           147/67 mmHg Patient Gender: M                         HR:           58 bpm. Exam Location:  Inpatient Procedure: 2D Echo, Color Doppler and Cardiac Doppler Indications:    Chest Pain  History:        Patient has prior history of Echocardiogram examinations and                 Patient has no prior history of Echocardiogram examinations.                 Gastric Cancer; Risk Factors:Hypertension, Diabetes and                 Dyslipidemia.  Sonographer:    Milbert Coulter Referring Phys: [id number] Lisamarie Coke IMPRESSIONS  1. Left ventricular ejection fraction, by estimation, is 60 to 65%. The left ventricle has normal  function. The left ventricle has no regional wall motion abnormalities. There is mild concentric left ventricular hypertrophy of the basal-septal segment. Left ventricular diastolic parameters are consistent with Grade I diastolic dysfunction (impaired relaxation).  2. Right ventricular systolic function is normal. The right ventricular size is normal.  3. Left atrial size was mildly dilated.  4. The mitral valve is normal in structure. Trivial mitral valve regurgitation. No evidence of mitral stenosis.  5. The aortic valve is tricuspid. There is mild calcification of the aortic valve. Aortic valve regurgitation is not visualized. Aortic valve sclerosis/calcification is present, without any evidence of aortic stenosis.  6. The inferior vena cava is normal in size with greater than 50% respiratory variability, suggesting right atrial pressure of 3 mmHg. FINDINGS  Left Ventricle: Left ventricular ejection fraction, by estimation, is 60 to 65%. The left ventricle has normal function. The left ventricle has no regional wall motion abnormalities. The left ventricular internal cavity size was normal in size. There is  mild concentric left ventricular hypertrophy of the basal-septal segment. Left ventricular diastolic parameters are consistent with Grade I diastolic dysfunction (impaired relaxation). Right Ventricle: The right ventricular size is normal. No increase in right ventricular wall thickness. Right ventricular systolic function is normal. Left Atrium: Left atrial size was mildly dilated. Right Atrium: Right atrial size was normal in size. Pericardium: There is no evidence of pericardial effusion. Mitral Valve: The mitral  valve is normal in structure. Trivial mitral valve regurgitation. No evidence of mitral valve stenosis. Tricuspid Valve: The tricuspid valve is normal in structure. Tricuspid valve regurgitation is not demonstrated. No evidence of tricuspid stenosis. Aortic Valve: The aortic valve is tricuspid.  There is mild calcification of the aortic valve. Aortic valve regurgitation is not visualized. Aortic valve sclerosis/calcification is present, without any evidence of aortic stenosis. Aortic valve mean gradient measures 4.0 mmHg. Aortic valve peak gradient measures 8.3 mmHg. Aortic valve area, by VTI measures 2.25 cm. Pulmonic Valve: The pulmonic valve was normal in structure. Pulmonic valve regurgitation is not visualized. No evidence of pulmonic stenosis. Aorta: The aortic root is normal in size and structure. Venous: The inferior vena cava is normal in size with greater than 50% respiratory variability, suggesting right atrial pressure of 3 mmHg. IAS/Shunts: No atrial level shunt detected by color flow Doppler.  LEFT VENTRICLE PLAX 2D LVIDd:         4.70 cm     Diastology LVIDs:         3.30 cm     LV e' medial:    7.72 cm/s LV PW:         1.20 cm     LV E/e' medial:  9.4 LV IVS:        1.20 cm     LV e' lateral:   9.03 cm/s LVOT diam:     2.00 cm     LV E/e' lateral: 8.1 LV SV:         77 LV SV Index:   40 LVOT Area:     3.14 cm  LV Volumes (MOD) LV vol d, MOD A2C: 42.4 ml LV vol d, MOD A4C: 72.3 ml LV vol s, MOD A2C: 19.1 ml LV vol s, MOD A4C: 26.0 ml LV SV MOD A2C:     23.3 ml LV SV MOD A4C:     72.3 ml LV SV MOD BP:      33.4 ml RIGHT VENTRICLE RV S prime:     12.60 cm/s TAPSE (M-mode): 2.3 cm LEFT ATRIUM             Index        RIGHT ATRIUM           Index LA diam:        4.60 cm 2.40 cm/m   RA Area:     12.40 cm LA Vol (A2C):   41.0 ml 21.38 ml/m  RA Volume:   24.40 ml  12.72 ml/m LA Vol (A4C):   67.9 ml 35.40 ml/m LA Biplane Vol: 54.6 ml 28.47 ml/m  AORTIC VALVE AV Area (Vmax):    2.42 cm AV Area (Vmean):   2.32 cm AV Area (VTI):     2.25 cm AV Vmax:           144.00 cm/s AV Vmean:          96.400 cm/s AV VTI:            0.340 m AV Peak Grad:      8.3 mmHg AV Mean Grad:      4.0 mmHg LVOT Vmax:         111.00 cm/s LVOT Vmean:        71.100 cm/s LVOT VTI:          0.244 m LVOT/AV VTI ratio:  0.72  AORTA Ao Root diam: 3.10 cm MITRAL VALVE MV Area (PHT): 3.30 cm     SHUNTS  MV Decel Time: 230 msec     Systemic VTI:  0.24 m MV E velocity: 72.80 cm/s   Systemic Diam: 2.00 cm MV A velocity: 109.00 cm/s MV E/A ratio:  0.67 Arvilla Meres MD Electronically signed by Arvilla Meres MD Signature Date/Time: 11/14/2022/2:24:04 PM    Final    CT Head Wo Contrast  Result Date: 11/13/2022 CLINICAL DATA:  Neuro deficit, acute, stroke suspected, vision change EXAM: CT HEAD WITHOUT CONTRAST TECHNIQUE: Contiguous axial images were obtained from the base of the skull through the vertex without intravenous contrast. RADIATION DOSE REDUCTION: This exam was performed according to the departmental dose-optimization program which includes automated exposure control, adjustment of the mA and/or kV according to patient size and/or use of iterative reconstruction technique. COMPARISON:  10/18/2004 FINDINGS: Brain: Mild parenchymal volume loss is present, commensurate with the patient's age and stable since prior examination. Mild ventriculomegaly has developed, slightly disproportionate to the degree of parenchymal volume loss, possibly reflecting changes of asymmetric central atrophy or communicating hydrocephalus. No acute intracranial hemorrhage or infarct. No abnormal mass effect or midline shift. No abnormal intra or extra-axial mass lesion or fluid collection. Cerebellum is unremarkable. Vascular: No hyperdense vessel or unexpected calcification. Skull: Normal. Negative for fracture or focal lesion. Sinuses/Orbits: No acute finding. Other: Mastoid air cells and middle ear cavities are clear. IMPRESSION: 1. No acute intracranial hemorrhage or infarct. 2. Interval development of mild ventriculomegaly, possibly reflecting changes of asymmetric central atrophy or communicating hydrocephalus. Electronically Signed   By: Helyn Numbers M.D.   On: 11/13/2022 17:58   DG Chest Port 1 View  Result Date: 11/13/2022 CLINICAL  DATA:  Chest pain EXAM: PORTABLE CHEST 1 VIEW COMPARISON:  Chest x-ray 08/01/2019 FINDINGS: The heart size and mediastinal contours are within normal limits. Both lungs are clear. The visualized skeletal structures are unremarkable. IMPRESSION: No active disease. Electronically Signed   By: Darliss Cheney M.D.   On: 11/13/2022 17:37     Discharge Exam: Vitals:   11/14/22 1959 11/15/22 0511  BP: (!) 176/67 134/74  Pulse: 63 70  Resp: 15 14  Temp: 97.8 F (36.6 C) 97.6 F (36.4 C)  SpO2: 95% 94%   Vitals:   11/14/22 1017 11/14/22 1247 11/14/22 1959 11/15/22 0511  BP: (!) 172/68 (!) 154/61 (!) 176/67 134/74  Pulse: 60  63 70  Resp:   15 14  Temp: 97.6 F (36.4 C)  97.8 F (36.6 C) 97.6 F (36.4 C)  TempSrc: Oral  Oral Oral  SpO2: 91%  95% 94%    General: Pt is alert, awake, not in acute distress Cardiovascular: RRR, S1/S2 +, no rubs, no gallops Respiratory: CTA bilaterally, no wheezing, no rhonchi Abdominal: Soft, NT, ND, bowel sounds + Extremities: no edema, no cyanosis    The results of significant diagnostics from this hospitalization (including imaging, microbiology, ancillary and laboratory) are listed below for reference.     Microbiology: No results found for this or any previous visit (from the past 240 hour(s)).   Labs: BNP (last 3 results) No results for input(s): "BNP" in the last 8760 hours. Basic Metabolic Panel: Recent Labs  Lab 11/13/22 1750 11/14/22 0737 11/15/22 0804  NA 134* 137 133*  K 3.1* 3.3* 3.8  CL 96* 96* 101  CO2 30 31 26   GLUCOSE 181* 140* 151*  BUN 34* 24* 15  CREATININE 1.13 1.02 1.00  CALCIUM 8.7* 8.6* 8.0*  MG  --  1.7  --   PHOS  --  3.5  --  Liver Function Tests: Recent Labs  Lab 11/13/22 1750  AST 20  ALT 21  ALKPHOS 50  BILITOT 0.4  PROT 5.4*  ALBUMIN 2.9*   Recent Labs  Lab 11/13/22 1750  LIPASE 35   No results for input(s): "AMMONIA" in the last 168 hours. CBC: Recent Labs  Lab 11/13/22 1750  11/14/22 0737 11/14/22 1613 11/15/22 0804  WBC 5.2  --  5.5 4.4  NEUTROABS 3.6  --  3.8 2.8  HGB 6.5* 9.0* 8.8* 9.2*  HCT 21.3* 28.0* 28.5* 29.8*  MCV 97.3  --  93.1 94.3  PLT 287  --  281 260   Cardiac Enzymes: No results for input(s): "CKTOTAL", "CKMB", "CKMBINDEX", "TROPONINI" in the last 168 hours. BNP: Invalid input(s): "POCBNP" CBG: Recent Labs  Lab 11/13/22 2332  GLUCAP 158*   D-Dimer No results for input(s): "DDIMER" in the last 72 hours. Hgb A1c Recent Labs    11/14/22 0910  HGBA1C 4.8   Lipid Profile Recent Labs    11/15/22 0804  CHOL 153  HDL 39*  LDLCALC 97  TRIG 87  CHOLHDL 3.9   Thyroid function studies Recent Labs    11/13/22 1750  TSH 1.380   Anemia work up No results for input(s): "VITAMINB12", "FOLATE", "FERRITIN", "TIBC", "IRON", "RETICCTPCT" in the last 72 hours. Urinalysis    Component Value Date/Time   COLORURINE STRAW (A) 11/13/2022 1854   APPEARANCEUR CLEAR 11/13/2022 1854   LABSPEC 1.008 11/13/2022 1854   PHURINE 6.0 11/13/2022 1854   GLUCOSEU NEGATIVE 11/13/2022 1854   HGBUR NEGATIVE 11/13/2022 1854   BILIRUBINUR NEGATIVE 11/13/2022 1854   BILIRUBINUR neg 08/04/2011 2026   KETONESUR NEGATIVE 11/13/2022 1854   PROTEINUR NEGATIVE 11/13/2022 1854   UROBILINOGEN 0.2 03/29/2012 1438   NITRITE NEGATIVE 11/13/2022 1854   LEUKOCYTESUR NEGATIVE 11/13/2022 1854   Sepsis Labs Recent Labs  Lab 11/13/22 1750 11/14/22 1613 11/15/22 0804  WBC 5.2 5.5 4.4   Microbiology No results found for this or any previous visit (from the past 240 hour(s)).   Time coordinating discharge: Over 30 minutes  SIGNED:   [name], MD  [facility] 11/15/2022, 11:10 AM *Please note that this is a verbal dictation therefore any spelling or grammatical errors are due to the "Dragon [employer] One" system interpretation. If 7PM-7AM, please contact night-coverage [email or web address]

## 2022-11-15 NOTE — Progress Notes (Signed)
Daily Progress Note   Patient Name: [name]       Date: 11/15/2022 DOB: [birth year]  Age: 89 y.o. MRN#: [id number] Attending Physician: Jason Closs, MD Primary Care Physician: Jason Paris, NP Admit Date: 11/13/2022  Reason for Consultation/Follow-up: Establishing goals of care  Subjective: No chest pain, resting in chair, add low dose Morphine solution.   Length of Stay: 2  Current Medications: Scheduled Meds:   sodium chloride   Intravenous Once   amLODipine  10 mg Oral Daily   buPROPion  300 mg Oral Daily   ferrous sulfate  325 mg Oral Q breakfast   gabapentin  800 mg Oral BID   LORazepam  0.5 mg Oral BID   pantoprazole  40 mg Oral BID   primidone  50 mg Oral BID   tamsulosin  0.8 mg Oral QPC supper    Continuous Infusions:   PRN Meds: acetaminophen, alum & mag hydroxide-simeth, morphine CONCENTRATE, nitroGLYCERIN, ondansetron (ZOFRAN) IV, mouth rinse  Physical Exam         Awake alert No distress Sitting in chair Some LE edema  Vital Signs: BP 134/74 (BP Location: Left Arm)   Pulse 70   Temp 97.6 F (36.4 C) (Oral)   Resp 14   SpO2 94%  SpO2: SpO2: 94 % O2 Device: O2 Device: Room Air O2 Flow Rate: O2 Flow Rate (L/min): 2 L/min  Intake/output summary:  Intake/Output Summary (Last 24 hours) at 11/15/2022 1032 Last data filed at 11/15/2022 0700 Gross per 24 hour  Intake 799.98 ml  Output 1200 ml  Net -400.02 ml   LBM: Last BM Date : 11/12/22 Baseline Weight:   Most recent weight:         Palliative Assessment/Data:      Patient Active Problem List   Diagnosis Date Noted   Palliative care by specialist 11/14/2022   Elevated troponin 11/14/2022   Symptomatic anemia 11/14/2022   GIB (gastrointestinal bleeding) 11/13/2022   Chest  pain 11/13/2022   ABLA (acute blood loss anemia) 11/13/2022   Goals of care, counseling/discussion 10/24/2022   Gastric cancer (HCC) 10/09/2022   Abnormal CT scan, stomach 09/18/2022   Gastric ulcer without hemorrhage or perforation 09/18/2022   Leg swelling 03/12/2021   Acute cholecystitis 07/29/2019   Sinus bradycardia 11/26/2016   Prostate cancer (HCC) 02/03/2013  ANXIETY 03/07/2010   Essential hypertension, benign 03/07/2010   GERD 03/07/2010   HOARSENESS, CHRONIC 03/07/2010   DIABETES MELLITUS 03/06/2010   HYPERCHOLESTEROLEMIA 03/06/2010   DEPRESSION 03/06/2010   HIATAL HERNIA WITH REFLUX 03/06/2010   DIVERTICULOSIS, COLON 03/06/2010   IBS 03/06/2010   RENAL CALCULUS, RECURRENT 03/06/2010   COUGH 03/06/2010    Palliative Care Assessment & Plan   Patient Profile:    Assessment:  [name] is a 89 y.o. male with a hx of hypertension, adenocarcinoma of the stomach diagnosed earlier this year, BPH  Chest pain  Recommendations/Plan:  ALF with hospice Low dose PO MSO4 PRN pain/dyspnea    Code Status:    Code Status Orders  (From admission, onward)           Start     Ordered   11/14/22 1632  Do not attempt resuscitation (DNR)  Continuous       Question Answer Comment  If patient has no pulse and is not breathing Do Not Attempt Resuscitation   If patient has a pulse and/or is breathing: Medical Treatment Goals LIMITED ADDITIONAL INTERVENTIONS: Use medication/IV fluids and cardiac monitoring as indicated; Do not use intubation or mechanical ventilation (DNI), also provide comfort medications.  Transfer to Progressive/Stepdown as indicated, avoid Intensive Care.   Consent: Discussion documented in EHR or advanced directives reviewed      11/14/22 1632           Code Status History     Date Active Date Inactive Code Status Order ID Comments User Context   11/13/2022 2114 11/14/2022 1632 Full Code [id number]  Jason Closs, MD ED   07/29/2019  2105 08/02/2019 2354 Full Code [id number]  [name], [name], MD ED       Prognosis:  < 6 months  Discharge Planning: ALF with hospice.   Care plan was discussed with patient and TRH MD.   Thank you for allowing the Palliative Medicine Team to assist in the care of this patient. Mod MDM.      Greater than 50%  of this time was spent counseling and coordinating care related to the above assessment and plan.  [name], MD  Please contact Palliative Medicine Team phone at [phone] for questions and concerns.

## 2022-11-15 NOTE — Progress Notes (Signed)
Mobility Specialist - Progress Note   11/15/22 1129  Mobility  Activity Ambulated with assistance in hallway  Level of Assistance Standby assist, set-up cues, supervision of patient - no hands on  Assistive Device Cane  Distance Ambulated (ft) 250 ft  Activity Response Tolerated well  Mobility Referral Yes  $Mobility charge 1 Mobility  Mobility Specialist Start Time (ACUTE ONLY) 1117  Mobility Specialist Stop Time (ACUTE ONLY) 1128  Mobility Specialist Time Calculation (min) (ACUTE ONLY) 11 min     11/15/22 1129  Mobility  Activity Ambulated with assistance in hallway  Level of Assistance Standby assist, set-up cues, supervision of patient - no hands on  Assistive Device [employer] Ambulated (ft) 250 ft  Activity Response Tolerated well  Mobility Referral Yes  $Mobility charge 1 Mobility  Mobility Specialist Start Time (ACUTE ONLY) 1117  Mobility Specialist Stop Time (ACUTE ONLY) 1128  Mobility Specialist Time Calculation (min) (ACUTE ONLY) 11 min   Pt received in recliner and agreeable to mobility. No complaints during session. Pt to recliner after session with all needs met.    [facility]

## 2022-11-16 DIAGNOSIS — R531 Weakness: Secondary | ICD-10-CM

## 2022-11-16 DIAGNOSIS — K254 Chronic or unspecified gastric ulcer with hemorrhage: Secondary | ICD-10-CM | POA: Diagnosis not present

## 2022-11-16 LAB — CBC WITH DIFFERENTIAL/PLATELET
Abs Immature Granulocytes: 0.02 10*3/uL (ref 0.00–0.07)
Basophils Absolute: 0 10*3/uL (ref 0.0–0.1)
Basophils Relative: 0 %
Eosinophils Absolute: 0.2 10*3/uL (ref 0.0–0.5)
Eosinophils Relative: 4 %
HCT: 27.4 % — ABNORMAL LOW (ref 39.0–52.0)
Hemoglobin: 8.3 g/dL — ABNORMAL LOW (ref 13.0–17.0)
Immature Granulocytes: 0 %
Lymphocytes Relative: 17 %
Lymphs Abs: 0.8 10*3/uL (ref 0.7–4.0)
MCH: 28.2 pg (ref 26.0–34.0)
MCHC: 30.3 g/dL (ref 30.0–36.0)
MCV: 93.2 fL (ref 80.0–100.0)
Monocytes Absolute: 0.6 10*3/uL (ref 0.1–1.0)
Monocytes Relative: 13 %
Neutro Abs: 3 10*3/uL (ref 1.7–7.7)
Neutrophils Relative %: 66 %
Platelets: 250 10*3/uL (ref 150–400)
RBC: 2.94 MIL/uL — ABNORMAL LOW (ref 4.22–5.81)
RDW: 16.8 % — ABNORMAL HIGH (ref 11.5–15.5)
WBC: 4.6 10*3/uL (ref 4.0–10.5)
nRBC: 0 % (ref 0.0–0.2)

## 2022-11-16 MED ORDER — POLYETHYLENE GLYCOL 3350 17 G PO PACK
17.0000 g | PACK | Freq: Every day | ORAL | Status: DC
  Administered 2022-11-16 – 2022-11-17 (×2): 17 g via ORAL
  Filled 2022-11-16 (×2): qty 1

## 2022-11-16 MED ORDER — SENNOSIDES-DOCUSATE SODIUM 8.6-50 MG PO TABS
1.0000 | ORAL_TABLET | Freq: Two times a day (BID) | ORAL | Status: DC
  Administered 2022-11-16 – 2022-11-17 (×3): 1 via ORAL
  Filled 2022-11-16 (×3): qty 1

## 2022-11-16 MED ORDER — BISACODYL 10 MG RE SUPP
10.0000 mg | Freq: Once | RECTAL | Status: AC
  Administered 2022-11-17: 10 mg via RECTAL
  Filled 2022-11-16: qty 1

## 2022-11-16 MED ORDER — LOSARTAN POTASSIUM 50 MG PO TABS
25.0000 mg | ORAL_TABLET | Freq: Every day | ORAL | Status: DC
  Administered 2022-11-16 – 2022-11-17 (×2): 25 mg via ORAL
  Filled 2022-11-16 (×2): qty 1

## 2022-11-16 NOTE — Progress Notes (Signed)
Daily Progress Note   Patient Name: [name]       Date: 11/16/2022 DOB: 1932/12/10  Age: 89 y.o. MRN#: [id number] Attending Physician: [name], MD Primary Care Physician: [name], NP Admit Date: 11/13/2022  Reason for Consultation/Follow-up: Establishing goals of care  Subjective: No chest pain, resting in chair, now on low dose Morphine PO. Denies shortness of breath.   Length of Stay: 3  Current Medications: Scheduled Meds:   sodium chloride   Intravenous Once   bisacodyl  10 mg Rectal Once   buPROPion  300 mg Oral Daily   ferrous sulfate  325 mg Oral Q breakfast   gabapentin  800 mg Oral BID   LORazepam  0.5 mg Oral BID   losartan  25 mg Oral Daily   pantoprazole  40 mg Oral BID   polyethylene glycol  17 g Oral Daily   primidone  50 mg Oral BID   senna-docusate  1 tablet Oral BID   tamsulosin  0.8 mg Oral QPC supper    Continuous Infusions:   PRN Meds: acetaminophen, alum & mag hydroxide-simeth, morphine CONCENTRATE, nitroGLYCERIN, ondansetron (ZOFRAN) IV, mouth rinse  Physical Exam         Awake alert No distress Sitting in chair Some LE edema  Vital Signs: BP (!) 133/59 (BP Location: Left Arm)   Pulse (!) 55   Temp 97.9 F (36.6 C) (Oral)   Resp 18   SpO2 100%  SpO2: SpO2: 100 % O2 Device: O2 Device: Room Air O2 Flow Rate: O2 Flow Rate (L/min): 2 L/min  Intake/output summary:  Intake/Output Summary (Last 24 hours) at 11/16/2022 1330 Last data filed at 11/15/2022 1900 Gross per 24 hour  Intake --  Output 400 ml  Net -400 ml    LBM: Last BM Date : 11/12/22 Baseline Weight:   Most recent weight:         Palliative Assessment/Data:      Patient Active Problem List   Diagnosis Date Noted   Palliative care by  specialist 11/14/2022   Elevated troponin 11/14/2022   Symptomatic anemia 11/14/2022   GIB (gastrointestinal bleeding) 11/13/2022   Chest pain 11/13/2022   ABLA (acute blood loss anemia) 11/13/2022   Goals of care, counseling/discussion 10/24/2022   Gastric cancer (HCC) 10/09/2022   Abnormal CT scan,  stomach 09/18/2022   Gastric ulcer without hemorrhage or perforation 09/18/2022   Leg swelling 03/12/2021   Acute cholecystitis 07/29/2019   Sinus bradycardia 11/26/2016   Prostate cancer (HCC) 02/03/2013   ANXIETY 03/07/2010   Essential hypertension, benign 03/07/2010   GERD 03/07/2010   HOARSENESS, CHRONIC 03/07/2010   DIABETES MELLITUS 03/06/2010   HYPERCHOLESTEROLEMIA 03/06/2010   DEPRESSION 03/06/2010   HIATAL HERNIA WITH REFLUX 03/06/2010   DIVERTICULOSIS, COLON 03/06/2010   IBS 03/06/2010   RENAL CALCULUS, RECURRENT 03/06/2010   COUGH 03/06/2010    Palliative Care Assessment & Plan   Patient Profile:    Assessment:  [name] is a 89 y.o. 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  Low dose PO MSO4 PRN pain/dyspnea Overall, patient is aware of the serious and incurable nature of his condition.  He states that that he is not afraid to die, he states that he is ready to meet his maker and knows that he will go whenever it is his time.  He reiterates DNR/DNI and is accepting of hospice support at [facility].  Code Status:    Code Status Orders  (From admission, onward)           Start     Ordered   11/14/22 1632  Do not attempt resuscitation (DNR)  Continuous       Question Answer Comment  If patient has no pulse and is not breathing Do Not Attempt Resuscitation   If patient has a pulse and/or is breathing: Medical Treatment Goals LIMITED ADDITIONAL INTERVENTIONS: Use medication/IV fluids and cardiac  monitoring as indicated; Do not use intubation or mechanical ventilation (DNI), also provide comfort medications.  Transfer to Progressive/Stepdown as indicated, avoid Intensive Care.   Consent: Discussion documented in EHR or advanced directives reviewed      11/14/22 1632           Code Status History     Date Active Date Inactive Code Status Order ID Comments User Context   11/13/2022 2114 11/14/2022 1632 Full Code [id number]  [name], MD ED   07/29/2019 2105 08/02/2019 2354 Full Code [id number]  [name], [name], MD ED       Prognosis:  < 6 months  Discharge Planning: ALF with hospice.   Care plan was discussed with patient   Thank you for allowing the Palliative Medicine Team to assist in the care of this patient. Mod MDM.      Greater than 50%  of this time was spent counseling and coordinating care related to the above assessment and plan.  [name], MD  Please contact Palliative Medicine Team phone at [phone] for questions and concerns.

## 2022-11-16 NOTE — Progress Notes (Addendum)
PROGRESS NOTE  [name]  JYN:[id number] DOB: 07-Jun-1933 DOA: 11/13/2022 PCP: [name], NP   Brief Narrative: Patient is a 89 year old male with past medical history of recent diagnosis of adenocarcinoma of the stomach, BPH, hypertension, GERD, hyperlipidemia who presented to the emergency department with complaint of chest pain.  On presentation his hemoglobin was 6.5, troponin of 165, FOBT was positive.  EKG showed ST depression in the anterior leads and troponins continue to rise.  Cardiology consulted.  Extensive goals of care discussed by palliative care due to his history of gastric cancer and CODE STATUS was changed to DNR.  Current plan is to discharge him to ALF with hospice.  TOC following.  Likely will happen tomorrow.  Assessment & Plan:  Principal Problem:   GIB (gastrointestinal bleeding) Active Problems:   Essential hypertension, benign   Gastric ulcer without hemorrhage or perforation   Gastric cancer (HCC)   Chest pain   ABLA (acute blood loss anemia)   Palliative care by specialist   Elevated troponin   Symptomatic anemia  Chest pain/epigastric pain: Presented with elevated troponin, EKG changes.  Chest CT on 4/24 showed three-vessel coronary artery disease.  Echo showed normal left ventricular systolic function.  Cardiology suspected demand ischemia in the setting of anemia.  Normal wall motion abnormality was seen on echo.  And he is not a candidate for anticoagulation.  Cardiology signed off.  Denies any chest pain today.  Upper GI bleed GI bleed: Likely secondary to gastric cancer.  FOBT was positive.  Hemoglobin was 6.1 on admission.  He was transfused with 2 units of PRBC.  GI was consulted.  Plan for monitoring.  Currently hemoglobin is stable in the range of 8-9.  Denies nausea or vomiting.  On full liquid diet.  GI has signed off  Constipation: Having issues with constipation but denies any abdominal pain.  Continue MiraLAX, Senokot .  Ordered  a dose of Dulcolax suppository   Hypertension/lower extremity edema: Amlodipine discontinued.  Started on losartan for hypertension  BPH: Flomax  Hypokalemia: Potassium supplemented and corrected  Goals of Care: Palliative care   following.  CODE STATUS changed to DNR.  Plan is for hospice approach.  TOC assisting with discharge to ALF with hospice.  Will happen on Monday         DVT prophylaxis:SCDs Start: 11/13/22 2113     Code Status: DNR  Family Communication: called and discussed with son on phone 5/19  Patient status:Inpatient  Patient is from :ILF  Anticipated discharge to:ALF with hospcie  Estimated DC date:tomorrow   Consultants: GI, cardiology, palliative care  Procedures: None  Antimicrobials:  Anti-infectives (From admission, onward)    None       Subjective: Patient seen and examined the bedside today.  Appears very comfortable.  Sitting in the chair.  Denies any nausea, abdominal pain, chest pain.  Objective: Vitals:   11/15/22 0511 11/15/22 1326 11/15/22 2100 11/16/22 0505  BP: 134/74 (!) 149/62 132/63 139/62  Pulse: 70 65 75 62  Resp: 14  18 18   Temp: 97.6 F (36.4 C) 98.8 F (37.1 C) 98.3 F (36.8 C) 98 F (36.7 C)  TempSrc: Oral Oral Oral Oral  SpO2: 94% 97% 94% 94%    Intake/Output Summary (Last 24 hours) at 11/16/2022 0759 Last data filed at 11/15/2022 1900 Gross per 24 hour  Intake 380 ml  Output 780 ml  Net -400 ml   There were no vitals filed for this visit.  Examination:  General exam: Overall comfortable, not in distress HEENT: PERRL Respiratory system:  no wheezes or crackles  Cardiovascular system: S1 & S2 heard, RRR.  Gastrointestinal system: Abdomen is mildlly distended, soft and nontender. Central nervous system: Alert and oriented Extremities: No edema, no clubbing ,no cyanosis Skin: No rashes, no ulcers,no icterus     Data Reviewed: I have personally reviewed following labs and imaging  studies  CBC: Recent Labs  Lab 11/13/22 1750 11/14/22 0737 11/14/22 1613 11/15/22 0804 11/15/22 1646 11/16/22 0530  WBC 5.2  --  5.5 4.4 5.3 4.6  NEUTROABS 3.6  --  3.8 2.8 3.6 3.0  HGB 6.5* 9.0* 8.8* 9.2* 9.3* 8.3*  HCT 21.3* 28.0* 28.5* 29.8* 29.9* 27.4*  MCV 97.3  --  93.1 94.3 92.9 93.2  PLT 287  --  281 260 288 250   Basic Metabolic Panel: Recent Labs  Lab 11/13/22 1750 11/14/22 0737 11/15/22 0804  NA 134* 137 133*  K 3.1* 3.3* 3.8  CL 96* 96* 101  CO2 30 31 26   GLUCOSE 181* 140* 151*  BUN 34* 24* 15  CREATININE 1.13 1.02 1.00  CALCIUM 8.7* 8.6* 8.0*  MG  --  1.7  --   PHOS  --  3.5  --      No results found for this or any previous visit (from the past 240 hour(s)).   Radiology Studies: ECHOCARDIOGRAM COMPLETE  Result Date: 11/14/2022    ECHOCARDIOGRAM REPORT   Patient Name:   [name] [facility] Date of Exam: 11/14/2022 Medical Rec #:  [id number]                 Height:       65.0 in Accession #:    [id number]                Weight:       186.0 lb Date of Birth:  [birth year]                 BSA:          1.918 m Patient Age:    89 years                  BP:           147/67 mmHg Patient Gender: M                         HR:           58 bpm. Exam Location:  Inpatient Procedure: 2D Echo, Color Doppler and Cardiac Doppler Indications:    Chest Pain  History:        Patient has prior history of Echocardiogram examinations and                 Patient has no prior history of Echocardiogram examinations.                 Gastric Cancer; Risk Factors:Hypertension, Diabetes and                 Dyslipidemia.  Sonographer:    Milbert Coulter Referring Phys: [id number] RAVI PAHWANI IMPRESSIONS  1. Left ventricular ejection fraction, by estimation, is 60 to 65%. The left ventricle has normal function. The left ventricle has no regional wall motion abnormalities. There is mild concentric left ventricular hypertrophy of the basal-septal segment. Left ventricular diastolic parameters  are consistent with Grade I diastolic dysfunction (impaired relaxation).  2. Right ventricular systolic  function is normal. The right ventricular size is normal.  3. Left atrial size was mildly dilated.  4. The mitral valve is normal in structure. Trivial mitral valve regurgitation. No evidence of mitral stenosis.  5. The aortic valve is tricuspid. There is mild calcification of the aortic valve. Aortic valve regurgitation is not visualized. Aortic valve sclerosis/calcification is present, without any evidence of aortic stenosis.  6. The inferior vena cava is normal in size with greater than 50% respiratory variability, suggesting right atrial pressure of 3 mmHg. FINDINGS  Left Ventricle: Left ventricular ejection fraction, by estimation, is 60 to 65%. The left ventricle has normal function. The left ventricle has no regional wall motion abnormalities. The left ventricular internal cavity size was normal in size. There is  mild concentric left ventricular hypertrophy of the basal-septal segment. Left ventricular diastolic parameters are consistent with Grade I diastolic dysfunction (impaired relaxation). Right Ventricle: The right ventricular size is normal. No increase in right ventricular wall thickness. Right ventricular systolic function is normal. Left Atrium: Left atrial size was mildly dilated. Right Atrium: Right atrial size was normal in size. Pericardium: There is no evidence of pericardial effusion. Mitral Valve: The mitral valve is normal in structure. Trivial mitral valve regurgitation. No evidence of mitral valve stenosis. Tricuspid Valve: The tricuspid valve is normal in structure. Tricuspid valve regurgitation is not demonstrated. No evidence of tricuspid stenosis. Aortic Valve: The aortic valve is tricuspid. There is mild calcification of the aortic valve. Aortic valve regurgitation is not visualized. Aortic valve sclerosis/calcification is present, without any evidence of aortic stenosis. Aortic  valve mean gradient measures 4.0 mmHg. Aortic valve peak gradient measures 8.3 mmHg. Aortic valve area, by VTI measures 2.25 cm. Pulmonic Valve: The pulmonic valve was normal in structure. Pulmonic valve regurgitation is not visualized. No evidence of pulmonic stenosis. Aorta: The aortic root is normal in size and structure. Venous: The inferior vena cava is normal in size with greater than 50% respiratory variability, suggesting right atrial pressure of 3 mmHg. IAS/Shunts: No atrial level shunt detected by color flow Doppler.  LEFT VENTRICLE PLAX 2D LVIDd:         4.70 cm     Diastology LVIDs:         3.30 cm     LV e' medial:    7.72 cm/s LV PW:         1.20 cm     LV E/e' medial:  9.4 LV IVS:        1.20 cm     LV e' lateral:   9.03 cm/s LVOT diam:     2.00 cm     LV E/e' lateral: 8.1 LV SV:         77 LV SV Index:   40 LVOT Area:     3.14 cm  LV Volumes (MOD) LV vol d, MOD A2C: 42.4 ml LV vol d, MOD A4C: 72.3 ml LV vol s, MOD A2C: 19.1 ml LV vol s, MOD A4C: 26.0 ml LV SV MOD A2C:     23.3 ml LV SV MOD A4C:     72.3 ml LV SV MOD BP:      33.4 ml RIGHT VENTRICLE RV S prime:     12.60 cm/s TAPSE (M-mode): 2.3 cm LEFT ATRIUM             Index        RIGHT ATRIUM           Index LA diam:  4.60 cm 2.40 cm/m   RA Area:     12.40 cm LA Vol (A2C):   41.0 ml 21.38 ml/m  RA Volume:   24.40 ml  12.72 ml/m LA Vol (A4C):   67.9 ml 35.40 ml/m LA Biplane Vol: 54.6 ml 28.47 ml/m  AORTIC VALVE AV Area (Vmax):    2.42 cm AV Area (Vmean):   2.32 cm AV Area (VTI):     2.25 cm AV Vmax:           144.00 cm/s AV Vmean:          96.400 cm/s AV VTI:            0.340 m AV Peak Grad:      8.3 mmHg AV Mean Grad:      4.0 mmHg LVOT Vmax:         111.00 cm/s LVOT Vmean:        71.100 cm/s LVOT VTI:          0.244 m LVOT/AV VTI ratio: 0.72  AORTA Ao Root diam: 3.10 cm MITRAL VALVE MV Area (PHT): 3.30 cm     SHUNTS MV Decel Time: 230 msec     Systemic VTI:  0.24 m MV E velocity: 72.80 cm/s   Systemic Diam: 2.00 cm MV A  velocity: 109.00 cm/s MV E/A ratio:  0.67 Arvilla Meres MD Electronically signed by Arvilla Meres MD Signature Date/Time: 11/14/2022/2:24:04 PM    Final     Scheduled Meds:  sodium chloride   Intravenous Once   amLODipine  10 mg Oral Daily   buPROPion  300 mg Oral Daily   ferrous sulfate  325 mg Oral Q breakfast   gabapentin  800 mg Oral BID   LORazepam  0.5 mg Oral BID   pantoprazole  40 mg Oral BID   primidone  50 mg Oral BID   tamsulosin  0.8 mg Oral QPC supper   Continuous Infusions:   LOS: 3 days   [name], MD [facility] P5/19/2024, 7:59 AM

## 2022-11-17 DIAGNOSIS — K254 Chronic or unspecified gastric ulcer with hemorrhage: Secondary | ICD-10-CM | POA: Diagnosis not present

## 2022-11-17 LAB — BPAM RBC
ISSUE DATE / TIME: 202405170254
Unit Type and Rh: 6200
Unit Type and Rh: 6200

## 2022-11-17 LAB — TYPE AND SCREEN
Antibody Screen: NEGATIVE
Unit division: 0
Unit division: 0

## 2022-11-17 MED ORDER — FUROSEMIDE 20 MG PO TABS: 20.0000 mg | ORAL_TABLET | Freq: Every day | ORAL | 0 refills | Status: DC

## 2022-11-17 MED ORDER — POTASSIUM CHLORIDE CRYS ER 20 MEQ PO TBCR: 20.0000 meq | EXTENDED_RELEASE_TABLET | Freq: Every day | ORAL | 0 refills | Status: DC

## 2022-11-17 MED ORDER — LOSARTAN POTASSIUM 25 MG PO TABS: 25.0000 mg | ORAL_TABLET | Freq: Every day | ORAL | 0 refills | Status: DC

## 2022-11-17 MED ORDER — SENNOSIDES-DOCUSATE SODIUM 8.6-50 MG PO TABS: 1.0000 | ORAL_TABLET | Freq: Two times a day (BID) | ORAL | 0 refills | Status: AC

## 2022-11-17 MED ORDER — LIDOCAINE 5 % EX PTCH
1.0000 | MEDICATED_PATCH | CUTANEOUS | Status: DC
  Administered 2022-11-17: 1 via TRANSDERMAL
  Filled 2022-11-17: qty 1

## 2022-11-17 MED ORDER — FUROSEMIDE 20 MG PO TABS
20.0000 mg | ORAL_TABLET | Freq: Every day | ORAL | Status: DC
  Administered 2022-11-17: 20 mg via ORAL
  Filled 2022-11-17: qty 1

## 2022-11-17 MED ORDER — LIDOCAINE 5 % EX PTCH: 1.0000 | MEDICATED_PATCH | CUTANEOUS | 0 refills | Status: DC

## 2022-11-17 MED ORDER — POTASSIUM CHLORIDE CRYS ER 20 MEQ PO TBCR
20.0000 meq | EXTENDED_RELEASE_TABLET | Freq: Every day | ORAL | Status: DC
  Administered 2022-11-17: 20 meq via ORAL
  Filled 2022-11-17: qty 1

## 2022-11-17 MED ORDER — POLYETHYLENE GLYCOL 3350 17 G PO PACK: 17.0000 g | PACK | Freq: Every day | ORAL | 0 refills | Status: DC

## 2022-11-17 NOTE — Care Management Important Message (Signed)
Important Message  Patient Details IM Letter not given due to Hospice  Name: [name] MRN: [id number] Date of Birth: 1933/05/18   Medicare Important Message Given:  No     [name] 11/17/2022, 10:09 AM

## 2022-11-17 NOTE — Discharge Summary (Addendum)
Physician Discharge Summary  [name] ZOX:[id number] DOB: 06/06/1933 DOA: 11/13/2022  PCP: [name], NP  Admit date: 11/13/2022 Discharge date: 11/17/2022  Admitted From: ILF Disposition:  ALF with hospice  Discharge Condition:Stable CODE STATUS:DNR Diet recommendation: Full liquid  Brief/Interim Summary: Patient is a 89 year old male with past medical history of recent diagnosis of adenocarcinoma of the stomach, BPH, hypertension, GERD, hyperlipidemia who presented to the emergency department with complaint of chest pain. On presentation his hemoglobin was 6.5, troponin of 165, FOBT was positive. EKG showed ST depression in the anterior leads and troponins continue to rise. Cardiology consulted. Extensive goals of care discussed by palliative care due to his history of gastric cancer and CODE STATUS was changed to DNR. Current plan is to discharge him to ALF with hospice.   Following problems were addressed during the hospitalization:  Chest pain/epigastric pain: Presented with elevated troponin, EKG changes.  Chest CT on 4/24 showed three-vessel coronary artery disease.  Echo showed normal left ventricular systolic function.  Cardiology suspected demand ischemia in the setting of anemia.  Normal wall motion abnormality was seen on echo.  And he is not a candidate for anticoagulation.  Cardiology signed off.  Denies any chest pain today.   Upper GI bleed GI bleed: Likely secondary to gastric cancer.  FOBT was positive.  Hemoglobin was 6.1 on admission.  He was transfused with 2 units of PRBC.  GI was consulted.  Plan for monitoring.  Currently hemoglobin is stable in the range of 8-9.  Denies nausea or vomiting.  On full liquid diet.  GI has signed off.Started on protonix   Constipation: Having issues with constipation but denies any abdominal pain.  Continue MiraLAX, Senokot .  Ordered a dose of Dulcolax suppository    Hypertension/lower extremity edema: Amlodipine  discontinued.  Started on losartan for hypertension Started on low-dose Lasix for lower extremity edema.  Lower extremity edema is also likely contributed by malignancy, low albumin   BPH: Flomax   Hypokalemia: Continue supplementation   Goals of Care: Palliative care   following.  CODE STATUS changed to DNR.  Plan is for hospice approach.  TOC assisting with discharge to ALF with hospice.    Discharge Diagnoses:  Principal Problem:   GIB (gastrointestinal bleeding) Active Problems:   Essential hypertension, benign   Gastric ulcer without hemorrhage or perforation   Gastric cancer (HCC)   Chest pain   ABLA (acute blood loss anemia)   Palliative care by specialist   Elevated troponin   Symptomatic anemia   General weakness    Discharge Instructions  Discharge Instructions     Diet general   Complete by: As directed    Full liquid diet   Discharge instructions   Complete by: As directed    1)Follow up with hospice services   Increase activity slowly   Complete by: As directed       Allergies as of 11/17/2022   No Known Allergies      Medication List     STOP taking these medications    amLODipine 10 MG tablet Commonly known as: NORVASC   hydrochlorothiazide 25 MG tablet Commonly known as: HYDRODIURIL   omeprazole 40 MG capsule Commonly known as: PRILOSEC   temazepam 30 MG capsule Commonly known as: RESTORIL       TAKE these medications    acetaminophen 500 MG tablet Commonly known as: TYLENOL Take 500 mg by mouth every 6 (six) hours as needed for moderate pain or mild  pain.   buPROPion 300 MG 24 hr tablet Commonly known as: WELLBUTRIN XL Take 300 mg by mouth daily.   chlorhexidine 0.12 % solution Commonly known as: PERIDEX Use as directed 15 mLs in the mouth or throat 2 (two) times daily.   ferrous sulfate 325 (65 FE) MG EC tablet Take 1 tablet (325 mg total) by mouth daily with breakfast.   Fish Oil 1000 MG Caps Take 1,000 mg by mouth  daily.   furosemide 20 MG tablet Commonly known as: LASIX Take 1 tablet (20 mg total) by mouth daily.   gabapentin 800 MG tablet Commonly known as: NEURONTIN Take 800 mg by mouth 2 (two) times daily.   glipiZIDE 2.5 MG 24 hr tablet Commonly known as: GLUCOTROL XL Take 2.5 mg by mouth daily with breakfast.   lidocaine 5 % Commonly known as: LIDODERM Place 1 patch onto the skin daily. Remove & Discard patch within 12 hours or as directed by MD ,Apply on right knee   loratadine 10 MG tablet Commonly known as: CLARITIN Take 10 mg by mouth daily.   LORazepam 0.5 MG tablet Commonly known as: ATIVAN Take 1-2 tablets (0.5-1 mg total) by mouth at bedtime. Take 0.5 mg (1 tablet) by mouth every night at bedtime. May take an additional 0.5 mg (1 tablet) as needed.   losartan 25 MG tablet Commonly known as: COZAAR Take 1 tablet (25 mg total) by mouth daily. Start taking on: Nov 18, 2022   morphine 15 MG tablet Commonly known as: MSIR Take 1 tablet (15 mg total) by mouth every 6 (six) hours as needed for severe pain.   multivitamin with minerals Tabs tablet Take 1 tablet by mouth daily.   pantoprazole 40 MG tablet Commonly known as: PROTONIX Take 1 tablet (40 mg total) by mouth 2 (two) times daily.   polyethylene glycol 17 g packet Commonly known as: MIRALAX / GLYCOLAX Take 17 g by mouth daily. Start taking on: Nov 18, 2022   potassium chloride SA 20 MEQ tablet Commonly known as: KLOR-CON M Take 1 tablet (20 mEq total) by mouth daily.   primidone 50 MG tablet Commonly known as: MYSOLINE Take 50 mg by mouth 2 (two) times daily.   senna-docusate 8.6-50 MG tablet Commonly known as: Senokot-S Take 1 tablet by mouth 2 (two) times daily.   tamsulosin 0.4 MG Caps capsule Commonly known as: FLOMAX Take 0.8 mg by mouth daily after supper.        Follow-up Information     [name], NP Follow up in 1 week(s).   Specialty: Nurse Practitioner Contact information: [address] [place] [address] [phone]                No Known Allergies  Consultations: GI   Procedures/Studies: ECHOCARDIOGRAM COMPLETE  Result Date: 11/14/2022    ECHOCARDIOGRAM REPORT   Patient Name:   [name] [facility] Date of Exam: 11/14/2022 Medical Rec #:  [id number]                 Height:       65.0 in Accession #:    [id number]                Weight:       186.0 lb Date of Birth:  Aug 26, 1932                 BSA:          1.918 m Patient Age:  89 years                  BP:           147/67 mmHg Patient Gender: M                         HR:           58 bpm. Exam Location:  Inpatient Procedure: 2D Echo, Color Doppler and Cardiac Doppler Indications:    Chest Pain  History:        Patient has prior history of Echocardiogram examinations and                 Patient has no prior history of Echocardiogram examinations.                 Gastric Cancer; Risk Factors:Hypertension, Diabetes and                 Dyslipidemia.  Sonographer:    Milbert Coulter Referring Phys: [id number] RAVI PAHWANI IMPRESSIONS  1. Left ventricular ejection fraction, by estimation, is 60 to 65%. The left ventricle has normal function. The left ventricle has no regional wall motion abnormalities. There is mild concentric left ventricular hypertrophy of the basal-septal segment. Left ventricular diastolic parameters are consistent with Grade I diastolic dysfunction (impaired relaxation).  2. Right ventricular systolic function is normal. The right ventricular size is normal.  3. Left atrial size was mildly dilated.  4. The mitral valve is normal in structure. Trivial mitral valve regurgitation. No evidence of mitral stenosis.  5. The aortic valve is tricuspid. There is mild calcification of the aortic valve. Aortic valve regurgitation is not visualized. Aortic valve sclerosis/calcification is present, without any evidence of aortic stenosis.  6. The inferior vena cava is normal in size with greater than 50%  respiratory variability, suggesting right atrial pressure of 3 mmHg. FINDINGS  Left Ventricle: Left ventricular ejection fraction, by estimation, is 60 to 65%. The left ventricle has normal function. The left ventricle has no regional wall motion abnormalities. The left ventricular internal cavity size was normal in size. There is  mild concentric left ventricular hypertrophy of the basal-septal segment. Left ventricular diastolic parameters are consistent with Grade I diastolic dysfunction (impaired relaxation). Right Ventricle: The right ventricular size is normal. No increase in right ventricular wall thickness. Right ventricular systolic function is normal. Left Atrium: Left atrial size was mildly dilated. Right Atrium: Right atrial size was normal in size. Pericardium: There is no evidence of pericardial effusion. Mitral Valve: The mitral valve is normal in structure. Trivial mitral valve regurgitation. No evidence of mitral valve stenosis. Tricuspid Valve: The tricuspid valve is normal in structure. Tricuspid valve regurgitation is not demonstrated. No evidence of tricuspid stenosis. Aortic Valve: The aortic valve is tricuspid. There is mild calcification of the aortic valve. Aortic valve regurgitation is not visualized. Aortic valve sclerosis/calcification is present, without any evidence of aortic stenosis. Aortic valve mean gradient measures 4.0 mmHg. Aortic valve peak gradient measures 8.3 mmHg. Aortic valve area, by VTI measures 2.25 cm. Pulmonic Valve: The pulmonic valve was normal in structure. Pulmonic valve regurgitation is not visualized. No evidence of pulmonic stenosis. Aorta: The aortic root is normal in size and structure. Venous: The inferior vena cava is normal in size with greater than 50% respiratory variability, suggesting right atrial pressure of 3 mmHg. IAS/Shunts: No atrial level shunt detected by color flow Doppler.  LEFT VENTRICLE PLAX 2D  LVIDd:         4.70 cm     Diastology LVIDs:          3.30 cm     LV e' medial:    7.72 cm/s LV PW:         1.20 cm     LV E/e' medial:  9.4 LV IVS:        1.20 cm     LV e' lateral:   9.03 cm/s LVOT diam:     2.00 cm     LV E/e' lateral: 8.1 LV SV:         77 LV SV Index:   40 LVOT Area:     3.14 cm  LV Volumes (MOD) LV vol d, MOD A2C: 42.4 ml LV vol d, MOD A4C: 72.3 ml LV vol s, MOD A2C: 19.1 ml LV vol s, MOD A4C: 26.0 ml LV SV MOD A2C:     23.3 ml LV SV MOD A4C:     72.3 ml LV SV MOD BP:      33.4 ml RIGHT VENTRICLE RV S prime:     12.60 cm/s TAPSE (M-mode): 2.3 cm LEFT ATRIUM             Index        RIGHT ATRIUM           Index LA diam:        4.60 cm 2.40 cm/m   RA Area:     12.40 cm LA Vol (A2C):   41.0 ml 21.38 ml/m  RA Volume:   24.40 ml  12.72 ml/m LA Vol (A4C):   67.9 ml 35.40 ml/m LA Biplane Vol: 54.6 ml 28.47 ml/m  AORTIC VALVE AV Area (Vmax):    2.42 cm AV Area (Vmean):   2.32 cm AV Area (VTI):     2.25 cm AV Vmax:           144.00 cm/s AV Vmean:          96.400 cm/s AV VTI:            0.340 m AV Peak Grad:      8.3 mmHg AV Mean Grad:      4.0 mmHg LVOT Vmax:         111.00 cm/s LVOT Vmean:        71.100 cm/s LVOT VTI:          0.244 m LVOT/AV VTI ratio: 0.72  AORTA Ao Root diam: 3.10 cm MITRAL VALVE MV Area (PHT): 3.30 cm     SHUNTS MV Decel Time: 230 msec     Systemic VTI:  0.24 m MV E velocity: 72.80 cm/s   Systemic Diam: 2.00 cm MV A velocity: 109.00 cm/s MV E/A ratio:  0.67 Arvilla Meres MD Electronically signed by Arvilla Meres MD Signature Date/Time: 11/14/2022/2:24:04 PM    Final    CT Head Wo Contrast  Result Date: 11/13/2022 CLINICAL DATA:  Neuro deficit, acute, stroke suspected, vision change EXAM: CT HEAD WITHOUT CONTRAST TECHNIQUE: Contiguous axial images were obtained from the base of the skull through the vertex without intravenous contrast. RADIATION DOSE REDUCTION: This exam was performed according to the departmental dose-optimization program which includes automated exposure control, adjustment of the mA and/or  kV according to patient size and/or use of iterative reconstruction technique. COMPARISON:  10/18/2004 FINDINGS: Brain: Mild parenchymal volume loss is present, commensurate with the patient's age and stable since prior examination. Mild ventriculomegaly has developed, slightly  disproportionate to the degree of parenchymal volume loss, possibly reflecting changes of asymmetric central atrophy or communicating hydrocephalus. No acute intracranial hemorrhage or infarct. No abnormal mass effect or midline shift. No abnormal intra or extra-axial mass lesion or fluid collection. Cerebellum is unremarkable. Vascular: No hyperdense vessel or unexpected calcification. Skull: Normal. Negative for fracture or focal lesion. Sinuses/Orbits: No acute finding. Other: Mastoid air cells and middle ear cavities are clear. IMPRESSION: 1. No acute intracranial hemorrhage or infarct. 2. Interval development of mild ventriculomegaly, possibly reflecting changes of asymmetric central atrophy or communicating hydrocephalus. Electronically Signed   By: Helyn Numbers M.D.   On: 11/13/2022 17:58   DG Chest Port 1 View  Result Date: 11/13/2022 CLINICAL DATA:  Chest pain EXAM: PORTABLE CHEST 1 VIEW COMPARISON:  Chest x-ray 08/01/2019 FINDINGS: The heart size and mediastinal contours are within normal limits. Both lungs are clear. The visualized skeletal structures are unremarkable. IMPRESSION: No active disease. Electronically Signed   By: Darliss Cheney M.D.   On: 11/13/2022 17:37       Discharge Exam: Vitals:   11/16/22 2007 11/17/22 0436  BP: (!) 157/58 135/73  Pulse: 63 85  Resp: 18 18  Temp: 98.2 F (36.8 C) 98.1 F (36.7 C)  SpO2: 97% 91%   Vitals:   11/16/22 0505 11/16/22 1319 11/16/22 2007 11/17/22 0436  BP: 139/62 (!) 133/59 (!) 157/58 135/73  Pulse: 62 (!) 55 63 85  Resp: 18  18 18   Temp: 98 F (36.7 C) 97.9 F (36.6 C) 98.2 F (36.8 C) 98.1 F (36.7 C)  TempSrc: Oral Oral Oral Oral  SpO2: 94% 100% 97%  91%    General: Pt is alert, awake, not in acute distress Cardiovascular: RRR, S1/S2 +, no rubs, no gallops Respiratory: CTA bilaterally, no wheezing, no rhonchi Abdominal: Soft, NT, midly distended, bowel sounds + Extremities: no edema, no cyanosis    The results of significant diagnostics from this hospitalization (including imaging, microbiology, ancillary and laboratory) are listed below for reference.     Microbiology: No results found for this or any previous visit (from the past 240 hour(s)).   Labs: BNP (last 3 results) No results for input(s): "BNP" in the last 8760 hours. Basic Metabolic Panel: Recent Labs  Lab 11/13/22 1750 11/14/22 0737 11/15/22 0804  NA 134* 137 133*  K 3.1* 3.3* 3.8  CL 96* 96* 101  CO2 30 31 26   GLUCOSE 181* 140* 151*  BUN 34* 24* 15  CREATININE 1.13 1.02 1.00  CALCIUM 8.7* 8.6* 8.0*  MG  --  1.7  --   PHOS  --  3.5  --    Liver Function Tests: Recent Labs  Lab 11/13/22 1750  AST 20  ALT 21  ALKPHOS 50  BILITOT 0.4  PROT 5.4*  ALBUMIN 2.9*   Recent Labs  Lab 11/13/22 1750  LIPASE 35   No results for input(s): "AMMONIA" in the last 168 hours. CBC: Recent Labs  Lab 11/13/22 1750 11/14/22 0737 11/14/22 1613 11/15/22 0804 11/15/22 1646 11/16/22 0530  WBC 5.2  --  5.5 4.4 5.3 4.6  NEUTROABS 3.6  --  3.8 2.8 3.6 3.0  HGB 6.5* 9.0* 8.8* 9.2* 9.3* 8.3*  HCT 21.3* 28.0* 28.5* 29.8* 29.9* 27.4*  MCV 97.3  --  93.1 94.3 92.9 93.2  PLT 287  --  281 260 288 250   Cardiac Enzymes: No results for input(s): "CKTOTAL", "CKMB", "CKMBINDEX", "TROPONINI" in the last 168 hours. BNP: Invalid input(s): "POCBNP" CBG: Recent  Labs  Lab 11/13/22 2332  GLUCAP 158*   D-Dimer No results for input(s): "DDIMER" in the last 72 hours. Hgb A1c No results for input(s): "HGBA1C" in the last 72 hours. Lipid Profile Recent Labs    11/15/22 0804  CHOL 153  HDL 39*  LDLCALC 97  TRIG 87  CHOLHDL 3.9   Thyroid function studies No  results for input(s): "TSH", "T4TOTAL", "T3FREE", "THYROIDAB" in the last 72 hours.  Invalid input(s): "FREET3" Anemia work up No results for input(s): "VITAMINB12", "FOLATE", "FERRITIN", "TIBC", "IRON", "RETICCTPCT" in the last 72 hours. Urinalysis    Component Value Date/Time   COLORURINE STRAW (A) 11/13/2022 1854   APPEARANCEUR CLEAR 11/13/2022 1854   LABSPEC 1.008 11/13/2022 1854   PHURINE 6.0 11/13/2022 1854   GLUCOSEU NEGATIVE 11/13/2022 1854   HGBUR NEGATIVE 11/13/2022 1854   BILIRUBINUR NEGATIVE 11/13/2022 1854   BILIRUBINUR neg 08/04/2011 2026   KETONESUR NEGATIVE 11/13/2022 1854   PROTEINUR NEGATIVE 11/13/2022 1854   UROBILINOGEN 0.2 03/29/2012 1438   NITRITE NEGATIVE 11/13/2022 1854   LEUKOCYTESUR NEGATIVE 11/13/2022 1854   Sepsis Labs Recent Labs  Lab 11/14/22 1613 11/15/22 0804 11/15/22 1646 11/16/22 0530  WBC 5.5 4.4 5.3 4.6   Microbiology No results found for this or any previous visit (from the past 240 hour(s)).  Please note: You were cared for by a hospitalist during your hospital stay. Once you are discharged, your primary care physician will handle any further medical issues. Please note that NO REFILLS for any discharge medications will be authorized once you are discharged, as it is imperative that you return to your primary care physician (or establish a relationship with a primary care physician if you do not have one) for your post hospital discharge needs so that they can reassess your need for medications and monitor your lab values.    Time coordinating discharge: 40 minutes  SIGNED:   [name], MD  [facility] 11/17/2022, 11:44 AM Pager [phone]  If 7PM-7AM, please contact night-coverage [email or web address] [facility] Physician Discharge Summary  [name] EXB:[id number] DOB: 1933-06-04 DOA: 11/13/2022  PCP: [name], NP  Admit date: 11/13/2022 Discharge date: 11/17/2022  Admitted From:  Home Disposition:  Home  Discharge Condition:Stable CODE STATUS:FULL, DNR, Comfort Care Diet recommendation: Heart Healthy / Carb Modified / Regular / Dysphagia   Brief/Interim Summary:   Following problems were addressed during the hospitalization:   Discharge Diagnoses:  Principal Problem:   GIB (gastrointestinal bleeding) Active Problems:   Essential hypertension, benign   Gastric ulcer without hemorrhage or perforation   Gastric cancer (HCC)   Chest pain   ABLA (acute blood loss anemia)   Palliative care by specialist   Elevated troponin   Symptomatic anemia   General weakness    Discharge Instructions  Discharge Instructions     Diet general   Complete by: As directed    Full liquid diet   Discharge instructions   Complete by: As directed    1)Follow up with hospice services   Increase activity slowly   Complete by: As directed       Allergies as of 11/17/2022   No Known Allergies      Medication List     STOP taking these medications    amLODipine 10 MG tablet Commonly known as: NORVASC   hydrochlorothiazide 25 MG tablet Commonly known as: HYDRODIURIL   omeprazole 40 MG capsule Commonly known as: PRILOSEC   temazepam 30 MG capsule Commonly known as: RESTORIL  TAKE these medications    acetaminophen 500 MG tablet Commonly known as: TYLENOL Take 500 mg by mouth every 6 (six) hours as needed for moderate pain or mild pain.   buPROPion 300 MG 24 hr tablet Commonly known as: WELLBUTRIN XL Take 300 mg by mouth daily.   chlorhexidine 0.12 % solution Commonly known as: PERIDEX Use as directed 15 mLs in the mouth or throat 2 (two) times daily.   ferrous sulfate 325 (65 FE) MG EC tablet Take 1 tablet (325 mg total) by mouth daily with breakfast.   Fish Oil 1000 MG Caps Take 1,000 mg by mouth daily.   furosemide 20 MG tablet Commonly known as: LASIX Take 1 tablet (20 mg total) by mouth daily.   gabapentin 800 MG  tablet Commonly known as: NEURONTIN Take 800 mg by mouth 2 (two) times daily.   glipiZIDE 2.5 MG 24 hr tablet Commonly known as: GLUCOTROL XL Take 2.5 mg by mouth daily with breakfast.   lidocaine 5 % Commonly known as: LIDODERM Place 1 patch onto the skin daily. Remove & Discard patch within 12 hours or as directed by MD ,Apply on right knee   loratadine 10 MG tablet Commonly known as: CLARITIN Take 10 mg by mouth daily.   LORazepam 0.5 MG tablet Commonly known as: ATIVAN Take 1-2 tablets (0.5-1 mg total) by mouth at bedtime. Take 0.5 mg (1 tablet) by mouth every night at bedtime. May take an additional 0.5 mg (1 tablet) as needed.   losartan 25 MG tablet Commonly known as: COZAAR Take 1 tablet (25 mg total) by mouth daily. Start taking on: Nov 18, 2022   morphine 15 MG tablet Commonly known as: MSIR Take 1 tablet (15 mg total) by mouth every 6 (six) hours as needed for severe pain.   multivitamin with minerals Tabs tablet Take 1 tablet by mouth daily.   pantoprazole 40 MG tablet Commonly known as: PROTONIX Take 1 tablet (40 mg total) by mouth 2 (two) times daily.   polyethylene glycol 17 g packet Commonly known as: MIRALAX / GLYCOLAX Take 17 g by mouth daily. Start taking on: Nov 18, 2022   potassium chloride SA 20 MEQ tablet Commonly known as: KLOR-CON M Take 1 tablet (20 mEq total) by mouth daily.   primidone 50 MG tablet Commonly known as: MYSOLINE Take 50 mg by mouth 2 (two) times daily.   senna-docusate 8.6-50 MG tablet Commonly known as: Senokot-S Take 1 tablet by mouth 2 (two) times daily.   tamsulosin 0.4 MG Caps capsule Commonly known as: FLOMAX Take 0.8 mg by mouth daily after supper.        Follow-up Information     [name], NP Follow up in 1 week(s).   Specialty: Nurse Practitioner Contact information: [address] [place] [address] [phone]                No Known  Allergies  Consultations:    Procedures/Studies: ECHOCARDIOGRAM COMPLETE  Result Date: 11/14/2022    ECHOCARDIOGRAM REPORT   Patient Name:   [name] [facility] Date of Exam: 11/14/2022 Medical Rec #:  [id number]                 Height:       65.0 in Accession #:    [id number]                Weight:       186.0 lb Date of Birth:  07/17/1932  BSA:          1.918 m Patient Age:    89 years                  BP:           147/67 mmHg Patient Gender: M                         HR:           58 bpm. Exam Location:  Inpatient Procedure: 2D Echo, Color Doppler and Cardiac Doppler Indications:    Chest Pain  History:        Patient has prior history of Echocardiogram examinations and                 Patient has no prior history of Echocardiogram examinations.                 Gastric Cancer; Risk Factors:Hypertension, Diabetes and                 Dyslipidemia.  Sonographer:    Milbert Coulter Referring Phys: [id number] RAVI PAHWANI IMPRESSIONS  1. Left ventricular ejection fraction, by estimation, is 60 to 65%. The left ventricle has normal function. The left ventricle has no regional wall motion abnormalities. There is mild concentric left ventricular hypertrophy of the basal-septal segment. Left ventricular diastolic parameters are consistent with Grade I diastolic dysfunction (impaired relaxation).  2. Right ventricular systolic function is normal. The right ventricular size is normal.  3. Left atrial size was mildly dilated.  4. The mitral valve is normal in structure. Trivial mitral valve regurgitation. No evidence of mitral stenosis.  5. The aortic valve is tricuspid. There is mild calcification of the aortic valve. Aortic valve regurgitation is not visualized. Aortic valve sclerosis/calcification is present, without any evidence of aortic stenosis.  6. The inferior vena cava is normal in size with greater than 50% respiratory variability, suggesting right atrial pressure of 3 mmHg. FINDINGS  Left  Ventricle: Left ventricular ejection fraction, by estimation, is 60 to 65%. The left ventricle has normal function. The left ventricle has no regional wall motion abnormalities. The left ventricular internal cavity size was normal in size. There is  mild concentric left ventricular hypertrophy of the basal-septal segment. Left ventricular diastolic parameters are consistent with Grade I diastolic dysfunction (impaired relaxation). Right Ventricle: The right ventricular size is normal. No increase in right ventricular wall thickness. Right ventricular systolic function is normal. Left Atrium: Left atrial size was mildly dilated. Right Atrium: Right atrial size was normal in size. Pericardium: There is no evidence of pericardial effusion. Mitral Valve: The mitral valve is normal in structure. Trivial mitral valve regurgitation. No evidence of mitral valve stenosis. Tricuspid Valve: The tricuspid valve is normal in structure. Tricuspid valve regurgitation is not demonstrated. No evidence of tricuspid stenosis. Aortic Valve: The aortic valve is tricuspid. There is mild calcification of the aortic valve. Aortic valve regurgitation is not visualized. Aortic valve sclerosis/calcification is present, without any evidence of aortic stenosis. Aortic valve mean gradient measures 4.0 mmHg. Aortic valve peak gradient measures 8.3 mmHg. Aortic valve area, by VTI measures 2.25 cm. Pulmonic Valve: The pulmonic valve was normal in structure. Pulmonic valve regurgitation is not visualized. No evidence of pulmonic stenosis. Aorta: The aortic root is normal in size and structure. Venous: The inferior vena cava is normal in size with greater than 50% respiratory variability, suggesting right atrial pressure of  3 mmHg. IAS/Shunts: No atrial level shunt detected by color flow Doppler.  LEFT VENTRICLE PLAX 2D LVIDd:         4.70 cm     Diastology LVIDs:         3.30 cm     LV e' medial:    7.72 cm/s LV PW:         1.20 cm     LV E/e'  medial:  9.4 LV IVS:        1.20 cm     LV e' lateral:   9.03 cm/s LVOT diam:     2.00 cm     LV E/e' lateral: 8.1 LV SV:         77 LV SV Index:   40 LVOT Area:     3.14 cm  LV Volumes (MOD) LV vol d, MOD A2C: 42.4 ml LV vol d, MOD A4C: 72.3 ml LV vol s, MOD A2C: 19.1 ml LV vol s, MOD A4C: 26.0 ml LV SV MOD A2C:     23.3 ml LV SV MOD A4C:     72.3 ml LV SV MOD BP:      33.4 ml RIGHT VENTRICLE RV S prime:     12.60 cm/s TAPSE (M-mode): 2.3 cm LEFT ATRIUM             Index        RIGHT ATRIUM           Index LA diam:        4.60 cm 2.40 cm/m   RA Area:     12.40 cm LA Vol (A2C):   41.0 ml 21.38 ml/m  RA Volume:   24.40 ml  12.72 ml/m LA Vol (A4C):   67.9 ml 35.40 ml/m LA Biplane Vol: 54.6 ml 28.47 ml/m  AORTIC VALVE AV Area (Vmax):    2.42 cm AV Area (Vmean):   2.32 cm AV Area (VTI):     2.25 cm AV Vmax:           144.00 cm/s AV Vmean:          96.400 cm/s AV VTI:            0.340 m AV Peak Grad:      8.3 mmHg AV Mean Grad:      4.0 mmHg LVOT Vmax:         111.00 cm/s LVOT Vmean:        71.100 cm/s LVOT VTI:          0.244 m LVOT/AV VTI ratio: 0.72  AORTA Ao Root diam: 3.10 cm MITRAL VALVE MV Area (PHT): 3.30 cm     SHUNTS MV Decel Time: 230 msec     Systemic VTI:  0.24 m MV E velocity: 72.80 cm/s   Systemic Diam: 2.00 cm MV A velocity: 109.00 cm/s MV E/A ratio:  0.67 Arvilla Meres MD Electronically signed by Arvilla Meres MD Signature Date/Time: 11/14/2022/2:24:04 PM    Final    CT Head Wo Contrast  Result Date: 11/13/2022 CLINICAL DATA:  Neuro deficit, acute, stroke suspected, vision change EXAM: CT HEAD WITHOUT CONTRAST TECHNIQUE: Contiguous axial images were obtained from the base of the skull through the vertex without intravenous contrast. RADIATION DOSE REDUCTION: This exam was performed according to the departmental dose-optimization program which includes automated exposure control, adjustment of the mA and/or kV according to patient size and/or use of iterative reconstruction technique.  COMPARISON:  10/18/2004 FINDINGS: Brain: Mild parenchymal volume loss  is present, commensurate with the patient's age and stable since prior examination. Mild ventriculomegaly has developed, slightly disproportionate to the degree of parenchymal volume loss, possibly reflecting changes of asymmetric central atrophy or communicating hydrocephalus. No acute intracranial hemorrhage or infarct. No abnormal mass effect or midline shift. No abnormal intra or extra-axial mass lesion or fluid collection. Cerebellum is unremarkable. Vascular: No hyperdense vessel or unexpected calcification. Skull: Normal. Negative for fracture or focal lesion. Sinuses/Orbits: No acute finding. Other: Mastoid air cells and middle ear cavities are clear. IMPRESSION: 1. No acute intracranial hemorrhage or infarct. 2. Interval development of mild ventriculomegaly, possibly reflecting changes of asymmetric central atrophy or communicating hydrocephalus. Electronically Signed   By: Helyn Numbers M.D.   On: 11/13/2022 17:58   DG Chest Port 1 View  Result Date: 11/13/2022 CLINICAL DATA:  Chest pain EXAM: PORTABLE CHEST 1 VIEW COMPARISON:  Chest x-ray 08/01/2019 FINDINGS: The heart size and mediastinal contours are within normal limits. Both lungs are clear. The visualized skeletal structures are unremarkable. IMPRESSION: No active disease. Electronically Signed   By: Darliss Cheney M.D.   On: 11/13/2022 17:37      Subjective:   Discharge Exam: Vitals:   11/16/22 2007 11/17/22 0436  BP: (!) 157/58 135/73  Pulse: 63 85  Resp: 18 18  Temp: 98.2 F (36.8 C) 98.1 F (36.7 C)  SpO2: 97% 91%   Vitals:   11/16/22 0505 11/16/22 1319 11/16/22 2007 11/17/22 0436  BP: 139/62 (!) 133/59 (!) 157/58 135/73  Pulse: 62 (!) 55 63 85  Resp: 18  18 18   Temp: 98 F (36.7 C) 97.9 F (36.6 C) 98.2 F (36.8 C) 98.1 F (36.7 C)  TempSrc: Oral Oral Oral Oral  SpO2: 94% 100% 97% 91%    General: Pt is alert, awake, not in acute  distress Cardiovascular: RRR, S1/S2 +, no rubs, no gallops Respiratory: CTA bilaterally, no wheezing, no rhonchi Abdominal: Soft, NT, ND, bowel sounds + Extremities: no edema, no cyanosis    The results of significant diagnostics from this hospitalization (including imaging, microbiology, ancillary and laboratory) are listed below for reference.     Microbiology: No results found for this or any previous visit (from the past 240 hour(s)).   Labs: BNP (last 3 results) No results for input(s): "BNP" in the last 8760 hours. Basic Metabolic Panel: Recent Labs  Lab 11/13/22 1750 11/14/22 0737 11/15/22 0804  NA 134* 137 133*  K 3.1* 3.3* 3.8  CL 96* 96* 101  CO2 30 31 26   GLUCOSE 181* 140* 151*  BUN 34* 24* 15  CREATININE 1.13 1.02 1.00  CALCIUM 8.7* 8.6* 8.0*  MG  --  1.7  --   PHOS  --  3.5  --    Liver Function Tests: Recent Labs  Lab 11/13/22 1750  AST 20  ALT 21  ALKPHOS 50  BILITOT 0.4  PROT 5.4*  ALBUMIN 2.9*   Recent Labs  Lab 11/13/22 1750  LIPASE 35   No results for input(s): "AMMONIA" in the last 168 hours. CBC: Recent Labs  Lab 11/13/22 1750 11/14/22 0737 11/14/22 1613 11/15/22 0804 11/15/22 1646 11/16/22 0530  WBC 5.2  --  5.5 4.4 5.3 4.6  NEUTROABS 3.6  --  3.8 2.8 3.6 3.0  HGB 6.5* 9.0* 8.8* 9.2* 9.3* 8.3*  HCT 21.3* 28.0* 28.5* 29.8* 29.9* 27.4*  MCV 97.3  --  93.1 94.3 92.9 93.2  PLT 287  --  281 260 288 250   Cardiac Enzymes: No  results for input(s): "CKTOTAL", "CKMB", "CKMBINDEX", "TROPONINI" in the last 168 hours. BNP: Invalid input(s): "POCBNP" CBG: Recent Labs  Lab 11/13/22 2332  GLUCAP 158*   D-Dimer No results for input(s): "DDIMER" in the last 72 hours. Hgb A1c No results for input(s): "HGBA1C" in the last 72 hours. Lipid Profile Recent Labs    11/15/22 0804  CHOL 153  HDL 39*  LDLCALC 97  TRIG 87  CHOLHDL 3.9   Thyroid function studies No results for input(s): "TSH", "T4TOTAL", "T3FREE", "THYROIDAB" in the  last 72 hours.  Invalid input(s): "FREET3" Anemia work up No results for input(s): "VITAMINB12", "FOLATE", "FERRITIN", "TIBC", "IRON", "RETICCTPCT" in the last 72 hours. Urinalysis    Component Value Date/Time   COLORURINE STRAW (A) 11/13/2022 1854   APPEARANCEUR CLEAR 11/13/2022 1854   LABSPEC 1.008 11/13/2022 1854   PHURINE 6.0 11/13/2022 1854   GLUCOSEU NEGATIVE 11/13/2022 1854   HGBUR NEGATIVE 11/13/2022 1854   BILIRUBINUR NEGATIVE 11/13/2022 1854   BILIRUBINUR neg 08/04/2011 2026   KETONESUR NEGATIVE 11/13/2022 1854   PROTEINUR NEGATIVE 11/13/2022 1854   UROBILINOGEN 0.2 03/29/2012 1438   NITRITE NEGATIVE 11/13/2022 1854   LEUKOCYTESUR NEGATIVE 11/13/2022 1854   Sepsis Labs Recent Labs  Lab 11/14/22 1613 11/15/22 0804 11/15/22 1646 11/16/22 0530  WBC 5.5 4.4 5.3 4.6   Microbiology No results found for this or any previous visit (from the past 240 hour(s)).  Please note: You were cared for by a hospitalist during your hospital stay. Once you are discharged, your primary care physician will handle any further medical issues. Please note that NO REFILLS for any discharge medications will be authorized once you are discharged, as it is imperative that you return to your primary care physician (or establish a relationship with a primary care physician if you do not have one) for your post hospital discharge needs so that they can reassess your need for medications and monitor your lab values.    Time coordinating discharge: 40 minutes  SIGNED:   [name], MD  [facility] 11/17/2022, 11:44 AM Pager [phone]  If 7PM-7AM, please contact night-coverage [email or web address] Password [email or web address]

## 2022-11-17 NOTE — Progress Notes (Signed)
PROGRESS NOTE  [name]  ZOX:[id number] DOB: 1932-10-03 DOA: 11/13/2022 PCP: [name], NP   Brief Narrative: Patient is a 89 year old male with past medical history of recent diagnosis of adenocarcinoma of the stomach, BPH, hypertension, GERD, hyperlipidemia who presented to the emergency department with complaint of chest pain.  On presentation his hemoglobin was 6.5, troponin of 165, FOBT was positive.  EKG showed ST depression in the anterior leads and troponins continue to rise.  Cardiology consulted.  Extensive goals of care discussed by palliative care due to his history of gastric cancer and CODE STATUS was changed to DNR.  Current plan is to discharge him to ALF with hospice.  TOC following.   Assessment & Plan:  Principal Problem:   GIB (gastrointestinal bleeding) Active Problems:   Essential hypertension, benign   Gastric ulcer without hemorrhage or perforation   Gastric cancer (HCC)   Chest pain   ABLA (acute blood loss anemia)   Palliative care by specialist   Elevated troponin   Symptomatic anemia   General weakness  Chest pain/epigastric pain: Presented with elevated troponin, EKG changes.  Chest CT on 4/24 showed three-vessel coronary artery disease.  Echo showed normal left ventricular systolic function.  Cardiology suspected demand ischemia in the setting of anemia.  Normal wall motion abnormality was seen on echo.  And he is not a candidate for anticoagulation.  Cardiology signed off.  Denies any chest pain today.  Upper GI bleed GI bleed: Likely secondary to gastric cancer.  FOBT was positive.  Hemoglobin was 6.1 on admission.  He was transfused with 2 units of PRBC.  GI was consulted.  Plan for monitoring.  Currently hemoglobin is stable in the range of 8-9.  Denies nausea or vomiting.  On full liquid diet.  GI has signed off  Constipation: Having issues with constipation but denies any abdominal pain.  Continue MiraLAX, Senokot .  Ordered a dose of  Dulcolax suppository   Hypertension/lower extremity edema: Amlodipine discontinued.  Started on losartan for hypertension Started on low-dose Lasix for lower extremity edema.  Lower extremity edema is also likely contributed by malignancy, low albumin  BPH: Flomax  Hypokalemia: Continue supplementation  Goals of Care: Palliative care   following.  CODE STATUS changed to DNR.  Plan is for hospice approach.  TOC assisting with discharge to ALF with hospice.          DVT prophylaxis:SCDs Start: 11/13/22 2113     Code Status: DNR  Family Communication: called and discussed with son on phone 5/19  Patient status:Inpatient  Patient is from :ILF  Anticipated discharge to:ALF with hospcie  Estimated DC date: Whenever possible   Consultants: GI, cardiology, palliative care  Procedures: None  Antimicrobials:  Anti-infectives (From admission, onward)    None       Subjective: Patient seen and in the chair today.  Overall comfortable.  Complains of pain on the right knee.  No bowel movement yet.  RN requested to give Dulcolax suppository ,denies any abdomen pain  Objective: Vitals:   11/16/22 0505 11/16/22 1319 11/16/22 2007 11/17/22 0436  BP: 139/62 (!) 133/59 (!) 157/58 135/73  Pulse: 62 (!) 55 63 85  Resp: 18  18 18   Temp: 98 F (36.7 C) 97.9 F (36.6 C) 98.2 F (36.8 C) 98.1 F (36.7 C)  TempSrc: Oral Oral Oral Oral  SpO2: 94% 100% 97% 91%    Intake/Output Summary (Last 24 hours) at 11/17/2022 1111 Last data filed at 11/16/2022 1900 Gross  per 24 hour  Intake 720 ml  Output --  Net 720 ml   There were no vitals filed for this visit.  Examination:   General exam: Overall comfortable, not in distress HEENT: PERRL Respiratory system:  no wheezes or crackles  Cardiovascular system: S1 & S2 heard, RRR.  Gastrointestinal system: Abdomen is mildly distended, soft, nontender, good bowel sounds heard  Central nervous system: Alert and oriented Extremities:  Bilateral lower extremity edema, no clubbing ,no cyanosis Skin: No rashes, no ulcers,no icterus     Data Reviewed: I have personally reviewed following labs and imaging studies  CBC: Recent Labs  Lab 11/13/22 1750 11/14/22 0737 11/14/22 1613 11/15/22 0804 11/15/22 1646 11/16/22 0530  WBC 5.2  --  5.5 4.4 5.3 4.6  NEUTROABS 3.6  --  3.8 2.8 3.6 3.0  HGB 6.5* 9.0* 8.8* 9.2* 9.3* 8.3*  HCT 21.3* 28.0* 28.5* 29.8* 29.9* 27.4*  MCV 97.3  --  93.1 94.3 92.9 93.2  PLT 287  --  281 260 288 250   Basic Metabolic Panel: Recent Labs  Lab 11/13/22 1750 11/14/22 0737 11/15/22 0804  NA 134* 137 133*  K 3.1* 3.3* 3.8  CL 96* 96* 101  CO2 30 31 26   GLUCOSE 181* 140* 151*  BUN 34* 24* 15  CREATININE 1.13 1.02 1.00  CALCIUM 8.7* 8.6* 8.0*  MG  --  1.7  --   PHOS  --  3.5  --      No results found for this or any previous visit (from the past 240 hour(s)).   Radiology Studies: No results found.  Scheduled Meds:  sodium chloride   Intravenous Once   buPROPion  300 mg Oral Daily   ferrous sulfate  325 mg Oral Q breakfast   furosemide  20 mg Oral Daily   gabapentin  800 mg Oral BID   lidocaine  1 patch Transdermal Q24H   LORazepam  0.5 mg Oral BID   losartan  25 mg Oral Daily   pantoprazole  40 mg Oral BID   polyethylene glycol  17 g Oral Daily   potassium chloride  20 mEq Oral Daily   primidone  50 mg Oral BID   senna-docusate  1 tablet Oral BID   tamsulosin  0.8 mg Oral QPC supper   Continuous Infusions:   LOS: 4 days   [name], MD [facility] P5/20/2024, 11:11 AM

## 2022-11-17 NOTE — TOC Progression Note (Addendum)
Transition of Care [facility]) - Progression Note    Patient Details  Name: [name] MRN: [id number] Date of Birth: 1932-11-13  [facility] of [facility]) CM/SW Contact  Beckie Busing, RN Phone Number:[phone]  11/17/2022, 10:52 AM  Clinical Narrative:    CM followed up with son [name] to update on need for ALF. CM has provided son with contact names Henreitta Cea or Kirstin Radcliff. Son verbalized understanding and will follow up. Son to call CM once he has followed up  with [facility].   1140 Cm followed up with Henreitta Cea at [facility] to determine if patient will transition to ALF. Shawna Orleans will reach out to son and follow up with CM.  1148 CM received return call from [facility] who states that she spoke with son [name] and [name] wants patient to return to his apartment with an added support option that the facility offers. The added support would consist of someone coming in 3 times per day to check/ assist patient for about 15 minutes each visit.  1157 CM spoke with son [name] to offer choice for Hospice. Son has no preference. Hospice Referral has been accepted by Trena Platt with [facility].    Expected Discharge Plan:  (To be determined) Barriers to Discharge: Other (must enter comment) (Per palliative, patient cannot return to independent living at [facility] with hospice.)  Expected Discharge Plan and Services In-house Referral: Clinical Social Work Discharge Planning Services: CM Consult Post Acute Care Choice: Hospice Living arrangements for the past 2 months: Independent Living Facility                 DME Arranged: N/A DME Agency: NA                   Social Determinants of Health (SDOH) Interventions SDOH Screenings   Food Insecurity: No Food Insecurity (10/10/2022)  Housing: Low Risk  (10/10/2022)  Transportation Needs: No Transportation Needs (10/10/2022)  Utilities: Not At Risk (10/10/2022)  Depression (PHQ2-9): Low Risk   (10/10/2022)  Tobacco Use: Low Risk  (11/13/2022)    Readmission Risk Interventions     No data to display

## 2022-11-17 NOTE — Progress Notes (Signed)
Patient discharged to son, who will bring patient back to [facility] Independent living. Discharge instructions gone over with son. Two prescriptions given to son to have filled, Others prescriptions called in to pharmacy and will be delivered to Patients home. Khyrin had a BM after given a suppository. Patient was discharge via wheelchair. Next medications due were written on discharge papers.

## 2022-11-17 NOTE — TOC Transition Note (Signed)
Transition of Care [facility]) - CM/SW Discharge Note   Patient Details  Name: [name] MRN: [id number] Date of Birth: 1933-02-08  Transition of Care [facility]) CM/SW Contact:  Beckie Busing, RN Phone Number:[phone]  11/17/2022, 12:09 PM   Clinical Narrative:    Patient is discharging back to [facility] with added support care and outpatient hospice referral. Son has been updated on the plan. No other TOC needs noted at this time. TOC will sign off.      Barriers to Discharge: Other (must enter comment) (Per palliative, patient cannot return to independent living at [facility] with hospice.)   Patient Goals and CMS Choice CMS Medicare.gov Compare Post Acute Care list provided to:: Patient Choice offered to / list presented to : Patient  Discharge Placement                         Discharge Plan and Services Additional resources added to the After Visit Summary for   In-house Referral: Clinical Social Work Discharge Planning Services: CM Consult Post Acute Care Choice: Hospice          DME Arranged: N/A DME Agency: NA                  Social Determinants of Health (SDOH) Interventions SDOH Screenings   Food Insecurity: No Food Insecurity (10/10/2022)  Housing: Low Risk  (10/10/2022)  Transportation Needs: No Transportation Needs (10/10/2022)  Utilities: Not At Risk (10/10/2022)  Depression (PHQ2-9): Low Risk  (10/10/2022)  Tobacco Use: Low Risk  (11/13/2022)     Readmission Risk Interventions     No data to display

## 2022-11-17 NOTE — Progress Notes (Signed)
Daily Progress Note   Patient Name: [name]       Date: 11/17/2022 DOB: August 01, 1932  Age: 89 y.o. MRN#: [id number] Attending Physician: [name], MD Primary Care Physician: [name], NP Admit Date: 11/13/2022  Reason for Consultation/Follow-up: Establishing goals of care  Subjective: No chest pain, resting in chair, now on low dose Morphine PO. Denies shortness of breath.   Length of Stay: 4  Current Medications: Scheduled Meds:   sodium chloride   Intravenous Once   bisacodyl  10 mg Rectal Once   buPROPion  300 mg Oral Daily   ferrous sulfate  325 mg Oral Q breakfast   gabapentin  800 mg Oral BID   LORazepam  0.5 mg Oral BID   losartan  25 mg Oral Daily   pantoprazole  40 mg Oral BID   polyethylene glycol  17 g Oral Daily   primidone  50 mg Oral BID   senna-docusate  1 tablet Oral BID   tamsulosin  0.8 mg Oral QPC supper    Continuous Infusions:   PRN Meds: acetaminophen, alum & mag hydroxide-simeth, morphine CONCENTRATE, nitroGLYCERIN, ondansetron (ZOFRAN) IV, mouth rinse  Physical Exam         Awake alert No distress Sitting in chair Some LE edema  Vital Signs: BP 135/73 (BP Location: Right Arm)   Pulse 85   Temp 98.1 F (36.7 C) (Oral)   Resp 18   SpO2 91%  SpO2: SpO2: 91 % O2 Device: O2 Device: Room Air O2 Flow Rate: O2 Flow Rate (L/min): 2 L/min  Intake/output summary:  Intake/Output Summary (Last 24 hours) at 11/17/2022 1022 Last data filed at 11/16/2022 1900 Gross per 24 hour  Intake 720 ml  Output --  Net 720 ml    LBM: Last BM Date : 11/12/22 Baseline Weight:   Most recent weight:         Palliative Assessment/Data:      Patient Active Problem List   Diagnosis Date Noted   General weakness 11/16/2022    Palliative care by specialist 11/14/2022   Elevated troponin 11/14/2022   Symptomatic anemia 11/14/2022   GIB (gastrointestinal bleeding) 11/13/2022   Chest pain 11/13/2022   ABLA (acute blood loss anemia) 11/13/2022   Goals of care, counseling/discussion 10/24/2022   Gastric cancer (HCC) 10/09/2022  Abnormal CT scan, stomach 09/18/2022   Gastric ulcer without hemorrhage or perforation 09/18/2022   Leg swelling 03/12/2021   Acute cholecystitis 07/29/2019   Sinus bradycardia 11/26/2016   Prostate cancer (HCC) 02/03/2013   ANXIETY 03/07/2010   Essential hypertension, benign 03/07/2010   GERD 03/07/2010   HOARSENESS, CHRONIC 03/07/2010   DIABETES MELLITUS 03/06/2010   HYPERCHOLESTEROLEMIA 03/06/2010   DEPRESSION 03/06/2010   HIATAL HERNIA WITH REFLUX 03/06/2010   DIVERTICULOSIS, COLON 03/06/2010   IBS 03/06/2010   RENAL CALCULUS, RECURRENT 03/06/2010   COUGH 03/06/2010    Palliative Care Assessment & Plan   Patient Profile:    Assessment:  [name] is a 89 y.o. 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  Low dose PO MSO4 PRN pain/dyspnea Discussed with TOC and TRH MD colleagues.  Recommend assisted living facility with hospice.     Code Status:    Code Status Orders  (From admission, onward)           Start     Ordered   11/14/22 1632  Do not attempt resuscitation (DNR)  Continuous       Question Answer Comment  If patient has no pulse and is not breathing Do Not Attempt Resuscitation   If patient has a pulse and/or is breathing: Medical Treatment Goals LIMITED ADDITIONAL INTERVENTIONS: Use medication/IV fluids and cardiac monitoring as indicated; Do not use intubation or mechanical ventilation (DNI), also provide comfort medications.  Transfer to Progressive/Stepdown as indicated, avoid Intensive Care.    Consent: Discussion documented in EHR or advanced directives reviewed      11/14/22 1632           Code Status History     Date Active Date Inactive Code Status Order ID Comments User Context   11/13/2022 2114 11/14/2022 1632 Full Code [id number]  [name], MD ED   07/29/2019 2105 08/02/2019 2354 Full Code [id number]  [name], [name], MD ED       Prognosis:  < 6 months  Discharge Planning: ALF with hospice.   Care plan was discussed with IDT  Thank you for allowing the Palliative Medicine Team to assist in the care of this patient. Mod MDM.      Greater than 50%  of this time was spent counseling and coordinating care related to the above assessment and plan.  [name], MD  Please contact Palliative Medicine Team phone at [phone] for questions and concerns.

## 2022-11-28 ENCOUNTER — Inpatient Hospital Stay

## 2022-11-28 ENCOUNTER — Inpatient Hospital Stay (HOSPITAL_BASED_OUTPATIENT_CLINIC_OR_DEPARTMENT_OTHER): Admitting: Hematology

## 2022-11-28 VITALS — BP 133/53 | HR 66 | Temp 97.4°F | Resp 15 | Ht 65.0 in | Wt 179.8 lb

## 2022-11-28 DIAGNOSIS — Z79620 Long term (current) use of immunosuppressive biologic: Secondary | ICD-10-CM | POA: Diagnosis not present

## 2022-11-28 DIAGNOSIS — C163 Malignant neoplasm of pyloric antrum: Secondary | ICD-10-CM | POA: Diagnosis not present

## 2022-11-28 DIAGNOSIS — C169 Malignant neoplasm of stomach, unspecified: Secondary | ICD-10-CM | POA: Diagnosis present

## 2022-11-28 LAB — CMP (CANCER CENTER ONLY)
ALT: 69 U/L — ABNORMAL HIGH (ref 0–44)
AST: 30 U/L (ref 15–41)
Albumin: 3.6 g/dL (ref 3.5–5.0)
Alkaline Phosphatase: 280 U/L — ABNORMAL HIGH (ref 38–126)
Anion gap: 7 (ref 5–15)
BUN: 21 mg/dL (ref 8–23)
CO2: 37 mmol/L — ABNORMAL HIGH (ref 22–32)
Calcium: 9.1 mg/dL (ref 8.9–10.3)
Chloride: 90 mmol/L — ABNORMAL LOW (ref 98–111)
Creatinine: 1.31 mg/dL — ABNORMAL HIGH (ref 0.61–1.24)
GFR, Estimated: 53 mL/min — ABNORMAL LOW (ref >60)
Glucose, Bld: 124 mg/dL — ABNORMAL HIGH (ref 70–99)
Potassium: 3.4 mmol/L — ABNORMAL LOW (ref 3.5–5.1)
Sodium: 134 mmol/L — ABNORMAL LOW (ref 135–145)
Total Bilirubin: 0.3 mg/dL (ref 0.3–1.2)
Total Protein: 6.3 g/dL — ABNORMAL LOW (ref 6.5–8.1)

## 2022-11-28 LAB — CBC WITH DIFFERENTIAL (CANCER CENTER ONLY)
Abs Immature Granulocytes: 0.02 10*3/uL (ref 0.00–0.07)
Basophils Absolute: 0 10*3/uL (ref 0.0–0.1)
Basophils Relative: 1 %
Eosinophils Absolute: 0.1 10*3/uL (ref 0.0–0.5)
Eosinophils Relative: 2 %
HCT: 30.5 % — ABNORMAL LOW (ref 39.0–52.0)
Hemoglobin: 9.9 g/dL — ABNORMAL LOW (ref 13.0–17.0)
Immature Granulocytes: 1 %
Lymphocytes Relative: 19 %
Lymphs Abs: 0.8 10*3/uL (ref 0.7–4.0)
MCH: 28.4 pg (ref 26.0–34.0)
MCHC: 32.5 g/dL (ref 30.0–36.0)
MCV: 87.6 fL (ref 80.0–100.0)
Monocytes Absolute: 0.5 10*3/uL (ref 0.1–1.0)
Monocytes Relative: 11 %
Neutro Abs: 2.9 10*3/uL (ref 1.7–7.7)
Neutrophils Relative %: 66 %
Platelet Count: 359 10*3/uL (ref 150–400)
RBC: 3.48 MIL/uL — ABNORMAL LOW (ref 4.22–5.81)
RDW: 15.8 % — ABNORMAL HIGH (ref 11.5–15.5)
WBC Count: 4.3 10*3/uL (ref 4.0–10.5)
nRBC: 0 % (ref 0.0–0.2)

## 2022-11-28 LAB — TSH: TSH: 1.31 u[IU]/mL (ref 0.350–4.500)

## 2022-11-28 NOTE — Progress Notes (Unsigned)
[facility]   Telephone:([phone]) [phone] Fax:([phone]) [phone]   Clinic Follow up Note   Patient Care Team: Jason Paris, NP as PCP - General (Nurse Practitioner) Jason Chard, DO as Consulting Physician (Neurology) Jason Mood, MD as Consulting Physician (Hematology)  Date of Service:  11/28/2022  CHIEF COMPLAINT: f/u of Gastric Cancer     CURRENT THERAPY:  Gastric Cancer     ASSESSMENT:  [name] is a 89 y.o. male with   Gastric cancer (HCC) -cTxN0M0, likely early stage, [facility], HER2 (-) -He presented with abdominal pain in February 2024. CT in the ER noted cavitation ulcer arising from the posterior gastric antrum at the pylorus.   -He underwent EGD on 09/30/22 by Dr. Myrtie Gonzales. This demonstrated non-bleeding cratered gastric ulcer with irregular and heaped edges, measuring 20x25 mm in the largest dimension. Biopsy confirmed invasive moderately differentiated adenocarcinoma -Staging scan was negative for nodal or distant metastasis. -We previously discussed the option of surgery, radiation, and chemotherapy, he declined all.  He has minimal symptoms, and evaluate his quality of life more. 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 -I recommend immunotherapy Keytruda every 3 weeks. He is scheduled to start today, I reviewed the potential benefit and side effects with him again today. -Patient tells me today that he is not interested in any cancer treatment, he would rather have hospice at home.  Weight reviewed with logistic and goal of care with him, and I will reach out to his home palliative care service and transition him to hospice. -Follow-up as needed.    PLAN: -lab reviewed -I  encourage increase fluid intake. -cancel Treatment today per pt  -Pt would like to go to hospice, we  will reach out to his home palliative care service and changed him to hospice service.  I will be happy to serve as his attending when he is under hospice care. -I offered a call to his son, he declined.   SUMMARY OF ONCOLOGIC HISTORY: Oncology History  Gastric cancer (HCC)  08/13/2022 Imaging   CT ABDOMEN PELVIS W CONTRAST   IMPRESSION: 1. Cavitating ulcer arising from the posterior wall of the gastric antrum at the pylorus with mild surrounding inflammatory stranding. No extraluminal gas or loculated fluid collection identified. No evidence of obstruction. Correlation with endoscopy is recommended. 2. Moderate coronary artery calcification. 3. Minimal right nonobstructing nephrolithiasis. 4. Moderate distal colonic diverticulosis. 5. Surgical changes of probable right inguinal hernia repair. Increasing poorly circumscribed infiltrative soft tissue within this region may represent a local inflammatory process or progressive fibrosis. This is not well characterized on this examination and correlation with surgical history and clinical examination is recommended. 6. Interval increase in size of a 24 mm exophytic lesion arising laterally from the interpolar region of the left kidney. This may represent a hyperdense renal cyst or solid renal mass. If indicated, this would be better assessed with contrast enhanced CT or MRI examination once the patient's acute issues have resolved.   09/30/2022 Procedure   Upper Endoscopy   Findings: -1 benign-appearing intrinsic mild stenosis was found at the gastroesophageal junction the stenosis was transversed. -The exam of the esophagus was otherwise normal. -1 nonbleeding cratered gastric ulcer with irregular and heaped edges as well as pigmented material on the base was found on the anterior wall of the prepyloric region of  the stomach.  The lesion was 20 to 25 mm in largest dimension.  Biopsies were taken with a cold forcep for  histology. -Multiple small sessile fundic gland polyps with no stigmata of recent bleeding were found in the gastric fundus and in the gastric body. -Patchy mild erythematous mucosa was found in the entire examined stomach.  Several biopsies were obtained in the gastric body and in the gastric antrum with cold forceps for histology to rule out H. Pylori -The examined duodenum was otherwise normal   09/30/2022 Pathology Results   Accession: [id number]  FINAL DIAGNOSIS Diagnosis 1. Stomach, biopsy, ulcer - INVASIVE MODERATELY DIFFERENTIATED ADENOCARCINOMA, SEE COMMENT 2. Stomach, biopsy, antrum and body - GASTRIC ANTRAL MUCOSA WITH NONSPECIFIC REACTIVE GASTROPATHY - GASTRIC OXYNTIC MUCOSA WITH PARIETAL CELL HYPERPLASIA AS CAN BE SEEN IN HYPERGASTRINEMIC STATES SUCH AS PPI THERAPY. - HELICOBACTER PYLORI-LIKE ORGANISMS ARE NOT IDENTIFIED ON ROUTINE H&E STAIN   10/09/2022 Initial Diagnosis   Gastric cancer   10/13/2022 Imaging   CT CHEST W CONTRAST   IMPRESSION: 1. No evidence of metastatic disease in the chest. 2. Persistent gastric wall thickening in the visualized posterior antral pyloric region, compatible with known gastric malignancy. 3. Three-vessel coronary atherosclerosis. 4.  Aortic Atherosclerosis (ICD10-I70.0).   11/28/2022 -  Chemotherapy   Patient is on Treatment Plan : GASTROESOPHAGEAL Pembrolizumab (200) q21d        INTERVAL HISTORY:  [name] is here for a follow up of Gastric Cancer . He was last seen by me on 10/31/2022. He presents to the clinic alone. Pt state that he has some diarrhea and  he does not have a good appetite. He state that he was hospitalized on May 16 for three day. Pt stays in Independent living and is still able to care for himself.      All other systems were reviewed with the patient and are negative.  MEDICAL HISTORY:  Past Medical History:  Diagnosis Date   Anxiety    Cataract    Depression    Diabetes mellitus     GERD (gastroesophageal reflux disease)    Hyperlipemia    Hypertension     SURGICAL HISTORY: Past Surgical History:  Procedure Laterality Date   CHOLECYSTECTOMY N/A 07/30/2019   Procedure: LAPAROSCOPIC CHOLECYSTECTOMY;  Surgeon: [name], [name], MD;  Location: [facility];  Service: General;  Laterality: N/A;   FRACTURE SURGERY     HERNIA REPAIR      I have reviewed the social history and family history with the patient and they are unchanged from previous note.  ALLERGIES:  has No Known Allergies.  MEDICATIONS:  Current Outpatient Medications  Medication Sig Dispense Refill   acetaminophen (TYLENOL) 500 MG tablet Take 500 mg by mouth every 6 (six) hours as needed for moderate pain or mild pain.     buPROPion (WELLBUTRIN XL) 300 MG 24 hr tablet Take 300 mg by mouth daily.  6   chlorhexidine (PERIDEX) 0.12 % solution Use as directed 15 mLs in the mouth or throat 2 (two) times daily.     ferrous sulfate 325 (65 FE) MG EC tablet Take 1 tablet (325 mg total) by mouth daily with breakfast. 30 tablet 3   furosemide (LASIX) 20 MG tablet Take 1 tablet (20 mg total) by mouth daily. 30 tablet 0   gabapentin (NEURONTIN) 800 MG tablet Take 800 mg by mouth 2 (two) times daily.     glipiZIDE (GLUCOTROL XL) 2.5 MG 24 hr tablet Take 2.5 mg by  mouth daily with breakfast.     lidocaine (LIDODERM) 5 % Place 1 patch onto the skin daily. Remove & Discard patch within 12 hours or as directed by MD ,Apply on right knee 30 patch 0   loratadine (CLARITIN) 10 MG tablet Take 10 mg by mouth daily.     LORazepam (ATIVAN) 0.5 MG tablet Take 1-2 tablets (0.5-1 mg total) by mouth at bedtime. Take 0.5 mg (1 tablet) by mouth every night at bedtime. May take an additional 0.5 mg (1 tablet) as needed. 30 tablet 0   losartan (COZAAR) 25 MG tablet Take 1 tablet (25 mg total) by mouth daily. 30 tablet 0   morphine (MSIR) 15 MG tablet Take 1 tablet (15 mg total) by mouth every 6 (six) hours as needed for severe pain. 30  tablet 0   Multiple Vitamin (MULITIVITAMIN WITH MINERALS) TABS Take 1 tablet by mouth daily.     Omega-3 Fatty Acids (FISH OIL) 1000 MG CAPS Take 1,000 mg by mouth daily.     pantoprazole (PROTONIX) 40 MG tablet Take 1 tablet (40 mg total) by mouth 2 (two) times daily. 60 tablet 0   polyethylene glycol (MIRALAX / GLYCOLAX) 17 g packet Take 17 g by mouth daily. 14 each 0   potassium chloride SA (KLOR-CON M) 20 MEQ tablet Take 1 tablet (20 mEq total) by mouth daily. 30 tablet 0   primidone (MYSOLINE) 50 MG tablet Take 50 mg by mouth 2 (two) times daily.     senna-docusate (SENOKOT-S) 8.6-50 MG tablet Take 1 tablet by mouth 2 (two) times daily. 30 tablet 0   tamsulosin (FLOMAX) 0.4 MG CAPS capsule Take 0.8 mg by mouth daily after supper.     Current Facility-Administered Medications  Medication Dose Route Frequency Provider Last Rate Last Admin   0.9 %  sodium chloride infusion  500 mL Intravenous Once Jenel Lucks, MD        PHYSICAL EXAMINATION: ECOG PERFORMANCE STATUS: 1 - Symptomatic but completely ambulatory  Vitals:   11/28/22 1333  BP: (!) 133/53  Pulse: 66  Resp: 15  Temp: (!) 97.4 F (36.3 C)  SpO2: 98%   Wt Readings from Last 3 Encounters:  11/28/22 179 lb 12.8 oz (81.6 kg)  10/10/22 186 lb (84.4 kg)  09/30/22 187 lb (84.8 kg)     GENERAL:alert, no distress and comfortable SKIN: skin color normal, no rashes or significant lesions EYES: normal, Conjunctiva are pink and non-injected, sclera clear  NEURO: alert & oriented x 3 with fluent speech  LABORATORY DATA:  I have reviewed the data as listed    Latest Ref Rng & Units 11/28/2022    1:10 PM 11/16/2022    5:30 AM 11/15/2022    4:46 PM  CBC  WBC 4.0 - 10.5 K/uL 4.3  4.6  5.3   Hemoglobin 13.0 - 17.0 g/dL 9.9  8.3  9.3   Hematocrit 39.0 - 52.0 % 30.5  27.4  29.9   Platelets 150 - 400 K/uL 359  250  288         Latest Ref Rng & Units 11/28/2022    1:10 PM 11/15/2022    8:04 AM 11/14/2022    7:37 AM  CMP   Glucose 70 - 99 mg/dL 811  914  782   BUN 8 - 23 mg/dL 21  15  24    Creatinine 0.61 - 1.24 mg/dL 9.56  2.13  0.86   Sodium 135 - 145 mmol/L 134  133  137   Potassium 3.5 - 5.1 mmol/L 3.4  3.8  3.3   Chloride 98 - 111 mmol/L 90  101  96   CO2 22 - 32 mmol/L 37  26  31   Calcium 8.9 - 10.3 mg/dL 9.1  8.0  8.6   Total Protein 6.5 - 8.1 g/dL 6.3     Total Bilirubin 0.3 - 1.2 mg/dL 0.3     Alkaline Phos 38 - 126 U/L 280     AST 15 - 41 U/L 30     ALT 0 - 44 U/L 69         RADIOGRAPHIC STUDIES: I have personally reviewed the radiological images as listed and agreed with the findings in the report. No results found.    No orders of the defined types were placed in this encounter.  All questions were answered. The patient knows to call the clinic with any problems, questions or concerns. No barriers to learning was detected. The total time spent in the appointment was 30 minutes.     Jason Mood, MD 11/28/2022   Carolin Coy, CMA, am acting as scribe for Jason Mood, MD.   I have reviewed the above documentation for accuracy and completeness, and I agree with the above.

## 2022-11-28 NOTE — Assessment & Plan Note (Signed)
-  cTxN0M0, likely early stage, [facility], HER2 (-) -He presented with abdominal pain in February 2024. CT in the ER noted cavitation ulcer arising from the posterior gastric antrum at the pylorus.   -He underwent EGD on 09/30/22 by Dr. Myrtie Neither. This demonstrated non-bleeding cratered gastric ulcer with irregular and heaped edges, measuring 20x25 mm in the largest dimension. Biopsy confirmed invasive moderately differentiated adenocarcinoma -Staging scan was negative for nodal or distant metastasis. -We previously discussed the option of surgery, radiation, and chemotherapy, he declined all.  He has minimal symptoms, and evaluate his quality of life more. 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 -I recommend immunotherapy Keytruda every 3 weeks. He is scheduled to start today

## 2022-11-29 ENCOUNTER — Encounter: Payer: Self-pay | Admitting: Hematology

## 2022-11-30 LAB — T4: T4, Total: 7.9 ug/dL (ref 4.5–12.0)

## 2022-12-01 ENCOUNTER — Other Ambulatory Visit: Payer: Self-pay

## 2022-12-01 NOTE — Progress Notes (Signed)
Spoke with receptionist with [facility] [phone]) to confirm pt is now on Hospice with their services.  Receptionist confirmed pt is on Hospice with their services.

## 2022-12-02 ENCOUNTER — Other Ambulatory Visit: Payer: Self-pay

## 2022-12-18 ENCOUNTER — Emergency Department (HOSPITAL_COMMUNITY)

## 2022-12-18 ENCOUNTER — Inpatient Hospital Stay (HOSPITAL_COMMUNITY)

## 2022-12-18 ENCOUNTER — Inpatient Hospital Stay: Payer: Medicare HMO | Admitting: Hematology

## 2022-12-18 ENCOUNTER — Inpatient Hospital Stay: Payer: Medicare HMO

## 2022-12-18 ENCOUNTER — Encounter (HOSPITAL_COMMUNITY): Payer: Self-pay | Admitting: Internal Medicine

## 2022-12-18 ENCOUNTER — Other Ambulatory Visit: Payer: Self-pay

## 2022-12-18 ENCOUNTER — Inpatient Hospital Stay (HOSPITAL_COMMUNITY)
Admission: EM | Admit: 2022-12-18 | Discharge: 2022-12-30 | DRG: 640 | Disposition: A | Discharge status: Skilled Nursing Facility | Source: Skilled Nursing Facility | Attending: Internal Medicine | Admitting: Internal Medicine

## 2022-12-18 DIAGNOSIS — C169 Malignant neoplasm of stomach, unspecified: Secondary | ICD-10-CM | POA: Diagnosis present

## 2022-12-18 DIAGNOSIS — K254 Chronic or unspecified gastric ulcer with hemorrhage: Secondary | ICD-10-CM | POA: Diagnosis present

## 2022-12-18 DIAGNOSIS — Z801 Family history of malignant neoplasm of trachea, bronchus and lung: Secondary | ICD-10-CM | POA: Diagnosis not present

## 2022-12-18 DIAGNOSIS — E871 Hypo-osmolality and hyponatremia: Secondary | ICD-10-CM | POA: Diagnosis present

## 2022-12-18 DIAGNOSIS — Z79899 Other long term (current) drug therapy: Secondary | ICD-10-CM | POA: Diagnosis not present

## 2022-12-18 DIAGNOSIS — I5031 Acute diastolic (congestive) heart failure: Secondary | ICD-10-CM | POA: Diagnosis not present

## 2022-12-18 DIAGNOSIS — Z7984 Long term (current) use of oral hypoglycemic drugs: Secondary | ICD-10-CM

## 2022-12-18 DIAGNOSIS — Z66 Do not resuscitate: Secondary | ICD-10-CM | POA: Diagnosis present

## 2022-12-18 DIAGNOSIS — E861 Hypovolemia: Secondary | ICD-10-CM | POA: Diagnosis present

## 2022-12-18 DIAGNOSIS — R079 Chest pain, unspecified: Secondary | ICD-10-CM | POA: Diagnosis not present

## 2022-12-18 DIAGNOSIS — R7989 Other specified abnormal findings of blood chemistry: Secondary | ICD-10-CM | POA: Diagnosis present

## 2022-12-18 DIAGNOSIS — N400 Enlarged prostate without lower urinary tract symptoms: Secondary | ICD-10-CM | POA: Diagnosis present

## 2022-12-18 DIAGNOSIS — M21962 Unspecified acquired deformity of left lower leg: Secondary | ICD-10-CM | POA: Diagnosis present

## 2022-12-18 DIAGNOSIS — F419 Anxiety disorder, unspecified: Secondary | ICD-10-CM | POA: Diagnosis present

## 2022-12-18 DIAGNOSIS — Z1152 Encounter for screening for COVID-19: Secondary | ICD-10-CM

## 2022-12-18 DIAGNOSIS — F32A Depression, unspecified: Secondary | ICD-10-CM | POA: Diagnosis present

## 2022-12-18 DIAGNOSIS — N179 Acute kidney failure, unspecified: Secondary | ICD-10-CM | POA: Diagnosis present

## 2022-12-18 DIAGNOSIS — K921 Melena: Secondary | ICD-10-CM | POA: Diagnosis not present

## 2022-12-18 DIAGNOSIS — W19XXXA Unspecified fall, initial encounter: Secondary | ICD-10-CM | POA: Diagnosis not present

## 2022-12-18 DIAGNOSIS — Z515 Encounter for palliative care: Secondary | ICD-10-CM

## 2022-12-18 DIAGNOSIS — I110 Hypertensive heart disease with heart failure: Secondary | ICD-10-CM | POA: Diagnosis present

## 2022-12-18 DIAGNOSIS — Y92129 Unspecified place in nursing home as the place of occurrence of the external cause: Secondary | ICD-10-CM

## 2022-12-18 DIAGNOSIS — W1830XA Fall on same level, unspecified, initial encounter: Secondary | ICD-10-CM | POA: Diagnosis present

## 2022-12-18 DIAGNOSIS — K922 Gastrointestinal hemorrhage, unspecified: Secondary | ICD-10-CM | POA: Diagnosis not present

## 2022-12-18 DIAGNOSIS — I5033 Acute on chronic diastolic (congestive) heart failure: Secondary | ICD-10-CM | POA: Diagnosis not present

## 2022-12-18 DIAGNOSIS — E1140 Type 2 diabetes mellitus with diabetic neuropathy, unspecified: Secondary | ICD-10-CM | POA: Diagnosis present

## 2022-12-18 DIAGNOSIS — S92302A Fracture of unspecified metatarsal bone(s), left foot, initial encounter for closed fracture: Secondary | ICD-10-CM | POA: Diagnosis not present

## 2022-12-18 DIAGNOSIS — R197 Diarrhea, unspecified: Secondary | ICD-10-CM | POA: Diagnosis present

## 2022-12-18 DIAGNOSIS — E860 Dehydration: Secondary | ICD-10-CM | POA: Diagnosis present

## 2022-12-18 DIAGNOSIS — K219 Gastro-esophageal reflux disease without esophagitis: Secondary | ICD-10-CM | POA: Diagnosis present

## 2022-12-18 DIAGNOSIS — D500 Iron deficiency anemia secondary to blood loss (chronic): Secondary | ICD-10-CM | POA: Diagnosis present

## 2022-12-18 DIAGNOSIS — E785 Hyperlipidemia, unspecified: Secondary | ICD-10-CM | POA: Diagnosis present

## 2022-12-18 DIAGNOSIS — S92812A Other fracture of left foot, initial encounter for closed fracture: Secondary | ICD-10-CM | POA: Insufficient documentation

## 2022-12-18 DIAGNOSIS — S92812B Other fracture of left foot, initial encounter for open fracture: Secondary | ICD-10-CM | POA: Diagnosis not present

## 2022-12-18 DIAGNOSIS — I10 Essential (primary) hypertension: Secondary | ICD-10-CM | POA: Diagnosis present

## 2022-12-18 LAB — COMPREHENSIVE METABOLIC PANEL
ALT: 49 U/L — ABNORMAL HIGH (ref 0–44)
AST: 35 U/L (ref 15–41)
Albumin: 3.1 g/dL — ABNORMAL LOW (ref 3.5–5.0)
Alkaline Phosphatase: 220 U/L — ABNORMAL HIGH (ref 38–126)
Anion gap: 11 (ref 5–15)
BUN: 39 mg/dL — ABNORMAL HIGH (ref 8–23)
CO2: 31 mmol/L (ref 22–32)
Calcium: 8.8 mg/dL — ABNORMAL LOW (ref 8.9–10.3)
Chloride: 79 mmol/L — ABNORMAL LOW (ref 98–111)
Creatinine, Ser: 2.31 mg/dL — ABNORMAL HIGH (ref 0.61–1.24)
GFR, Estimated: 27 mL/min — ABNORMAL LOW (ref >60)
Glucose, Bld: 149 mg/dL — ABNORMAL HIGH (ref 70–99)
Potassium: 3.5 mmol/L (ref 3.5–5.1)
Sodium: 121 mmol/L — ABNORMAL LOW (ref 135–145)
Total Bilirubin: 0.5 mg/dL (ref 0.3–1.2)
Total Protein: 6.2 g/dL — ABNORMAL LOW (ref 6.5–8.1)

## 2022-12-18 LAB — BASIC METABOLIC PANEL
Anion gap: 15 (ref 5–15)
Anion gap: 9 (ref 5–15)
BUN: 37 mg/dL — ABNORMAL HIGH (ref 8–23)
BUN: 36 mg/dL — ABNORMAL HIGH (ref 8–23)
CO2: 28 mmol/L (ref 22–32)
CO2: 31 mmol/L (ref 22–32)
Calcium: 9 mg/dL (ref 8.9–10.3)
Calcium: 8.5 mg/dL — ABNORMAL LOW (ref 8.9–10.3)
Chloride: 80 mmol/L — ABNORMAL LOW (ref 98–111)
Chloride: 83 mmol/L — ABNORMAL LOW (ref 98–111)
Creatinine, Ser: 2.05 mg/dL — ABNORMAL HIGH (ref 0.61–1.24)
Creatinine, Ser: 2.06 mg/dL — ABNORMAL HIGH (ref 0.61–1.24)
GFR, Estimated: 31 mL/min — ABNORMAL LOW (ref >60)
GFR, Estimated: 30 mL/min — ABNORMAL LOW (ref >60)
Glucose, Bld: 141 mg/dL — ABNORMAL HIGH (ref 70–99)
Glucose, Bld: 98 mg/dL (ref 70–99)
Potassium: 3.3 mmol/L — ABNORMAL LOW (ref 3.5–5.1)
Potassium: 3.2 mmol/L — ABNORMAL LOW (ref 3.5–5.1)
Sodium: 123 mmol/L — ABNORMAL LOW (ref 135–145)
Sodium: 123 mmol/L — ABNORMAL LOW (ref 135–145)

## 2022-12-18 LAB — CBC WITH DIFFERENTIAL/PLATELET
Abs Immature Granulocytes: 0.03 10*3/uL (ref 0.00–0.07)
Basophils Absolute: 0 10*3/uL (ref 0.0–0.1)
Basophils Relative: 0 %
Eosinophils Absolute: 0.1 10*3/uL (ref 0.0–0.5)
Eosinophils Relative: 1 %
HCT: 29.2 % — ABNORMAL LOW (ref 39.0–52.0)
Hemoglobin: 9.4 g/dL — ABNORMAL LOW (ref 13.0–17.0)
Immature Granulocytes: 0 %
Lymphocytes Relative: 14 %
Lymphs Abs: 1.1 10*3/uL (ref 0.7–4.0)
MCH: 26.7 pg (ref 26.0–34.0)
MCHC: 32.2 g/dL (ref 30.0–36.0)
MCV: 83 fL (ref 80.0–100.0)
Monocytes Absolute: 0.7 10*3/uL (ref 0.1–1.0)
Monocytes Relative: 10 %
Neutro Abs: 5.7 10*3/uL (ref 1.7–7.7)
Neutrophils Relative %: 75 %
Platelets: 331 10*3/uL (ref 150–400)
RBC: 3.52 MIL/uL — ABNORMAL LOW (ref 4.22–5.81)
RDW: 14.6 % (ref 11.5–15.5)
WBC: 7.7 10*3/uL (ref 4.0–10.5)
nRBC: 0 % (ref 0.0–0.2)

## 2022-12-18 LAB — I-STAT CHEM 8, ED
BUN: 35 mg/dL — ABNORMAL HIGH (ref 8–23)
Calcium, Ion: 1.17 mmol/L (ref 1.15–1.40)
Chloride: 77 mmol/L — ABNORMAL LOW (ref 98–111)
Creatinine, Ser: 2.4 mg/dL — ABNORMAL HIGH (ref 0.61–1.24)
Glucose, Bld: 159 mg/dL — ABNORMAL HIGH (ref 70–99)
HCT: 34 % — ABNORMAL LOW (ref 39.0–52.0)
Hemoglobin: 11.6 g/dL — ABNORMAL LOW (ref 13.0–17.0)
Potassium: 4 mmol/L (ref 3.5–5.1)
Sodium: 119 mmol/L — CL (ref 135–145)
TCO2: 33 mmol/L — ABNORMAL HIGH (ref 22–32)

## 2022-12-18 LAB — CBG MONITORING, ED: Glucose-Capillary: 144 mg/dL — ABNORMAL HIGH (ref 70–99)

## 2022-12-18 LAB — SODIUM, URINE, RANDOM: Sodium, Ur: <10 mmol/L

## 2022-12-18 LAB — GLUCOSE, CAPILLARY
Glucose-Capillary: 112 mg/dL — ABNORMAL HIGH (ref 70–99)
Glucose-Capillary: 157 mg/dL — ABNORMAL HIGH (ref 70–99)

## 2022-12-18 LAB — URINALYSIS, ROUTINE W REFLEX MICROSCOPIC
Bilirubin Urine: NEGATIVE
Glucose, UA: NEGATIVE mg/dL
Hgb urine dipstick: NEGATIVE
Ketones, ur: NEGATIVE mg/dL
Leukocytes,Ua: NEGATIVE
Nitrite: NEGATIVE
Protein, ur: NEGATIVE mg/dL
Specific Gravity, Urine: 1.01 (ref 1.005–1.030)
pH: 5 (ref 5.0–8.0)

## 2022-12-18 MED ORDER — ACETAMINOPHEN 650 MG RE SUPP: 650.0000 mg | Freq: Four times a day (QID) | RECTAL | Status: DC | PRN

## 2022-12-18 MED ORDER — LIDOCAINE HCL (PF) 1 % IJ SOLN
INTRAMUSCULAR | Status: AC
  Filled 2022-12-18: qty 30

## 2022-12-18 MED ORDER — ALBUTEROL SULFATE (2.5 MG/3ML) 0.083% IN NEBU: 2.5000 mg | INHALATION_SOLUTION | RESPIRATORY_TRACT | Status: DC | PRN

## 2022-12-18 MED ORDER — INSULIN ASPART 100 UNIT/ML IJ SOLN
0.0000 [IU] | Freq: Three times a day (TID) | INTRAMUSCULAR | Status: DC
  Administered 2022-12-18: 2 [IU] via SUBCUTANEOUS
  Administered 2022-12-19 (×2): 3 [IU] via SUBCUTANEOUS
  Administered 2022-12-20: 2 [IU] via SUBCUTANEOUS
  Administered 2022-12-20 – 2022-12-21 (×2): 3 [IU] via SUBCUTANEOUS
  Administered 2022-12-21 – 2022-12-23 (×3): 2 [IU] via SUBCUTANEOUS
  Filled 2022-12-18: qty 0.15

## 2022-12-18 MED ORDER — ONDANSETRON HCL 4 MG/2ML IJ SOLN: 4.0000 mg | Freq: Four times a day (QID) | INTRAMUSCULAR | Status: DC | PRN

## 2022-12-18 MED ORDER — LORAZEPAM 0.5 MG PO TABS
0.5000 mg | ORAL_TABLET | Freq: Every day | ORAL | Status: DC
  Administered 2022-12-18: 1 mg via ORAL
  Administered 2022-12-19: 0.5 mg via ORAL
  Administered 2022-12-20 – 2022-12-29 (×10): 1 mg via ORAL
  Filled 2022-12-18 (×12): qty 2

## 2022-12-18 MED ORDER — ONDANSETRON HCL 4 MG PO TABS: 4.0000 mg | ORAL_TABLET | Freq: Four times a day (QID) | ORAL | Status: DC | PRN

## 2022-12-18 MED ORDER — PANTOPRAZOLE SODIUM 40 MG PO TBEC
40.0000 mg | DELAYED_RELEASE_TABLET | Freq: Two times a day (BID) | ORAL | Status: DC
  Administered 2022-12-18 – 2022-12-30 (×24): 40 mg via ORAL
  Filled 2022-12-18 (×24): qty 1

## 2022-12-18 MED ORDER — SODIUM CHLORIDE 0.9 % IV SOLN: INTRAVENOUS | Status: DC

## 2022-12-18 MED ORDER — FERROUS SULFATE 325 (65 FE) MG PO TABS
325.0000 mg | ORAL_TABLET | Freq: Every day | ORAL | Status: DC
  Administered 2022-12-19 – 2022-12-30 (×12): 325 mg via ORAL
  Filled 2022-12-18 (×12): qty 1

## 2022-12-18 MED ORDER — SENNOSIDES-DOCUSATE SODIUM 8.6-50 MG PO TABS
1.0000 | ORAL_TABLET | Freq: Two times a day (BID) | ORAL | Status: DC
  Administered 2022-12-18 – 2022-12-30 (×14): 1 via ORAL
  Filled 2022-12-18 (×21): qty 1

## 2022-12-18 MED ORDER — LORATADINE 10 MG PO TABS
10.0000 mg | ORAL_TABLET | Freq: Every day | ORAL | Status: DC
  Administered 2022-12-19 – 2022-12-30 (×10): 10 mg via ORAL
  Filled 2022-12-18 (×12): qty 1

## 2022-12-18 MED ORDER — GABAPENTIN 400 MG PO CAPS
400.0000 mg | ORAL_CAPSULE | Freq: Two times a day (BID) | ORAL | Status: DC
  Administered 2022-12-18 – 2022-12-30 (×24): 400 mg via ORAL
  Filled 2022-12-18 (×23): qty 1

## 2022-12-18 MED ORDER — ACETAMINOPHEN 325 MG PO TABS
650.0000 mg | ORAL_TABLET | Freq: Four times a day (QID) | ORAL | Status: DC | PRN
  Administered 2022-12-20 – 2022-12-23 (×3): 650 mg via ORAL
  Filled 2022-12-18 (×3): qty 2

## 2022-12-18 MED ORDER — BUPIVACAINE HCL 0.25 % IJ SOLN
10.0000 mL | Freq: Once | INTRAMUSCULAR | Status: DC
  Filled 2022-12-18: qty 10

## 2022-12-18 MED ORDER — BUPIVACAINE HCL (PF) 0.25 % IJ SOLN: 10.0000 mL | Freq: Once | INTRAMUSCULAR | Status: DC

## 2022-12-18 MED ORDER — INSULIN ASPART 100 UNIT/ML IJ SOLN
0.0000 [IU] | Freq: Every day | INTRAMUSCULAR | Status: DC
  Filled 2022-12-18: qty 0.05

## 2022-12-18 MED ORDER — POLYETHYLENE GLYCOL 3350 17 G PO PACK
17.0000 g | PACK | Freq: Every day | ORAL | Status: DC
  Administered 2022-12-19 – 2022-12-30 (×4): 17 g via ORAL
  Filled 2022-12-18 (×8): qty 1

## 2022-12-18 MED ORDER — MORPHINE SULFATE 15 MG PO TABS
15.0000 mg | ORAL_TABLET | Freq: Four times a day (QID) | ORAL | Status: DC | PRN
  Administered 2022-12-24 – 2022-12-30 (×5): 15 mg via ORAL
  Filled 2022-12-18 (×5): qty 1

## 2022-12-18 MED ORDER — LACTATED RINGERS IV SOLN: INTRAVENOUS | Status: DC

## 2022-12-18 MED ORDER — BUPROPION HCL ER (XL) 150 MG PO TB24
300.0000 mg | ORAL_TABLET | Freq: Every day | ORAL | Status: DC
  Administered 2022-12-19 – 2022-12-30 (×12): 300 mg via ORAL
  Filled 2022-12-18 (×12): qty 2

## 2022-12-18 MED ORDER — MORPHINE SULFATE (PF) 2 MG/ML IV SOLN
2.0000 mg | INTRAVENOUS | Status: DC | PRN
  Administered 2022-12-18 – 2022-12-29 (×10): 2 mg via INTRAVENOUS
  Filled 2022-12-18 (×10): qty 1

## 2022-12-18 MED ORDER — TAMSULOSIN HCL 0.4 MG PO CAPS
0.8000 mg | ORAL_CAPSULE | Freq: Every day | ORAL | Status: DC
  Administered 2022-12-18 – 2022-12-30 (×13): 0.8 mg via ORAL
  Filled 2022-12-18 (×13): qty 2

## 2022-12-18 MED ORDER — PRIMIDONE 50 MG PO TABS
50.0000 mg | ORAL_TABLET | Freq: Two times a day (BID) | ORAL | Status: DC
  Administered 2022-12-18 – 2022-12-30 (×24): 50 mg via ORAL
  Filled 2022-12-18 (×27): qty 1

## 2022-12-18 NOTE — H&P (Addendum)
History and Physical  [name] ZOX:[id number] DOB: 16-Jul-1932 DOA: 12/18/2022  PCP: [name], NP   Chief Complaint: Fall  HPI: [name] is a 89 y.o. male with medical history significant for recent diagnosis of adenocarcinoma of the stomach planning to start Keytruda, BPH, hypertension, GERD, hyperlipidemia admitted to the hospital after mechanical fall at his nursing facility, resulting in a left foot fracture and found to have hyponatremia and acute renal failure.  Patient was discharged back to his assisted living facility after hospital stay from 5/16 to 5/20 due to complaints of epigastric discomfort, and upper GI bleeding likely secondary to gastric cancer.  During the hospital stay, he was transfused 2 units of PRBC but no other intervention was done.  Notably, he was seen by palliative care during that hospital stay and CODE STATUS was changed to DNR, he returned to assisted living facility with hospice.  Patient states that after returning back to his facility, for the last few days he has been having some tremors/shakes.  He typically ambulates with a walker at the facility, was doing that today when he fell to the ground onto his left foot.  Hospice nurse gave him some IV morphine before he came to the ER.  Denies losing consciousness, denies chest pain, or hitting his head.  Review of systems, patient denies any fevers, chills, chest pain, says that he has not been eating or drinking very much at the facility in the last few days.  ED Course: On evaluation in the ER, his vital signs are normal.  Lab work was pursued, significant for stable hemoglobin 11 (was 9 on last discharge), but new worsened hyponatremia 119, creatinine 2.40 from baseline 1.0.  Review of Systems: Please see HPI for pertinent positives and negatives. A complete 10 system review of systems are otherwise negative.  Past Medical History:  Diagnosis Date   Anxiety    Cataract     Depression    Diabetes mellitus    GERD (gastroesophageal reflux disease)    Hyperlipemia    Hypertension    Past Surgical History:  Procedure Laterality Date   CHOLECYSTECTOMY N/A 07/30/2019   Procedure: LAPAROSCOPIC CHOLECYSTECTOMY;  Surgeon: [name], [name], MD;  Location: [facility];  Service: General;  Laterality: N/A;   FRACTURE SURGERY     HERNIA REPAIR      Social History:  reports that he has never smoked. He has never used smokeless tobacco. He reports that he does not drink alcohol and does not use drugs.   No Known Allergies  Family History  Problem Relation Age of Onset   Lung cancer Brother    Colon cancer Neg Hx    Esophageal cancer Neg Hx    Rectal cancer Neg Hx    Stomach cancer Neg Hx      Prior to Admission medications   Medication Sig Start Date End Date Taking? Authorizing Provider  acetaminophen (TYLENOL) 500 MG tablet Take 500 mg by mouth every 6 (six) hours as needed for moderate pain or mild pain.    [provider]  buPROPion (WELLBUTRIN XL) 300 MG 24 hr tablet Take 300 mg by mouth daily. 11/14/16   [provider]  chlorhexidine (PERIDEX) 0.12 % solution Use as directed 15 mLs in the mouth or throat 2 (two) times daily.    [provider]  ferrous sulfate 325 (65 FE) MG EC tablet Take 1 tablet (325 mg total) by mouth daily with breakfast. 10/24/22   Heilingoetter,  [name], [name]  furosemide (LASIX) 20 MG tablet Take 1 tablet (20 mg total) by mouth daily. 11/17/22   [name], MD  gabapentin (NEURONTIN) 800 MG tablet Take 800 mg by mouth 2 (two) times daily. 06/19/22   [provider]  glipiZIDE (GLUCOTROL XL) 2.5 MG 24 hr tablet Take 2.5 mg by mouth daily with breakfast.    [provider]  lidocaine (LIDODERM) 5 % Place 1 patch onto the skin daily. Remove & Discard patch within 12 hours or as directed by MD ,Apply on right knee 11/17/22   [name], MD  loratadine (CLARITIN) 10 MG tablet Take 10  mg by mouth daily.    [provider]  LORazepam (ATIVAN) 0.5 MG tablet Take 1-2 tablets (0.5-1 mg total) by mouth at bedtime. Take 0.5 mg (1 tablet) by mouth every night at bedtime. May take an additional 0.5 mg (1 tablet) as needed. 11/15/22   [name], MD  losartan (COZAAR) 25 MG tablet Take 1 tablet (25 mg total) by mouth daily. 11/18/22   [name], MD  morphine (MSIR) 15 MG tablet Take 1 tablet (15 mg total) by mouth every 6 (six) hours as needed for severe pain. 11/15/22   [name], MD  Multiple Vitamin (MULITIVITAMIN WITH MINERALS) TABS Take 1 tablet by mouth daily.    [provider]  Omega-3 Fatty Acids (FISH OIL) 1000 MG CAPS Take 1,000 mg by mouth daily.    [provider]  pantoprazole (PROTONIX) 40 MG tablet Take 1 tablet (40 mg total) by mouth 2 (two) times daily. 11/15/22 12/15/22  [name], MD  polyethylene glycol (MIRALAX / GLYCOLAX) 17 g packet Take 17 g by mouth daily. 11/18/22   [name], MD  potassium chloride SA (KLOR-CON M) 20 MEQ tablet Take 1 tablet (20 mEq total) by mouth daily. 11/17/22   [name], MD  primidone (MYSOLINE) 50 MG tablet Take 50 mg by mouth 2 (two) times daily.    [provider]  senna-docusate (SENOKOT-S) 8.6-50 MG tablet Take 1 tablet by mouth 2 (two) times daily. 11/17/22   [name], MD  tamsulosin (FLOMAX) 0.4 MG CAPS capsule Take 0.8 mg by mouth daily after supper.    [provider]    Physical Exam: BP 104/69 (BP Location: Right Arm)   Pulse 86   Temp 98.1 F (36.7 C) (Oral)   Resp 17   SpO2 95%   General:  Alert, oriented, calm, in no acute distress  Eyes: EOMI, clear conjuctivae, white sclerea Neck: supple, no masses, trachea mildline  Cardiovascular: RRR, no murmurs or rubs, no peripheral edema  Respiratory: clear to auscultation bilaterally, no wheezes, no crackles  Abdomen: soft, nontender, nondistended, normal bowel tones heard  Skin: dry, no rashes   Musculoskeletal: no joint effusions, normal range of motion, left foot with plantar bruising and obvious 1st metatarsal closed fracture  Psychiatric: appropriate affect, normal speech  Neurologic: extraocular muscles intact, clear speech, moving all extremities with intact sensorium          Labs on Admission:  Basic Metabolic Panel: Recent Labs  Lab 12/18/22 0842 12/18/22 0859  NA 119* 121*  K 4.0 3.5  CL 77* 79*  CO2  --  31  GLUCOSE 159* 149*  BUN 35* 39*  CREATININE 2.40* 2.31*  CALCIUM  --  8.8*   Liver Function Tests: Recent Labs  Lab 12/18/22 0859  AST 35  ALT 49*  ALKPHOS 220*  BILITOT 0.5  PROT  6.2*  ALBUMIN 3.1*   No results for input(s): "LIPASE", "AMYLASE" in the last 168 hours. No results for input(s): "AMMONIA" in the last 168 hours. CBC: Recent Labs  Lab 12/18/22 0842 12/18/22 0859  WBC  --  7.7  NEUTROABS  --  5.7  HGB 11.6* 9.4*  HCT 34.0* 29.2*  MCV  --  83.0  PLT  --  331   Cardiac Enzymes: No results for input(s): "CKTOTAL", "CKMB", "CKMBINDEX", "TROPONINI" in the last 168 hours.  BNP (last 3 results) No results for input(s): "BNP" in the last 8760 hours.  ProBNP (last 3 results) No results for input(s): "PROBNP" in the last 8760 hours.  CBG: No results for input(s): "GLUCAP" in the last 168 hours.  Radiological Exams on Admission: CT Head Wo Contrast  Result Date: 12/18/2022 CLINICAL DATA:  Fall EXAM: CT HEAD WITHOUT CONTRAST TECHNIQUE: Contiguous axial images were obtained from the base of the skull through the vertex without intravenous contrast. RADIATION DOSE REDUCTION: This exam was performed according to the departmental dose-optimization program which includes automated exposure control, adjustment of the mA and/or kV according to patient size and/or use of iterative reconstruction technique. COMPARISON:  CT head 11/13/2022 FINDINGS: Brain: There is no acute intracranial hemorrhage, extra-axial fluid collection, or acute  infarct. Parenchymal volume loss with prominence of the ventricular system and extra-axial CSF spaces is unchanged. There is a hypodense left total convexity subdural collection measuring up to 4 mm in thickness without mass effect on the underlying brain parenchyma or midline shift, new since the prior study. No acute blood is seen within this collection. The pituitary and suprasellar region are normal. There is no mass lesion. There is no mass effect or midline shift. Vascular: There is calcification of the bilateral carotid siphons. Skull: Normal. Negative for fracture or focal lesion. Sinuses/Orbits: The imaged paranasal sinuses are clear. The imaged globes and orbits are unremarkable. Other: None. IMPRESSION: 1. No acute intracranial pathology. 2. Small left cerebral convexity subdural hygroma is new since the study from 11/23/2022. No mass effect on the brain parenchyma, midline shift, or acute blood. Electronically Signed   By: Lesia Hausen M.D.   On: 12/18/2022 09:24   DG Foot Complete Left  Result Date: 12/18/2022 CLINICAL DATA:  Trauma, fall EXAM: LEFT FOOT - COMPLETE 3+ VIEW COMPARISON:  None Available. FINDINGS: There is dislocation in first metatarsophalangeal joint. There is possible tiny avulsion adjacent to the medial aspect of head of the first metatarsal. There is displaced fracture in the distal shaft of second metatarsal with 3 mm offset in alignment of fracture fragments. There is undisplaced fracture in the distal shaft of third metatarsal. There is small linear calcific density adjacent to the lateral aspect of anterior calcaneus suggesting recent or old avulsion. There is faint lucency in the base of proximal phalanx of the second toe. Degenerative changes are noted in intertarsal joints between the navicular and medial cuneiform. There is marked soft tissue swelling over the dorsum. IMPRESSION: Fracture dislocation is seen in the left first metatarsophalangeal joint. There is displaced  fracture in the distal shaft of second metatarsal. Undisplaced fracture is seen in the distal shaft of third metatarsal. There is tiny avulsion fracture in the lateral aspect of anterior calcaneus which may be recent or old. There is faint lucency in the base of proximal phalanx of second toe which may be an artifact or suggest a nondisplaced fracture. Electronically Signed   By: Ernie Avena M.D.   On: 12/18/2022 08:33  DG Ribs Unilateral W/Chest Left  Result Date: 12/18/2022 CLINICAL DATA:  Trauma, fall EXAM: LEFT RIBS AND CHEST - 3+ VIEW COMPARISON:  Chest radiograph done on 11/13/2022 FINDINGS: No displaced fractures are seen in left ribs. AP supine view of the chest is technically less than optimal to evaluate the lung fields. As far as seen, there is no evidence of large pleural effusion or pneumothorax. IMPRESSION: No displaced fracture is seen in left ribs. Less than optimal evaluation of lung fields in the chest radiograph. As far as seen, there are no focal infiltrates or pleural effusion or pneumothorax. Electronically Signed   By: Ernie Avena M.D.   On: 12/18/2022 08:28   DG Ankle Complete Left  Result Date: 12/18/2022 CLINICAL DATA:  Trauma, fall EXAM: LEFT ANKLE COMPLETE - 3+ VIEW COMPARISON:  None Available. FINDINGS: No fracture or dislocation is seen in left ankle. At the edge of the field-of-view, fracture is noted in the distal shaft of second metatarsal. This finding will be better evaluated in the images of the left foot. There is soft tissue swelling over the dorsal aspect. Bony spurs are noted in the dorsal aspect of intertarsal and tarsometatarsal joints. IMPRESSION: Fracture is seen in the distal shaft of second metatarsal. No fracture or dislocation is seen in left ankle. Electronically Signed   By: Ernie Avena M.D.   On: 12/18/2022 08:23    Assessment/Plan Regal Stanifer is a 89 y.o. male with medical history significant for recent diagnosis of  adenocarcinoma of the stomach planning to start Keytruda, BPH, hypertension, GERD, hyperlipidemia admitted to the hospital after mechanical fall at his nursing facility, resulting in a left foot fracture and found to have hyponatremia and acute renal failure.   Hyponatremia-this is likely hypovolemic hyponatremia due to reduced oral intake, and may explain his weakness and the shakes that he describes.  Probably exacerbated by Lasix.  No headache, confusion, or other worrisome symptoms. -Inpatient admission -Continue normal saline infusion -Trend sodium every 6 hours -Will check serum osmolality, urinalysis, urine sodium  Acute renal failure-patient with baseline normal renal function, likely has acute renal failure due to dehydration from reduced oral intake per the patient. -Avoid nephrotoxins (hold Cozaar and Lasix) -Hydrate as above -Follow renal function daily, consider renal ultrasound if not improving  Peripheral neuropathy-continue gabapentin at reduced dose due to AKI  Left foot fracture-after mechanical fall at his assisted living facility today -Pain control with morphine IR -EDP has discussed with orthopedic surgery who will consult -Will keep him n.p.o. until plans are clarified  Gastric adenocarcinoma-this was recently diagnosed April 2024 after EGD.  After extensive discussion with Dr. Mosetta Putt 11/28/2022, patient declined all treatments and opted for hospice.  Chronic anemia-likely due to known gastric adenocarcinoma, hemoglobin appears stable  Depression-continue home Wellbutrin  DM2-hold glipizide, diabetic diet when eating, sliding scale insulin   DVT prophylaxis: SCDs, avoiding Lovenox with history of gastric cancer and bleeding.     Code Status: DNR, confirmed this with the patient again this AM.  Consults called: EDP discussed with Orthopedics  Admission status: The appropriate patient status for this patient is INPATIENT. Inpatient status is judged to be  reasonable and necessary in order to provide the required intensity of service to ensure the patient's safety. The patient's presenting symptoms, physical exam findings, and initial radiographic and laboratory data in the context of their chronic comorbidities is felt to place them at high risk for further clinical deterioration. Furthermore, it is not anticipated that the patient  will be medically stable for discharge from the hospital within 2 midnights of admission.    I certify that at the point of admission it is my clinical judgment that the patient will require inpatient hospital care spanning beyond 2 midnights from the point of admission due to high intensity of service, high risk for further deterioration and high frequency of surveillance required  Time spent: 49 minutes  Olga Seyler Sharlette Dense MD [facility] Pager [phone]  If 7PM-7AM, please contact night-coverage [email or web address] Password [facility]  12/18/2022, 10:27 AM

## 2022-12-18 NOTE — ED Notes (Signed)
Bladder scan showed approx 330cc

## 2022-12-18 NOTE — ED Triage Notes (Signed)
Pt reports starting to have 'jerking' about a week ago, worse in the last 2 days. Reports nerve issues

## 2022-12-18 NOTE — ED Notes (Signed)
ED TO INPATIENT HANDOFF REPORT  ED Nurse Name and Phone #: Thurmon Fair. Manson Passey, RN  S Name/Age/Gender [name] 89 y.o. male Room/Bed: WA05/WA05  Code Status   Code Status: DNR  Home/SNF/Other Home Patient oriented to: self, place, time, and situation Is this baseline? Yes   Triage Complete: Triage complete  Chief Complaint Hyponatremia [E87.1]  Triage Note Pt arrives with [facility] from [facility] for mechanical fall around 0630. Deformity to left foot esp toes, bruising and swelling notes. No LOC, no thinners. Reports left side pain. Hx cancer, on hospice. Hospice nurse gave 15mg  morphine PTA.  144/72 HR 71 95% RA  Pt reports starting to have 'jerking' about a week ago, worse in the last 2 days. Reports nerve issues   Allergies No Known Allergies  Level of Care/Admitting Diagnosis ED Disposition     ED Disposition  Admit   Condition  --   Comment  Hospital Area: [facility] [[id number]]  Level of Care: Med-Surg [16]  May admit patient to [facility] or [facility] if equivalent level of care is available:: Yes  Covid Evaluation: Asymptomatic - no recent exposure (last 10 days) testing not required  Diagnosis: Hyponatremia [198519]  Admitting Physician: Maryln Gottron [[id number]]  Attending Physician: Kirby Crigler, MIR Jaxson.Roy [[id number]]  Certification:: I certify this patient will need inpatient services for at least 2 midnights  Estimated Length of Stay: 3          B Medical/Surgery History Past Medical History:  Diagnosis Date   Anxiety    Cataract    Depression    Diabetes mellitus    GERD (gastroesophageal reflux disease)    Hyperlipemia    Hypertension    Past Surgical History:  Procedure Laterality Date   CHOLECYSTECTOMY N/A 07/30/2019   Procedure: LAPAROSCOPIC CHOLECYSTECTOMY;  Surgeon: [name], [name], MD;  Location: [facility];  Service: General;  Laterality: N/A;   FRACTURE SURGERY     HERNIA REPAIR       A IV  Location/Drains/Wounds Patient Lines/Drains/Airways Status     Active Line/Drains/Airways     Name Placement date Placement time Site Days   Peripheral IV 11/14/22 22 G Anterior;Distal;Left Forearm 11/14/22  2321  Forearm  34   Peripheral IV 12/18/22 20 G 1.25" Anterior;Distal;Right;Upper Arm 12/18/22  0837  Arm  less than 1   Incision - 4 Ports Abdomen 1: Umbilicus;Upper 2: Superior;Umbilicus 3: Right;Medial 4: Right;Lateral 07/30/19  1020  -- 1237            Intake/Output Last 24 hours No intake or output data in the 24 hours ending 12/18/22 1206  Labs/Imaging Results for orders placed or performed during the hospital encounter of 12/18/22 (from the past 48 hour(s))  I-stat chem 8, ed     Status: Abnormal   Collection Time: 12/18/22  8:42 AM  Result Value Ref Range   Sodium 119 (LL) 135 - 145 mmol/L   Potassium 4.0 3.5 - 5.1 mmol/L   Chloride 77 (L) 98 - 111 mmol/L   BUN 35 (H) 8 - 23 mg/dL   Creatinine, Ser 7.82 (H) 0.61 - 1.24 mg/dL   Glucose, Bld 956 (H) 70 - 99 mg/dL    Comment: Glucose reference range applies only to samples taken after fasting for at least 8 hours.   Calcium, Ion 1.17 1.15 - 1.40 mmol/L   TCO2 33 (H) 22 - 32 mmol/L   Hemoglobin 11.6 (L) 13.0 - 17.0 g/dL   HCT 21.3 (  L) 39.0 - 52.0 %   Comment NOTIFIED PHYSICIAN   CBC with Differential/Platelet     Status: Abnormal   Collection Time: 12/18/22  8:59 AM  Result Value Ref Range   WBC 7.7 4.0 - 10.5 K/uL   RBC 3.52 (L) 4.22 - 5.81 MIL/uL   Hemoglobin 9.4 (L) 13.0 - 17.0 g/dL   HCT 65.7 (L) 84.6 - 96.2 %   MCV 83.0 80.0 - 100.0 fL   MCH 26.7 26.0 - 34.0 pg   MCHC 32.2 30.0 - 36.0 g/dL   RDW 95.2 84.1 - 32.4 %   Platelets 331 150 - 400 K/uL   nRBC 0.0 0.0 - 0.2 %   Neutrophils Relative % 75 %   Neutro Abs 5.7 1.7 - 7.7 K/uL   Lymphocytes Relative 14 %   Lymphs Abs 1.1 0.7 - 4.0 K/uL   Monocytes Relative 10 %   Monocytes Absolute 0.7 0.1 - 1.0 K/uL   Eosinophils Relative 1 %   Eosinophils  Absolute 0.1 0.0 - 0.5 K/uL   Basophils Relative 0 %   Basophils Absolute 0.0 0.0 - 0.1 K/uL   Immature Granulocytes 0 %   Abs Immature Granulocytes 0.03 0.00 - 0.07 K/uL    Comment: Performed at [facility], [address] [place], [place] [address]  Comprehensive metabolic panel     Status: Abnormal   Collection Time: 12/18/22  8:59 AM  Result Value Ref Range   Sodium 121 (L) 135 - 145 mmol/L   Potassium 3.5 3.5 - 5.1 mmol/L   Chloride 79 (L) 98 - 111 mmol/L   CO2 31 22 - 32 mmol/L   Glucose, Bld 149 (H) 70 - 99 mg/dL    Comment: Glucose reference range applies only to samples taken after fasting for at least 8 hours.   BUN 39 (H) 8 - 23 mg/dL   Creatinine, Ser 7.25 (H) 0.61 - 1.24 mg/dL   Calcium 8.8 (L) 8.9 - 10.3 mg/dL   Total Protein 6.2 (L) 6.5 - 8.1 g/dL   Albumin 3.1 (L) 3.5 - 5.0 g/dL   AST 35 15 - 41 U/L   ALT 49 (H) 0 - 44 U/L   Alkaline Phosphatase 220 (H) 38 - 126 U/L   Total Bilirubin 0.5 0.3 - 1.2 mg/dL   GFR, Estimated 27 (L) >60 mL/min    Comment: (NOTE) Calculated using the CKD-EPI Creatinine Equation (2021)    Anion gap 11 5 - 15    Comment: Performed at [facility], [address] [place], [place] [address]  CBG monitoring, ED     Status: Abnormal   Collection Time: 12/18/22 11:51 AM  Result Value Ref Range   Glucose-Capillary 144 (H) 70 - 99 mg/dL    Comment: Glucose reference range applies only to samples taken after fasting for at least 8 hours.   DG Foot 2 Views Left  Result Date: 12/18/2022 CLINICAL DATA:  Postreduction of the great toe EXAM: LEFT FOOT - 2 VIEW COMPARISON:  Foot radiograph dated June 20, 2020z FINDINGS: Postreduction films demonstrate improved anatomic alignment of the left first MTP joint. Redemonstrated multiple fractures involving the metatarsals and phalanges. Redemonstrated displaced fracture of the distal second metatarsal. Probable additional minimally displaced fracture of the distal  first metatarsal which was nodule visualized on prior. Previously described distal third metatarsal fracture not well seen. Probable small fractures of the base of the second and third proximal phalanges. Redemonstrated probable lateral calcaneus fracture. Moderate degenerative  changes of the midfoot. Soft tissue edema of the foot. IMPRESSION: Postreduction films demonstrate improved anatomic alignment of the left first MTP joint. Electronically Signed   By: Allegra Lai M.D.   On: 12/18/2022 11:49   CT Head Wo Contrast  Result Date: 12/18/2022 CLINICAL DATA:  Fall EXAM: CT HEAD WITHOUT CONTRAST TECHNIQUE: Contiguous axial images were obtained from the base of the skull through the vertex without intravenous contrast. RADIATION DOSE REDUCTION: This exam was performed according to the departmental dose-optimization program which includes automated exposure control, adjustment of the mA and/or kV according to patient size and/or use of iterative reconstruction technique. COMPARISON:  CT head 11/13/2022 FINDINGS: Brain: There is no acute intracranial hemorrhage, extra-axial fluid collection, or acute infarct. Parenchymal volume loss with prominence of the ventricular system and extra-axial CSF spaces is unchanged. There is a hypodense left total convexity subdural collection measuring up to 4 mm in thickness without mass effect on the underlying brain parenchyma or midline shift, new since the prior study. No acute blood is seen within this collection. The pituitary and suprasellar region are normal. There is no mass lesion. There is no mass effect or midline shift. Vascular: There is calcification of the bilateral carotid siphons. Skull: Normal. Negative for fracture or focal lesion. Sinuses/Orbits: The imaged paranasal sinuses are clear. The imaged globes and orbits are unremarkable. Other: None. IMPRESSION: 1. No acute intracranial pathology. 2. 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 No mass effect on the brain parenchyma, midline shift, or acute blood. Electronically Signed   By: Lesia Hausen M.D.   On: 12/18/2022 09:24   DG Foot Complete Left  Result Date: 12/18/2022 CLINICAL DATA:  Trauma, fall EXAM: LEFT FOOT - COMPLETE 3+ VIEW COMPARISON:  None Available. FINDINGS: There is dislocation in first metatarsophalangeal joint. There is possible tiny avulsion adjacent to the medial aspect of head of the first metatarsal. There is displaced fracture in the distal shaft of second metatarsal with 3 mm offset in alignment of fracture fragments. There is undisplaced fracture in the distal shaft of third metatarsal. There is small linear calcific density adjacent to the lateral aspect of anterior calcaneus suggesting recent or old avulsion. There is faint lucency in the base of proximal phalanx of the second toe. Degenerative changes are noted in intertarsal joints between the navicular and medial cuneiform. There is marked soft tissue swelling over the dorsum. IMPRESSION: Fracture dislocation is seen in the left first metatarsophalangeal joint. There is displaced fracture in the distal shaft of second metatarsal. Undisplaced fracture is seen in the distal shaft of third metatarsal. There is tiny avulsion fracture in the lateral aspect of anterior calcaneus which may be recent or old. There is faint lucency in the base of proximal phalanx of second toe which may be an artifact or suggest a nondisplaced fracture. Electronically Signed   By: Ernie Avena M.D.   On: 12/18/2022 08:33   DG Ribs Unilateral W/Chest Left  Result Date: 12/18/2022 CLINICAL DATA:  Trauma, fall EXAM: LEFT RIBS AND CHEST - 3+ VIEW COMPARISON:  Chest radiograph done on 11/13/2022 FINDINGS: No displaced fractures are seen in left ribs. AP supine view of the chest is technically less than optimal to evaluate the lung fields. As far as seen, there is no evidence of large pleural effusion or  pneumothorax. IMPRESSION: No displaced fracture is seen in left ribs. Less than optimal evaluation of lung fields in the chest radiograph. As far as  seen, there are no focal infiltrates or pleural effusion or pneumothorax. Electronically Signed   By: Ernie Avena M.D.   On: 12/18/2022 08:28   DG Ankle Complete Left  Result Date: 12/18/2022 CLINICAL DATA:  Trauma, fall EXAM: LEFT ANKLE COMPLETE - 3+ VIEW COMPARISON:  None Available. FINDINGS: No fracture or dislocation is seen in left ankle. At the edge of the field-of-view, fracture is noted in the distal shaft of second metatarsal. This finding will be better evaluated in the images of the left foot. There is soft tissue swelling over the dorsal aspect. Bony spurs are noted in the dorsal aspect of intertarsal and tarsometatarsal joints. IMPRESSION: Fracture is seen in the distal shaft of second metatarsal. No fracture or dislocation is seen in left ankle. Electronically Signed   By: Ernie Avena M.D.   On: 12/18/2022 08:23    Pending Labs Unresulted Labs (From admission, onward)     Start     Ordered   12/19/22 0500  CBC  Tomorrow morning,   R        12/18/22 1026   12/19/22 0500  Basic metabolic panel  Tomorrow morning,   R        12/18/22 1026   12/18/22 1038  Urinalysis, Routine w reflex microscopic -Urine, Clean Catch  Once,   R       Question:  Specimen Source  Answer:  Urine, Clean Catch   12/18/22 1037   12/18/22 1038  Osmolality  Once,   R        12/18/22 1037   12/18/22 1038  Sodium, urine, random  Once,   R        12/18/22 1037   12/18/22 1024  Basic metabolic panel  Now then every 6 hours,   R (with STAT occurrences)      12/18/22 1023            Vitals/Pain Today's Vitals   12/18/22 0731 12/18/22 0735 12/18/22 1119  BP: 104/69    Pulse: 86    Resp: 17    Temp: 98.1 F (36.7 C)  98.2 F (36.8 C)  TempSrc: Oral  Oral  SpO2: 95%    PainSc:  7      Isolation Precautions No active  isolations  Medications Medications  0.9 %  sodium chloride infusion ( Intravenous New Bag/Given 12/18/22 1156)  morphine (MSIR) tablet 15 mg (has no administration in time range)  buPROPion (WELLBUTRIN XL) 24 hr tablet 300 mg (has no administration in time range)  LORazepam (ATIVAN) tablet 0.5-1 mg (has no administration in time range)  pantoprazole (PROTONIX) EC tablet 40 mg (has no administration in time range)  polyethylene glycol (MIRALAX / GLYCOLAX) packet 17 g (has no administration in time range)  senna-docusate (Senokot-S) tablet 1 tablet (has no administration in time range)  tamsulosin (FLOMAX) capsule 0.8 mg (has no administration in time range)  ferrous sulfate EC tablet 325 mg (has no administration in time range)  gabapentin (NEURONTIN) tablet 400 mg (has no administration in time range)  primidone (MYSOLINE) tablet 50 mg (has no administration in time range)  loratadine (CLARITIN) tablet 10 mg (has no administration in time range)  insulin aspart (novoLOG) injection 0-15 Units (has no administration in time range)  insulin aspart (novoLOG) injection 0-5 Units (has no administration in time range)  acetaminophen (TYLENOL) tablet 650 mg (has no administration in time range)    Or  acetaminophen (TYLENOL) suppository 650 mg (has no administration in time range)  ondansetron (ZOFRAN) tablet 4 mg (has no administration in time range)    Or  ondansetron (ZOFRAN) injection 4 mg (has no administration in time range)  albuterol (PROVENTIL) (2.5 MG/3ML) 0.083% nebulizer solution 2.5 mg (has no administration in time range)  bupivacaine (PF) (MARCAINE) 0.25 % injection 10 mL (has no administration in time range)  lidocaine (PF) (XYLOCAINE) 1 % injection (has no administration in time range)    Mobility walks with device     Focused Assessments Metatarsal fx L foot. Orhtoboot present for weightbearing.    R Recommendations: See Admitting Provider Note  Report given to:    Additional Notes: A/Ox4, independent

## 2022-12-18 NOTE — ED Provider Notes (Addendum)
[name] EMERGENCY DEPARTMENT AT [facility] Provider Note   CSN: [id number] Arrival date & time: 12/18/22  4098     History  Chief Complaint  Patient presents with   Fall   Foot Pain    [name] is a 89 y.o. male.  89 year old male presents after mechanical fall just prior to arrival.  Patient states that he uses a walker normally and has had some rhythmic shaking for about a week now.  States that he fell onto his left foot and ankle.  Did not strike his head did not have any loss of consciousness.  No recent illnesses.  Took morphine prior to arrival which did relieve her symptoms.  Notes some left rib pain but denies any dyspnea.  No recent focal weakness.       Home Medications Prior to Admission medications   Medication Sig Start Date End Date Taking? Authorizing Provider  acetaminophen (TYLENOL) 500 MG tablet Take 500 mg by mouth every 6 (six) hours as needed for moderate pain or mild pain.    [provider]  buPROPion (WELLBUTRIN XL) 300 MG 24 hr tablet Take 300 mg by mouth daily. 11/14/16   [provider]  chlorhexidine (PERIDEX) 0.12 % solution Use as directed 15 mLs in the mouth or throat 2 (two) times daily.    [provider]  ferrous sulfate 325 (65 FE) MG EC tablet Take 1 tablet (325 mg total) by mouth daily with breakfast. 10/24/22   Heilingoetter, [name], [name]  furosemide (LASIX) 20 MG tablet Take 1 tablet (20 mg total) by mouth daily. 11/17/22   [name], MD  gabapentin (NEURONTIN) 800 MG tablet Take 800 mg by mouth 2 (two) times daily. 06/19/22   [provider]  glipiZIDE (GLUCOTROL XL) 2.5 MG 24 hr tablet Take 2.5 mg by mouth daily with breakfast.    [provider]  lidocaine (LIDODERM) 5 % Place 1 patch onto the skin daily. Remove & Discard patch within 12 hours or as directed by MD ,Apply on right knee 11/17/22   [name], MD  loratadine (CLARITIN) 10 MG tablet Take  10 mg by mouth daily.    [provider]  LORazepam (ATIVAN) 0.5 MG tablet Take 1-2 tablets (0.5-1 mg total) by mouth at bedtime. Take 0.5 mg (1 tablet) by mouth every night at bedtime. May take an additional 0.5 mg (1 tablet) as needed. 11/15/22   [name], MD  losartan (COZAAR) 25 MG tablet Take 1 tablet (25 mg total) by mouth daily. 11/18/22   [name], MD  morphine (MSIR) 15 MG tablet Take 1 tablet (15 mg total) by mouth every 6 (six) hours as needed for severe pain. 11/15/22   [name], MD  Multiple Vitamin (MULITIVITAMIN WITH MINERALS) TABS Take 1 tablet by mouth daily.    [provider]  Omega-3 Fatty Acids (FISH OIL) 1000 MG CAPS Take 1,000 mg by mouth daily.    [provider]  pantoprazole (PROTONIX) 40 MG tablet Take 1 tablet (40 mg total) by mouth 2 (two) times daily. 11/15/22 12/15/22  [name], MD  polyethylene glycol (MIRALAX / GLYCOLAX) 17 g packet Take 17 g by mouth daily. 11/18/22   [name], MD  potassium chloride SA (KLOR-CON M) 20 MEQ tablet Take 1 tablet (20 mEq total) by mouth daily. 11/17/22   [name], MD  primidone (MYSOLINE) 50 MG tablet Take 50 mg by mouth 2 (two) times daily.  [provider]  senna-docusate (SENOKOT-S) 8.6-50 MG tablet Take 1 tablet by mouth 2 (two) times daily. 11/17/22   [name], MD  tamsulosin (FLOMAX) 0.4 MG CAPS capsule Take 0.8 mg by mouth daily after supper.    [provider]      Allergies    Patient has no known allergies.    Review of Systems   Review of Systems  All other systems reviewed and are negative.   Physical Exam Updated Vital Signs BP 104/69 (BP Location: Right Arm)   Pulse 86   Temp 98.1 F (36.7 C) (Oral)   Resp 17   SpO2 95%  Physical Exam Vitals and nursing note reviewed.  Constitutional:      General: He is not in acute distress.    Appearance: Normal appearance. He is well-developed. He is not toxic-appearing.  HENT:      Head: Normocephalic and atraumatic.  Eyes:     General: Lids are normal.     Conjunctiva/sclera: Conjunctivae normal.     Pupils: Pupils are equal, round, and reactive to light.  Neck:     Thyroid: No thyroid mass.     Trachea: No tracheal deviation.  Cardiovascular:     Rate and Rhythm: Normal rate and regular rhythm.     Heart sounds: Normal heart sounds. No murmur heard.    No gallop.  Pulmonary:     Effort: Pulmonary effort is normal. No respiratory distress.     Breath sounds: Normal breath sounds. No stridor. No decreased breath sounds, wheezing, rhonchi or rales.  Chest:     Chest wall: Tenderness present. No deformity.    Abdominal:     General: There is no distension.     Palpations: Abdomen is soft.     Tenderness: There is no abdominal tenderness. There is no rebound.  Musculoskeletal:        General: Normal range of motion.     Cervical back: Normal range of motion and neck supple.     Left ankle: Tenderness present.     Left foot: Swelling present.     Comments: Ecchymosis noted to dorsum of left foot.  Great toe and second toe deviated inwards.  Patient states this is normal for him  Skin:    General: Skin is warm and dry.     Findings: No abrasion or rash.  Neurological:     General: No focal deficit present.     Mental Status: He is alert and oriented to person, place, and time. Mental status is at baseline.     GCS: GCS eye subscore is 4. GCS verbal subscore is 5. GCS motor subscore is 6.     Cranial Nerves: No cranial nerve deficit.     Sensory: No sensory deficit.     Motor: Motor function is intact.  Psychiatric:        Attention and Perception: Attention normal.        Speech: Speech normal.        Behavior: Behavior normal.     ED Results / Procedures / Treatments   Labs (all labs ordered are listed, but only abnormal results are displayed) Labs Reviewed - No data to display  EKG None  Radiology No results found.  Procedures Reduction of  dislocation  Date/Time: 12/18/2022 12:01 PM  Performed by: Lorre Nick, MD Authorized by: Lorre Nick, MD  Consent: Verbal consent obtained. Imaging studies: imaging studies available Patient identity confirmed: verbally with patient Time out: Immediately  prior to procedure a "time out" was called to verify the correct patient, procedure, equipment, support staff and site/side marked as required. Preparation: Patient was prepped and draped in the usual sterile fashion. Local anesthesia used: yes Anesthesia: nerve block  Anesthesia: Local anesthesia used: yes Local Anesthetic: lidocaine 1% without epinephrine Anesthetic total: 6 mL  Sedation: Patient sedated: no  Patient tolerance: patient tolerated the procedure well with no immediate complications       Medications Ordered in ED Medications - No data to display  ED Course/ Medical Decision Making/ A&P                             Medical Decision Making Amount and/or Complexity of Data Reviewed Labs: ordered. Radiology: ordered.  Risk Prescription drug management. Decision regarding hospitalization.   Patient here with tremors and fall just prior to arrival.  Patient head CT due to the history of tremors and no significant acute abnormality was noted.  X-ray of left ribs and pelvis was negative for interpretation.  Has evidence of hyponatremia as well as acute kidney injury.  Will be given IV fluids.  X-ray of left foot and ankle was positive for fracture dislocation of the first MTP joint.  Patient also has displaced fracture at the distal shaft of second metatarsal.  Also has a undisplaced fracture of the distal shaft of the third metatarsal.  Discussed with orthopedics team see the patient consultation.  Discussed with hospitalist and patient to be admitted.  Patient's toe reduced and postreduction x-rays show good alignment.  Placed into a hard boot.        Final Clinical Impression(s) / ED  Diagnoses Final diagnoses:  None    Rx / DC Orders ED Discharge Orders     None         Lorre Nick, MD 12/18/22 1023    Lorre Nick, MD 12/18/22 1200    Lorre Nick, MD 12/18/22 1202

## 2022-12-18 NOTE — Consult Note (Signed)
Chief Complaint: Left foot deformity and pain  History: 89 year old male presents after mechanical fall just prior to arrival. Patient states that he uses a walker normally and has had some rhythmic shaking for about a week now. States that he fell onto his left foot and ankle. Did not strike his head did not have any loss of consciousness. No recent illnesses. Took morphine prior to arrival which did relieve her symptoms. Notes some left rib pain but denies any dyspnea. No recent focal weakness.   No loss of consciousness, dizziness, headaches.  No recent nausea or vomiting.  Past Medical History:  Diagnosis Date   Anxiety    Cataract    Depression    Diabetes mellitus    GERD (gastroesophageal reflux disease)    Hyperlipemia    Hypertension     No Known Allergies  No current facility-administered medications on file prior to encounter.   Current Outpatient Medications on File Prior to Encounter  Medication Sig Dispense Refill   acetaminophen (TYLENOL) 500 MG tablet Take 500 mg by mouth every 6 (six) hours as needed for moderate pain or mild pain.     buPROPion (WELLBUTRIN XL) 300 MG 24 hr tablet Take 300 mg by mouth daily.  6   chlorhexidine (PERIDEX) 0.12 % solution Use as directed 15 mLs in the mouth or throat 2 (two) times daily.     ferrous sulfate 325 (65 FE) MG EC tablet Take 1 tablet (325 mg total) by mouth daily with breakfast. 30 tablet 3   furosemide (LASIX) 20 MG tablet Take 1 tablet (20 mg total) by mouth daily. 30 tablet 0   gabapentin (NEURONTIN) 800 MG tablet Take 800 mg by mouth 2 (two) times daily.     glipiZIDE (GLUCOTROL XL) 2.5 MG 24 hr tablet Take 2.5 mg by mouth daily with breakfast.     lidocaine (LIDODERM) 5 % Place 1 patch onto the skin daily. Remove & Discard patch within 12 hours or as directed by MD ,Apply on right knee 30 patch 0   loratadine (CLARITIN) 10 MG tablet Take 10 mg by mouth daily.     LORazepam (ATIVAN) 0.5 MG tablet Take 1-2 tablets  (0.5-1 mg total) by mouth at bedtime. Take 0.5 mg (1 tablet) by mouth every night at bedtime. May take an additional 0.5 mg (1 tablet) as needed. 30 tablet 0   losartan (COZAAR) 25 MG tablet Take 1 tablet (25 mg total) by mouth daily. 30 tablet 0   morphine (MSIR) 15 MG tablet Take 1 tablet (15 mg total) by mouth every 6 (six) hours as needed for severe pain. 30 tablet 0   Multiple Vitamin (MULITIVITAMIN WITH MINERALS) TABS Take 1 tablet by mouth daily.     Omega-3 Fatty Acids (FISH OIL) 1000 MG CAPS Take 1,000 mg by mouth daily.     pantoprazole (PROTONIX) 40 MG tablet Take 1 tablet (40 mg total) by mouth 2 (two) times daily. 60 tablet 0   polyethylene glycol (MIRALAX / GLYCOLAX) 17 g packet Take 17 g by mouth daily. 14 each 0   potassium chloride SA (KLOR-CON M) 20 MEQ tablet Take 1 tablet (20 mEq total) by mouth daily. 30 tablet 0   primidone (MYSOLINE) 50 MG tablet Take 50 mg by mouth 2 (two) times daily.     senna-docusate (SENOKOT-S) 8.6-50 MG tablet Take 1 tablet by mouth 2 (two) times daily. 30 tablet 0   tamsulosin (FLOMAX) 0.4 MG CAPS capsule Take 0.8  mg by mouth daily after supper.      Physical Exam: Vitals:   12/18/22 1223 12/18/22 1329  BP: (!) 129/53 (!) 150/58  Pulse: 70 (!) 46  Resp: 17 16  Temp: 97.6 F (36.4 C) 98.6 F (37 C)  SpO2: 100% (!) 81%   Body mass index is 29.02 kg/m. Alert and oriented x 3 No shortness of breath or chest pain Abdomen soft and nontender. Left lower extremity: 2+ dorsalis pedis/posterior tibialis pulses bilaterally.  Foot and calf compartments are soft and nontender.  No significant swelling.  Sensation light touch is intact.  Tenderness to palpation over the first and second metatarsals.  Image: DG Foot 2 Views Left  Result Date: 12/18/2022 CLINICAL DATA:  Postreduction of the great toe EXAM: LEFT FOOT - 2 VIEW COMPARISON:  Foot radiograph dated June 20, 2020z FINDINGS: Postreduction films demonstrate improved anatomic alignment of the  left first MTP joint. Redemonstrated multiple fractures involving the metatarsals and phalanges. Redemonstrated displaced fracture of the distal second metatarsal. Probable additional minimally displaced fracture of the distal first metatarsal which was nodule visualized on prior. Previously described distal third metatarsal fracture not well seen. Probable small fractures of the base of the second and third proximal phalanges. Redemonstrated probable lateral calcaneus fracture. Moderate degenerative changes of the midfoot. Soft tissue edema of the foot. IMPRESSION: Postreduction films demonstrate improved anatomic alignment of the left first MTP joint. Electronically Signed   By: Allegra Lai M.D.   On: 12/18/2022 11:49   CT Head Wo Contrast  Result Date: 12/18/2022 CLINICAL DATA:  Fall EXAM: CT HEAD WITHOUT CONTRAST TECHNIQUE: Contiguous axial images were obtained from the base of the skull through the vertex without intravenous contrast. RADIATION DOSE REDUCTION: This exam was performed according to the departmental dose-optimization program which includes automated exposure control, adjustment of the mA and/or kV according to patient size and/or use of iterative reconstruction technique. COMPARISON:  CT head 11/13/2022 FINDINGS: Brain: There is no acute intracranial hemorrhage, extra-axial fluid collection, or acute infarct. Parenchymal volume loss with prominence of the ventricular system and extra-axial CSF spaces is unchanged. There is a hypodense left total convexity subdural collection measuring up to 4 mm in thickness without mass effect on the underlying brain parenchyma or midline shift, new since the prior study. No acute blood is seen within this collection. The pituitary and suprasellar region are normal. There is no mass lesion. There is no mass effect or midline shift. Vascular: There is calcification of the bilateral carotid siphons. Skull: Normal. Negative for fracture or focal lesion.  Sinuses/Orbits: The imaged paranasal sinuses are clear. The imaged globes and orbits are unremarkable. Other: None. IMPRESSION: 1. No acute intracranial pathology. 2. Small left cerebral convexity subdural hygroma is new since the study from 11/23/2022. No mass effect on the brain parenchyma, midline shift, or acute blood. Electronically Signed   By: Lesia Hausen M.D.   On: 12/18/2022 09:24   DG Foot Complete Left  Result Date: 12/18/2022 CLINICAL DATA:  Trauma, fall EXAM: LEFT FOOT - COMPLETE 3+ VIEW COMPARISON:  None Available. FINDINGS: There is dislocation in first metatarsophalangeal joint. There is possible tiny avulsion adjacent to the medial aspect of head of the first metatarsal. There is displaced fracture in the distal shaft of second metatarsal with 3 mm offset in alignment of fracture fragments. There is undisplaced fracture in the distal shaft of third metatarsal. There is small linear calcific density adjacent to the lateral aspect of anterior calcaneus suggesting recent or  old avulsion. There is faint lucency in the base of proximal phalanx of the second toe. Degenerative changes are noted in intertarsal joints between the navicular and medial cuneiform. There is marked soft tissue swelling over the dorsum. IMPRESSION: Fracture dislocation is seen in the left first metatarsophalangeal joint. There is displaced fracture in the distal shaft of second metatarsal. Undisplaced fracture is seen in the distal shaft of third metatarsal. There is tiny avulsion fracture in the lateral aspect of anterior calcaneus which may be recent or old. There is faint lucency in the base of proximal phalanx of second toe which may be an artifact or suggest a nondisplaced fracture. Electronically Signed   By: Ernie Avena M.D.   On: 12/18/2022 08:33   DG Ribs Unilateral W/Chest Left  Result Date: 12/18/2022 CLINICAL DATA:  Trauma, fall EXAM: LEFT RIBS AND CHEST - 3+ VIEW COMPARISON:  Chest radiograph done on  11/13/2022 FINDINGS: No displaced fractures are seen in left ribs. AP supine view of the chest is technically less than optimal to evaluate the lung fields. As far as seen, there is no evidence of large pleural effusion or pneumothorax. IMPRESSION: No displaced fracture is seen in left ribs. Less than optimal evaluation of lung fields in the chest radiograph. As far as seen, there are no focal infiltrates or pleural effusion or pneumothorax. Electronically Signed   By: Ernie Avena M.D.   On: 12/18/2022 08:28   DG Ankle Complete Left  Result Date: 12/18/2022 CLINICAL DATA:  Trauma, fall EXAM: LEFT ANKLE COMPLETE - 3+ VIEW COMPARISON:  None Available. FINDINGS: No fracture or dislocation is seen in left ankle. At the edge of the field-of-view, fracture is noted in the distal shaft of second metatarsal. This finding will be better evaluated in the images of the left foot. There is soft tissue swelling over the dorsal aspect. Bony spurs are noted in the dorsal aspect of intertarsal and tarsometatarsal joints. IMPRESSION: Fracture is seen in the distal shaft of second metatarsal. No fracture or dislocation is seen in left ankle. Electronically Signed   By: Ernie Avena M.D.   On: 12/18/2022 08:23    A/P: [name] is a very pleasant 89 year old gentleman who fell at his nursing facility and presents with an obvious deformity of his left foot.  Imaging at this time demonstrated a multiple foot fracture that was reduced and placed into a cam boot by the ER physician.  As a result of the injury orthopedic consultation was requested.  Patient is now admitted for his medical issues.  He has a buddy tape toe and his cam boot is not in place.  He is neurovascularly intact and has no signs or symptoms of a compartment syndrome.  I will discuss the case with my partner Dr. Odis Hollingshead.  He is our foot and ankle specialist.  Further recommendations pending.  Continue using the cam boot.

## 2022-12-18 NOTE — Progress Notes (Addendum)
Patient is alert and oriented X4, admission vitals take, patient bladder scanned in ED with 350CC of urine and patient straight cath per MD order, came out. Patient came to 1603 with 8/10 Left great toe pain and MD Mir chated for IV pain medication.   1320:Patient brought medications from home & son Renae Fickle to pick them up and take them home with him. Patient educated on medication regimen and anything we prescribe to him will be administered from hospital's pyxis. Patient understands and will send medications home with son.   1500: Pharmacy called to verify medications, pharmacy awaiting for facility to fax medications. Pending... 1800: Pharmacy still has not verified meds.

## 2022-12-18 NOTE — ED Triage Notes (Signed)
Pt arrives with [facility] from [facility] for mechanical fall around 0630. Deformity to left foot esp toes, bruising and swelling notes. No LOC, no thinners. Reports left side pain. Hx cancer, on hospice. Hospice nurse gave 15mg  morphine PTA.  144/72 HR 71 95% RA

## 2022-12-19 DIAGNOSIS — E871 Hypo-osmolality and hyponatremia: Secondary | ICD-10-CM | POA: Diagnosis not present

## 2022-12-19 LAB — GLUCOSE, CAPILLARY
Glucose-Capillary: 124 mg/dL — ABNORMAL HIGH (ref 70–99)
Glucose-Capillary: 119 mg/dL — ABNORMAL HIGH (ref 70–99)
Glucose-Capillary: 183 mg/dL — ABNORMAL HIGH (ref 70–99)
Glucose-Capillary: 149 mg/dL — ABNORMAL HIGH (ref 70–99)
Glucose-Capillary: 173 mg/dL — ABNORMAL HIGH (ref 70–99)
Glucose-Capillary: 146 mg/dL — ABNORMAL HIGH (ref 70–99)

## 2022-12-19 LAB — CBC
HCT: 26 % — ABNORMAL LOW (ref 39.0–52.0)
Hemoglobin: 8.3 g/dL — ABNORMAL LOW (ref 13.0–17.0)
MCH: 26.9 pg (ref 26.0–34.0)
MCHC: 31.9 g/dL (ref 30.0–36.0)
MCV: 84.1 fL (ref 80.0–100.0)
Platelets: 251 10*3/uL (ref 150–400)
RBC: 3.09 MIL/uL — ABNORMAL LOW (ref 4.22–5.81)
RDW: 14.5 % (ref 11.5–15.5)
WBC: 4.6 10*3/uL (ref 4.0–10.5)
nRBC: 0 % (ref 0.0–0.2)

## 2022-12-19 LAB — BASIC METABOLIC PANEL
Anion gap: 7 (ref 5–15)
Anion gap: 7 (ref 5–15)
BUN: 34 mg/dL — ABNORMAL HIGH (ref 8–23)
BUN: 31 mg/dL — ABNORMAL HIGH (ref 8–23)
CO2: 30 mmol/L (ref 22–32)
CO2: 31 mmol/L (ref 22–32)
Calcium: 7.9 mg/dL — ABNORMAL LOW (ref 8.9–10.3)
Calcium: 8 mg/dL — ABNORMAL LOW (ref 8.9–10.3)
Chloride: 84 mmol/L — ABNORMAL LOW (ref 98–111)
Chloride: 84 mmol/L — ABNORMAL LOW (ref 98–111)
Creatinine, Ser: 1.71 mg/dL — ABNORMAL HIGH (ref 0.61–1.24)
Creatinine, Ser: 1.65 mg/dL — ABNORMAL HIGH (ref 0.61–1.24)
GFR, Estimated: 38 mL/min — ABNORMAL LOW (ref >60)
GFR, Estimated: 40 mL/min — ABNORMAL LOW (ref >60)
Glucose, Bld: 135 mg/dL — ABNORMAL HIGH (ref 70–99)
Glucose, Bld: 127 mg/dL — ABNORMAL HIGH (ref 70–99)
Potassium: 3.6 mmol/L (ref 3.5–5.1)
Potassium: 3.4 mmol/L — ABNORMAL LOW (ref 3.5–5.1)
Sodium: 121 mmol/L — ABNORMAL LOW (ref 135–145)
Sodium: 122 mmol/L — ABNORMAL LOW (ref 135–145)

## 2022-12-19 LAB — SODIUM
Sodium: 122 mmol/L — ABNORMAL LOW (ref 135–145)
Sodium: 125 mmol/L — ABNORMAL LOW (ref 135–145)
Sodium: 124 mmol/L — ABNORMAL LOW (ref 135–145)

## 2022-12-19 LAB — OSMOLALITY: Osmolality: 273 mOsm/kg — ABNORMAL LOW (ref 275–295)

## 2022-12-19 LAB — TSH: TSH: 1.608 u[IU]/mL (ref 0.350–4.500)

## 2022-12-19 MED ORDER — HYDRALAZINE HCL 20 MG/ML IJ SOLN: 10.0000 mg | INTRAMUSCULAR | Status: DC | PRN

## 2022-12-19 MED ORDER — GUAIFENESIN 100 MG/5ML PO LIQD
5.0000 mL | ORAL | Status: DC | PRN
  Administered 2022-12-27: 5 mL via ORAL
  Filled 2022-12-19: qty 10

## 2022-12-19 MED ORDER — SENNOSIDES-DOCUSATE SODIUM 8.6-50 MG PO TABS
1.0000 | ORAL_TABLET | Freq: Every evening | ORAL | Status: DC | PRN
  Administered 2022-12-19: 1 via ORAL

## 2022-12-19 MED ORDER — SODIUM CHLORIDE 1 G PO TABS
1.0000 g | ORAL_TABLET | Freq: Three times a day (TID) | ORAL | Status: AC
  Administered 2022-12-19 – 2022-12-21 (×9): 1 g via ORAL
  Filled 2022-12-19 (×7): qty 1

## 2022-12-19 MED ORDER — POTASSIUM CHLORIDE CRYS ER 20 MEQ PO TBCR
40.0000 meq | EXTENDED_RELEASE_TABLET | Freq: Once | ORAL | Status: AC
  Administered 2022-12-19: 40 meq via ORAL
  Filled 2022-12-19: qty 2

## 2022-12-19 MED ORDER — SALINE SPRAY 0.65 % NA SOLN: 1.0000 | NASAL | Status: DC | PRN

## 2022-12-19 MED ORDER — PHENOL 1.4 % MT LIQD
1.0000 | OROMUCOSAL | Status: DC | PRN
  Administered 2022-12-24 – 2022-12-28 (×2): 1 via OROMUCOSAL
  Filled 2022-12-19: qty 177

## 2022-12-19 MED ORDER — IPRATROPIUM-ALBUTEROL 0.5-2.5 (3) MG/3ML IN SOLN: 3.0000 mL | RESPIRATORY_TRACT | Status: DC | PRN

## 2022-12-19 MED ORDER — METOPROLOL TARTRATE 5 MG/5ML IV SOLN: 5.0000 mg | INTRAVENOUS | Status: DC | PRN

## 2022-12-19 MED ORDER — LIP MEDEX EX OINT
1.0000 | TOPICAL_OINTMENT | CUTANEOUS | Status: DC | PRN
  Filled 2022-12-19: qty 7

## 2022-12-19 MED ORDER — HYDROCORTISONE (PERIANAL) 2.5 % EX CREA: 1.0000 | TOPICAL_CREAM | Freq: Four times a day (QID) | CUTANEOUS | Status: DC | PRN

## 2022-12-19 MED ORDER — ALUM & MAG HYDROXIDE-SIMETH 200-200-20 MG/5ML PO SUSP
30.0000 mL | ORAL | Status: DC | PRN
  Administered 2022-12-26: 30 mL via ORAL
  Filled 2022-12-19: qty 30

## 2022-12-19 MED ORDER — LORATADINE 10 MG PO TABS
10.0000 mg | ORAL_TABLET | Freq: Every day | ORAL | Status: DC | PRN
  Administered 2022-12-26: 10 mg via ORAL

## 2022-12-19 MED ORDER — HYDROCORTISONE 1 % EX CREA: 1.0000 | TOPICAL_CREAM | Freq: Three times a day (TID) | CUTANEOUS | Status: DC | PRN

## 2022-12-19 MED ORDER — POLYVINYL ALCOHOL 1.4 % OP SOLN: 1.0000 [drp] | OPHTHALMIC | Status: DC | PRN

## 2022-12-19 MED ORDER — MUSCLE RUB 10-15 % EX CREA: 1.0000 | TOPICAL_CREAM | CUTANEOUS | Status: DC | PRN

## 2022-12-19 MED ORDER — TRAZODONE HCL 50 MG PO TABS: 50.0000 mg | ORAL_TABLET | Freq: Every evening | ORAL | Status: DC | PRN

## 2022-12-19 NOTE — Progress Notes (Signed)
PROGRESS NOTE    [name]  XBM:[id number] DOB: 06-Sep-1932 DOA: 12/18/2022 PCP: [name], NP   Brief Narrative:  89 year old with history of recent diagnosis of adenocarcinoma of stomach planning to start Keytruda, BPH, HTN, GERD, HLD admitted to the hospital after a fall at her nursing facility resulting in a left foot fracture also found to have acute kidney injury and hyponatremia.  In the ER sodium was 119, creatinine 2.4   Assessment & Plan:  Principal Problem:   Hyponatremia     Hyponatremia -Suspect this is hypovolemic hyponatremia likely exacerbated by poor oral intake and Lasix. - Urine sodium less than 10, low serum osmolality - Continue normal saline, check sodium every 6 hours. Add Salt tabs   Acute renal failure -Admission creatinine 2.05.  Baseline 1.0.  Slowly improving with IV fluids - Holding Cozaar and Lasix.  Continue IV fluids   Peripheral neuropathy -Gabapentin reduced due to AKI   Left foot fracture -Occurred after mechanical fall at the assisted living facility.  Seen by [facility].  Currently advising to continue cam boot and further recommendations to follow. -Pain control, bowel regimen   Gastric adenocarcinoma History of nonbleeding gastric ulcer -this was recently diagnosed April 2024 after EGD.  After extensive discussion with Dr. Mosetta Putt 11/28/2022, patient declined all treatments including immunotherapy and opted for hospice.  Continue PPI   Chronic anemia -likely due to known gastric adenocarcinoma, hemoglobin appears stable -On iron supplements   Depression -continue home Wellbutrin   DM2 -Currently glipizide on hold.  On sliding scale and Accu-Cheks   DVT prophylaxis: SCDs Start: 12/18/22 1026 Code Status: DNR Family Communication:   Status is: Inpatient Still has significant dehydration requiring IV fluids and has hyponatremia.  Continue hospital stay       Diet Orders (From admission, onward)     Start      Ordered   12/18/22 1343  Diet regular Fluid consistency: Thin  Diet effective now       Question:  Fluid consistency:  Answer:  Thin   12/18/22 1342            Subjective: Feels a little better after getting hydration.   Examination:  General exam: Appears calm and comfortable  Respiratory system: Clear to auscultation. Respiratory effort normal. Cardiovascular system: S1 & S2 heard, RRR. No JVD, murmurs, rubs, gallops or clicks. No pedal edema. Gastrointestinal system: Abdomen is nondistended, soft and nontender. No organomegaly or masses felt. Normal bowel sounds heard. Central nervous system: Alert and oriented. No focal neurological deficits. Extremities: Symmetric 5 x 5 power. Skin: Left foot deformity Psychiatry: Judgement and insight appear normal. Mood & affect appropriate.  Objective: Vitals:   12/18/22 1821 12/18/22 2201 12/19/22 0136 12/19/22 0530  BP: (!) 96/41 (!) 146/56 (!) 130/53 (!) 135/57  Pulse: 62 67 61 66  Resp: 18 18 16 16   Temp: 97.8 F (36.6 C) 98.3 F (36.8 C) 98.1 F (36.7 C) 97.7 F (36.5 C)  TempSrc: Oral     SpO2: 97% (!) 89% 93% 92%  Weight:      Height:        Intake/Output Summary (Last 24 hours) at 12/19/2022 0744 Last data filed at 12/19/2022 0630 Gross per 24 hour  Intake --  Output 1070 ml  Net -1070 ml   Filed Weights   12/18/22 1348  Weight: 79.1 kg    Scheduled Meds:  bupivacaine (PF)  10 mL Infiltration Once   buPROPion  300 mg Oral Daily  ferrous sulfate  325 mg Oral Q breakfast   gabapentin  400 mg Oral BID   insulin aspart  0-15 Units Subcutaneous TID WC   insulin aspart  0-5 Units Subcutaneous QHS   loratadine  10 mg Oral Daily   LORazepam  0.5-1 mg Oral QHS   pantoprazole  40 mg Oral BID   polyethylene glycol  17 g Oral Daily   primidone  50 mg Oral BID   senna-docusate  1 tablet Oral BID   tamsulosin  0.8 mg Oral QPC supper   Continuous Infusions:  sodium chloride 100 mL/hr at 12/18/22 1156     Nutritional status     Body mass index is 29.02 kg/m.  Data Reviewed:   CBC: Recent Labs  Lab 12/18/22 0842 12/18/22 0859 12/19/22 0514  WBC  --  7.7 4.6  NEUTROABS  --  5.7  --   HGB 11.6* 9.4* 8.3*  HCT 34.0* 29.2* 26.0*  MCV  --  83.0 84.1  PLT  --  331 251   Basic Metabolic Panel: Recent Labs  Lab 12/18/22 0859 12/18/22 1307 12/18/22 1613 12/18/22 2322 12/19/22 0514  NA 121* 123* 123* 121* 122*  K 3.5 3.3* 3.2* 3.6 3.4*  CL 79* 80* 83* 84* 84*  CO2 31 28 31 30 31   GLUCOSE 149* 141* 98 135* 127*  BUN 39* 37* 36* 34* 31*  CREATININE 2.31* 2.05* 2.06* 1.71* 1.65*  CALCIUM 8.8* 9.0 8.5* 7.9* 8.0*   GFR: Estimated Creatinine Clearance: 30 mL/min (A) (by C-G formula based on SCr of 1.65 mg/dL (H)). Liver Function Tests: Recent Labs  Lab 12/18/22 0859  AST 35  ALT 49*  ALKPHOS 220*  BILITOT 0.5  PROT 6.2*  ALBUMIN 3.1*   No results for input(s): "LIPASE", "AMYLASE" in the last 168 hours. No results for input(s): "AMMONIA" in the last 168 hours. Coagulation Profile: No results for input(s): "INR", "PROTIME" in the last 168 hours. Cardiac Enzymes: No results for input(s): "CKTOTAL", "CKMB", "CKMBINDEX", "TROPONINI" in the last 168 hours. BNP (last 3 results) No results for input(s): "PROBNP" in the last 8760 hours. HbA1C: No results for input(s): "HGBA1C" in the last 72 hours. CBG: Recent Labs  Lab 12/18/22 1151 12/18/22 1640 12/18/22 2116 12/19/22 0420  GLUCAP 144* 112* 157* 124*   Lipid Profile: No results for input(s): "CHOL", "HDL", "LDLCALC", "TRIG", "CHOLHDL", "LDLDIRECT" in the last 72 hours. Thyroid Function Tests: No results for input(s): "TSH", "T4TOTAL", "FREET4", "T3FREE", "THYROIDAB" in the last 72 hours. Anemia Panel: No results for input(s): "VITAMINB12", "FOLATE", "FERRITIN", "TIBC", "IRON", "RETICCTPCT" in the last 72 hours. Sepsis Labs: No results for input(s): "PROCALCITON", "LATICACIDVEN" in the last 168 hours.  No  results found for this or any previous visit (from the past 240 hour(s)).       Radiology Studies: DG Foot 2 Views Left  Result Date: 12/18/2022 CLINICAL DATA:  Postreduction of the great toe EXAM: LEFT FOOT - 2 VIEW COMPARISON:  Foot radiograph dated June 20, 2020z FINDINGS: Postreduction films demonstrate improved anatomic alignment of the left first MTP joint. Redemonstrated multiple fractures involving the metatarsals and phalanges. Redemonstrated displaced fracture of the distal second metatarsal. Probable additional minimally displaced fracture of the distal first metatarsal which was nodule visualized on prior. Previously described distal third metatarsal fracture not well seen. Probable small fractures of the base of the second and third proximal phalanges. Redemonstrated probable lateral calcaneus fracture. Moderate degenerative changes of the midfoot. Soft tissue edema of the foot. IMPRESSION: Postreduction  films demonstrate improved anatomic alignment of the left first MTP joint. Electronically Signed   By: Allegra Lai M.D.   On: 12/18/2022 11:49   CT Head Wo Contrast  Result Date: 12/18/2022 CLINICAL DATA:  Fall EXAM: CT HEAD WITHOUT CONTRAST TECHNIQUE: Contiguous axial images were obtained from the base of the skull through the vertex without intravenous contrast. RADIATION DOSE REDUCTION: This exam was performed according to the departmental dose-optimization program which includes automated exposure control, adjustment of the mA and/or kV according to patient size and/or use of iterative reconstruction technique. COMPARISON:  CT head 11/13/2022 FINDINGS: Brain: There is no acute intracranial hemorrhage, extra-axial fluid collection, or acute infarct. Parenchymal volume loss with prominence of the ventricular system and extra-axial CSF spaces is unchanged. There is a hypodense left total convexity subdural collection measuring up to 4 mm in thickness without mass effect on the  underlying brain parenchyma or midline shift, new since the prior study. No acute blood is seen within this collection. The pituitary and suprasellar region are normal. There is no mass lesion. There is no mass effect or midline shift. Vascular: There is calcification of the bilateral carotid siphons. Skull: Normal. Negative for fracture or focal lesion. Sinuses/Orbits: The imaged paranasal sinuses are clear. The imaged globes and orbits are unremarkable. Other: None. IMPRESSION: 1. No acute intracranial pathology. 2. Small left cerebral convexity subdural hygroma is new since the study from 11/23/2022. No mass effect on the brain parenchyma, midline shift, or acute blood. Electronically Signed   By: Lesia Hausen M.D.   On: 12/18/2022 09:24   DG Foot Complete Left  Result Date: 12/18/2022 CLINICAL DATA:  Trauma, fall EXAM: LEFT FOOT - COMPLETE 3+ VIEW COMPARISON:  None Available. FINDINGS: There is dislocation in first metatarsophalangeal joint. There is possible tiny avulsion adjacent to the medial aspect of head of the first metatarsal. There is displaced fracture in the distal shaft of second metatarsal with 3 mm offset in alignment of fracture fragments. There is undisplaced fracture in the distal shaft of third metatarsal. There is small linear calcific density adjacent to the lateral aspect of anterior calcaneus suggesting recent or old avulsion. There is faint lucency in the base of proximal phalanx of the second toe. Degenerative changes are noted in intertarsal joints between the navicular and medial cuneiform. There is marked soft tissue swelling over the dorsum. IMPRESSION: Fracture dislocation is seen in the left first metatarsophalangeal joint. There is displaced fracture in the distal shaft of second metatarsal. Undisplaced fracture is seen in the distal shaft of third metatarsal. There is tiny avulsion fracture in the lateral aspect of anterior calcaneus which may be recent or old. There is faint  lucency in the base of proximal phalanx of second toe which may be an artifact or suggest a nondisplaced fracture. Electronically Signed   By: Ernie Avena M.D.   On: 12/18/2022 08:33   DG Ribs Unilateral W/Chest Left  Result Date: 12/18/2022 CLINICAL DATA:  Trauma, fall EXAM: LEFT RIBS AND CHEST - 3+ VIEW COMPARISON:  Chest radiograph done on 11/13/2022 FINDINGS: No displaced fractures are seen in left ribs. AP supine view of the chest is technically less than optimal to evaluate the lung fields. As far as seen, there is no evidence of large pleural effusion or pneumothorax. IMPRESSION: No displaced fracture is seen in left ribs. Less than optimal evaluation of lung fields in the chest radiograph. As far as seen, there are no focal infiltrates or pleural effusion or pneumothorax. Electronically  Signed   By: Ernie Avena M.D.   On: 12/18/2022 08:28   DG Ankle Complete Left  Result Date: 12/18/2022 CLINICAL DATA:  Trauma, fall EXAM: LEFT ANKLE COMPLETE - 3+ VIEW COMPARISON:  None Available. FINDINGS: No fracture or dislocation is seen in left ankle. At the edge of the field-of-view, fracture is noted in the distal shaft of second metatarsal. This finding will be better evaluated in the images of the left foot. There is soft tissue swelling over the dorsal aspect. Bony spurs are noted in the dorsal aspect of intertarsal and tarsometatarsal joints. IMPRESSION: Fracture is seen in the distal shaft of second metatarsal. No fracture or dislocation is seen in left ankle. Electronically Signed   By: Ernie Avena M.D.   On: 12/18/2022 08:23           LOS: 1 day   Time spent= 35 mins    Sterlin Knightly Joline Maxcy, MD [facility]  If 7PM-7AM, please contact night-coverage  12/19/2022, 7:44 AM

## 2022-12-19 NOTE — Progress Notes (Signed)
Mobility Specialist - Progress Note   12/19/22 1401  Mobility  Activity Transferred from chair to bed  Level of Assistance Contact guard assist, steadying assist  Assistive Device None  Distance Ambulated (ft) 5 ft  LLE Weight Bearing WBAT  Activity Response Tolerated fair  Mobility Referral Yes  $Mobility charge 1 Mobility  Mobility Specialist Start Time (ACUTE ONLY) 0146  Mobility Specialist Stop Time (ACUTE ONLY) 0200  Mobility Specialist Time Calculation (min) (ACUTE ONLY) 14 min   Pt received on bench requesting assistance back to bed after soiling the bed. Nurse made aware & assisted in cleaning pt back up. Pt was contact during transfer back to bed due to pain in foot. No other complaints during session. Pt to bed after session with all needs met & call bell in reach.   [facility]

## 2022-12-19 NOTE — Progress Notes (Signed)
    Subjective:    Patient reports pain as 2 on 0-10 scale.   Denies CP or SOB.  Voiding without difficulty. Positive flatus. Objective: Vital signs in last 24 hours: Temp:  [97.7 F (36.5 C)-98.3 F (36.8 C)] 98 F (36.7 C) (06/21 1513) Pulse Rate:  [61-69] 69 (06/21 1513) Resp:  [16-18] 18 (06/21 1513) BP: (96-146)/(41-57) 123/55 (06/21 1513) SpO2:  [89 %-99 %] 99 % (06/21 1513)  Intake/Output from previous day: 06/20 0701 - 06/21 0700 In: -  Out: 1070 [Urine:1070] Intake/Output this shift: Total I/O In: -  Out: 225 [Urine:225]  Labs: Recent Labs    12/18/22 0842 12/18/22 0859 12/19/22 0514  HGB 11.6* 9.4* 8.3*   Recent Labs    12/18/22 0859 12/19/22 0514  WBC 7.7 4.6  RBC 3.52* 3.09*  HCT 29.2* 26.0*  PLT 331 251   Recent Labs    12/18/22 2322 12/19/22 0514 12/19/22 0858 12/19/22 1500  NA 121* 122* 122* 125*  K 3.6 3.4*  --   --   CL 84* 84*  --   --   CO2 30 31  --   --   BUN 34* 31*  --   --   CREATININE 1.71* 1.65*  --   --   GLUCOSE 135* 127*  --   --   CALCIUM 7.9* 8.0*  --   --    No results for input(s): "LABPT", "INR" in the last 72 hours.  Physical Exam: Neurologically intact Intact pulses distally Compartment soft Body mass index is 29.02 kg/m.   Assessment/Plan: Patient denies any significant foot pain.  Currently in cam boot and is comfortable.  Patient can weight-bear as tolerated with the cam boot on.  Recommend he follow-up with my partner Dr. Odis Hollingshead approximately 1 week after discharge.  At this point no surgical intervention is required.  I will sign off if there is any issues please not hesitate to contact me.  Alvy Beal for Dr. Venita Lick [facility] [phone] 12/19/2022, 5:25 PM

## 2022-12-20 DIAGNOSIS — E871 Hypo-osmolality and hyponatremia: Secondary | ICD-10-CM | POA: Diagnosis not present

## 2022-12-20 LAB — GLUCOSE, CAPILLARY
Glucose-Capillary: 143 mg/dL — ABNORMAL HIGH (ref 70–99)
Glucose-Capillary: 76 mg/dL (ref 70–99)
Glucose-Capillary: 162 mg/dL — ABNORMAL HIGH (ref 70–99)
Glucose-Capillary: 167 mg/dL — ABNORMAL HIGH (ref 70–99)

## 2022-12-20 LAB — BASIC METABOLIC PANEL
Anion gap: 7 (ref 5–15)
BUN: 23 mg/dL (ref 8–23)
CO2: 26 mmol/L (ref 22–32)
Calcium: 8 mg/dL — ABNORMAL LOW (ref 8.9–10.3)
Chloride: 91 mmol/L — ABNORMAL LOW (ref 98–111)
Creatinine, Ser: 1.11 mg/dL (ref 0.61–1.24)
GFR, Estimated: >60 mL/min (ref >60)
Glucose, Bld: 164 mg/dL — ABNORMAL HIGH (ref 70–99)
Potassium: 3.8 mmol/L (ref 3.5–5.1)
Sodium: 124 mmol/L — ABNORMAL LOW (ref 135–145)

## 2022-12-20 LAB — CBC
HCT: 25.4 % — ABNORMAL LOW (ref 39.0–52.0)
Hemoglobin: 8.1 g/dL — ABNORMAL LOW (ref 13.0–17.0)
MCH: 26.9 pg (ref 26.0–34.0)
MCHC: 31.9 g/dL (ref 30.0–36.0)
MCV: 84.4 fL (ref 80.0–100.0)
Platelets: 250 10*3/uL (ref 150–400)
RBC: 3.01 MIL/uL — ABNORMAL LOW (ref 4.22–5.81)
RDW: 14.6 % (ref 11.5–15.5)
WBC: 4.6 10*3/uL (ref 4.0–10.5)
nRBC: 0 % (ref 0.0–0.2)

## 2022-12-20 LAB — SODIUM
Sodium: 127 mmol/L — ABNORMAL LOW (ref 135–145)
Sodium: 129 mmol/L — ABNORMAL LOW (ref 135–145)
Sodium: 126 mmol/L — ABNORMAL LOW (ref 135–145)

## 2022-12-20 LAB — MAGNESIUM: Magnesium: 1.9 mg/dL (ref 1.7–2.4)

## 2022-12-20 NOTE — Progress Notes (Signed)
PROGRESS NOTE    [name]  VWU:[id number] DOB: 11/29/1932 DOA: 12/18/2022 PCP: [name], NP   Brief Narrative:  89 year old with history of recent diagnosis of adenocarcinoma of stomach planning to start Keytruda, BPH, HTN, GERD, HLD admitted to the hospital after a fall at her nursing facility resulting in a left foot fracture also found to have acute kidney injury and hyponatremia.  In the ER sodium was 119, creatinine 2.4   Assessment & Plan:  Principal Problem:   Hyponatremia     Hyponatremia -Suspect this is hypovolemic hyponatremia likely exacerbated by poor oral intake and Lasix. - Urine sodium less than 10, low serum osmolality.  Check sodium periodically - Continue normal saline, salt tablets.   Acute renal failure, resolved -Admission creatinine 2.05.  Baseline 1.0.  Slowly improving with IV fluids - Holding Cozaar and Lasix.  Continue IV fluids   Peripheral neuropathy -Gabapentin reduced due to AKI   Left foot fracture -Occurred after mechanical fall at the assisted living facility.  Seen by Raechel Chute currently recommending weightbearing as tolerated with Cam boot and follow-up outpatient in 1 week with Dr. Odis Hollingshead post discharge -Pain control, bowel regimen   Gastric adenocarcinoma History of nonbleeding gastric ulcer -this was recently diagnosed April 2024 after EGD.  After extensive discussion with Dr. Mosetta Putt 11/28/2022, patient declined all treatments including immunotherapy and opted for hospice.  Continue PPI   Chronic anemia -likely due to known gastric adenocarcinoma, hemoglobin appears stable -On iron supplements   Depression -continue home Wellbutrin   DM2, acceptable range -Currently glipizide on hold.  On sliding scale and Accu-Cheks  PT/OT= pending   DVT prophylaxis: SCDs Start: 12/18/22 1026 Code Status: DNR Family Communication:   Status is: Inpatient Current continue hospital stay for management of hyponatremia.   Pending PT/OT evaluation      Diet Orders (From admission, onward)     Start     Ordered   12/18/22 1343  Diet regular Fluid consistency: Thin  Diet effective now       Question:  Fluid consistency:  Answer:  Thin   12/18/22 1342            Subjective: Seen at bedside, no complaints  Examination: Constitutional: Not in acute distress Respiratory: Clear to auscultation bilaterally Cardiovascular: Normal sinus rhythm, no rubs Abdomen: Nontender nondistended good bowel sounds Musculoskeletal: No edema noted Skin: Right foot deformity noted Neurologic: CN 2-12 grossly intact.  And nonfocal Psychiatric: Normal judgment and insight. Alert and oriented x 3. Normal mood.    Objective: Vitals:   12/19/22 0530 12/19/22 1513 12/19/22 2101 12/20/22 0530  BP: (!) 135/57 (!) 123/55 (!) 132/47 (!) 130/56  Pulse: 66 69 60 79  Resp: 16 18 18 16   Temp: 97.7 F (36.5 C) 98 F (36.7 C) 98.4 F (36.9 C) 98.2 F (36.8 C)  TempSrc:  Oral Oral Oral  SpO2: 92% 99% 97% 97%  Weight:      Height:        Intake/Output Summary (Last 24 hours) at 12/20/2022 0726 Last data filed at 12/19/2022 0810 Gross per 24 hour  Intake --  Output 225 ml  Net -225 ml   Filed Weights   12/18/22 1348  Weight: 79.1 kg    Scheduled Meds:  bupivacaine (PF)  10 mL Infiltration Once   buPROPion  300 mg Oral Daily   ferrous sulfate  325 mg Oral Q breakfast   gabapentin  400 mg Oral BID   insulin aspart  0-15  Units Subcutaneous TID WC   insulin aspart  0-5 Units Subcutaneous QHS   loratadine  10 mg Oral Daily   LORazepam  0.5-1 mg Oral QHS   pantoprazole  40 mg Oral BID   polyethylene glycol  17 g Oral Daily   primidone  50 mg Oral BID   senna-docusate  1 tablet Oral BID   sodium chloride  1 g Oral TID WC   tamsulosin  0.8 mg Oral QPC supper   Continuous Infusions:  sodium chloride 100 mL/hr at 12/19/22 1421    Nutritional status     Body mass index is 29.02 kg/m.  Data Reviewed:    CBC: Recent Labs  Lab 12/18/22 0842 12/18/22 0859 12/19/22 0514 12/20/22 0509  WBC  --  7.7 4.6 4.6  NEUTROABS  --  5.7  --   --   HGB 11.6* 9.4* 8.3* 8.1*  HCT 34.0* 29.2* 26.0* 25.4*  MCV  --  83.0 84.1 84.4  PLT  --  331 251 250   Basic Metabolic Panel: Recent Labs  Lab 12/18/22 1307 12/18/22 1613 12/18/22 2322 12/19/22 0514 12/19/22 0858 12/19/22 1500 12/19/22 1953 12/20/22 0509  NA 123* 123* 121* 122* 122* 125* 124* 124*  K 3.3* 3.2* 3.6 3.4*  --   --   --  3.8  CL 80* 83* 84* 84*  --   --   --  91*  CO2 28 31 30 31   --   --   --  26  GLUCOSE 141* 98 135* 127*  --   --   --  164*  BUN 37* 36* 34* 31*  --   --   --  23  CREATININE 2.05* 2.06* 1.71* 1.65*  --   --   --  1.11  CALCIUM 9.0 8.5* 7.9* 8.0*  --   --   --  8.0*  MG  --   --   --   --   --   --   --  1.9   GFR: Estimated Creatinine Clearance: 44.6 mL/min (by C-G formula based on SCr of 1.11 mg/dL). Liver Function Tests: Recent Labs  Lab 12/18/22 0859  AST 35  ALT 49*  ALKPHOS 220*  BILITOT 0.5  PROT 6.2*  ALBUMIN 3.1*   No results for input(s): "LIPASE", "AMYLASE" in the last 168 hours. No results for input(s): "AMMONIA" in the last 168 hours. Coagulation Profile: No results for input(s): "INR", "PROTIME" in the last 168 hours. Cardiac Enzymes: No results for input(s): "CKTOTAL", "CKMB", "CKMBINDEX", "TROPONINI" in the last 168 hours. BNP (last 3 results) No results for input(s): "PROBNP" in the last 8760 hours. HbA1C: No results for input(s): "HGBA1C" in the last 72 hours. CBG: Recent Labs  Lab 12/19/22 0745 12/19/22 1119 12/19/22 1155 12/19/22 1703 12/19/22 2123  GLUCAP 119* 183* 149* 173* 146*   Lipid Profile: No results for input(s): "CHOL", "HDL", "LDLCALC", "TRIG", "CHOLHDL", "LDLDIRECT" in the last 72 hours. Thyroid Function Tests: Recent Labs    12/19/22 0858  TSH 1.608   Anemia Panel: No results for input(s): "VITAMINB12", "FOLATE", "FERRITIN", "TIBC", "IRON",  "RETICCTPCT" in the last 72 hours. Sepsis Labs: No results for input(s): "PROCALCITON", "LATICACIDVEN" in the last 168 hours.  No results found for this or any previous visit (from the past 240 hour(s)).       Radiology Studies: DG Foot 2 Views Left  Result Date: 12/18/2022 CLINICAL DATA:  Postreduction of the great toe EXAM: LEFT FOOT - 2 VIEW COMPARISON:  Foot radiograph dated June 20, 2020z FINDINGS: Postreduction films demonstrate improved anatomic alignment of the left first MTP joint. Redemonstrated multiple fractures involving the metatarsals and phalanges. Redemonstrated displaced fracture of the distal second metatarsal. Probable additional minimally displaced fracture of the distal first metatarsal which was nodule visualized on prior. Previously described distal third metatarsal fracture not well seen. Probable small fractures of the base of the second and third proximal phalanges. Redemonstrated probable lateral calcaneus fracture. Moderate degenerative changes of the midfoot. Soft tissue edema of the foot. IMPRESSION: Postreduction films demonstrate improved anatomic alignment of the left first MTP joint. Electronically Signed   By: Allegra Lai M.D.   On: 12/18/2022 11:49   CT Head Wo Contrast  Result Date: 12/18/2022 CLINICAL DATA:  Fall EXAM: CT HEAD WITHOUT CONTRAST TECHNIQUE: Contiguous axial images were obtained from the base of the skull through the vertex without intravenous contrast. RADIATION DOSE REDUCTION: This exam was performed according to the departmental dose-optimization program which includes automated exposure control, adjustment of the mA and/or kV according to patient size and/or use of iterative reconstruction technique. COMPARISON:  CT head 11/13/2022 FINDINGS: Brain: There is no acute intracranial hemorrhage, extra-axial fluid collection, or acute infarct. Parenchymal volume loss with prominence of the ventricular system and extra-axial CSF spaces is  unchanged. There is a hypodense left total convexity subdural collection measuring up to 4 mm in thickness without mass effect on the underlying brain parenchyma or midline shift, new since the prior study. No acute blood is seen within this collection. The pituitary and suprasellar region are normal. There is no mass lesion. There is no mass effect or midline shift. Vascular: There is calcification of the bilateral carotid siphons. Skull: Normal. Negative for fracture or focal lesion. Sinuses/Orbits: The imaged paranasal sinuses are clear. The imaged globes and orbits are unremarkable. Other: None. IMPRESSION: 1. No acute intracranial pathology. 2. Small left cerebral convexity subdural hygroma is new since the study from 11/23/2022. No mass effect on the brain parenchyma, midline shift, or acute blood. Electronically Signed   By: Lesia Hausen M.D.   On: 12/18/2022 09:24   DG Foot Complete Left  Result Date: 12/18/2022 CLINICAL DATA:  Trauma, fall EXAM: LEFT FOOT - COMPLETE 3+ VIEW COMPARISON:  None Available. FINDINGS: There is dislocation in first metatarsophalangeal joint. There is possible tiny avulsion adjacent to the medial aspect of head of the first metatarsal. There is displaced fracture in the distal shaft of second metatarsal with 3 mm offset in alignment of fracture fragments. There is undisplaced fracture in the distal shaft of third metatarsal. There is small linear calcific density adjacent to the lateral aspect of anterior calcaneus suggesting recent or old avulsion. There is faint lucency in the base of proximal phalanx of the second toe. Degenerative changes are noted in intertarsal joints between the navicular and medial cuneiform. There is marked soft tissue swelling over the dorsum. IMPRESSION: Fracture dislocation is seen in the left first metatarsophalangeal joint. There is displaced fracture in the distal shaft of second metatarsal. Undisplaced fracture is seen in the distal shaft of  third metatarsal. There is tiny avulsion fracture in the lateral aspect of anterior calcaneus which may be recent or old. There is faint lucency in the base of proximal phalanx of second toe which may be an artifact or suggest a nondisplaced fracture. Electronically Signed   By: Ernie Avena M.D.   On: 12/18/2022 08:33   DG Ribs Unilateral W/Chest Left  Result Date: 12/18/2022 CLINICAL DATA:  Trauma, fall EXAM: LEFT RIBS AND CHEST - 3+ VIEW COMPARISON:  Chest radiograph done on 11/13/2022 FINDINGS: No displaced fractures are seen in left ribs. AP supine view of the chest is technically less than optimal to evaluate the lung fields. As far as seen, there is no evidence of large pleural effusion or pneumothorax. IMPRESSION: No displaced fracture is seen in left ribs. Less than optimal evaluation of lung fields in the chest radiograph. As far as seen, there are no focal infiltrates or pleural effusion or pneumothorax. Electronically Signed   By: Ernie Avena M.D.   On: 12/18/2022 08:28   DG Ankle Complete Left  Result Date: 12/18/2022 CLINICAL DATA:  Trauma, fall EXAM: LEFT ANKLE COMPLETE - 3+ VIEW COMPARISON:  None Available. FINDINGS: No fracture or dislocation is seen in left ankle. At the edge of the field-of-view, fracture is noted in the distal shaft of second metatarsal. This finding will be better evaluated in the images of the left foot. There is soft tissue swelling over the dorsal aspect. Bony spurs are noted in the dorsal aspect of intertarsal and tarsometatarsal joints. IMPRESSION: Fracture is seen in the distal shaft of second metatarsal. No fracture or dislocation is seen in left ankle. Electronically Signed   By: Ernie Avena M.D.   On: 12/18/2022 08:23           LOS: 2 days   Time spent= 35 mins    Mistina Coatney Joline Maxcy, MD [facility]  If 7PM-7AM, please contact night-coverage  12/20/2022, 7:26 AM

## 2022-12-21 DIAGNOSIS — E871 Hypo-osmolality and hyponatremia: Secondary | ICD-10-CM | POA: Diagnosis not present

## 2022-12-21 LAB — SODIUM
Sodium: 130 mmol/L — ABNORMAL LOW (ref 135–145)
Sodium: 134 mmol/L — ABNORMAL LOW (ref 135–145)
Sodium: 133 mmol/L — ABNORMAL LOW (ref 135–145)

## 2022-12-21 LAB — CBC
HCT: 24.4 % — ABNORMAL LOW (ref 39.0–52.0)
Hemoglobin: 7.7 g/dL — ABNORMAL LOW (ref 13.0–17.0)
MCH: 27.1 pg (ref 26.0–34.0)
MCHC: 31.6 g/dL (ref 30.0–36.0)
MCV: 85.9 fL (ref 80.0–100.0)
Platelets: 238 10*3/uL (ref 150–400)
RBC: 2.84 MIL/uL — ABNORMAL LOW (ref 4.22–5.81)
RDW: 14.5 % (ref 11.5–15.5)
WBC: 4.3 10*3/uL (ref 4.0–10.5)
nRBC: 0 % (ref 0.0–0.2)

## 2022-12-21 LAB — GLUCOSE, CAPILLARY
Glucose-Capillary: 130 mg/dL — ABNORMAL HIGH (ref 70–99)
Glucose-Capillary: 152 mg/dL — ABNORMAL HIGH (ref 70–99)
Glucose-Capillary: 105 mg/dL — ABNORMAL HIGH (ref 70–99)
Glucose-Capillary: 137 mg/dL — ABNORMAL HIGH (ref 70–99)

## 2022-12-21 LAB — BASIC METABOLIC PANEL
Anion gap: 6 (ref 5–15)
BUN: 14 mg/dL (ref 8–23)
CO2: 26 mmol/L (ref 22–32)
Calcium: 7.5 mg/dL — ABNORMAL LOW (ref 8.9–10.3)
Chloride: 96 mmol/L — ABNORMAL LOW (ref 98–111)
Creatinine, Ser: 0.98 mg/dL (ref 0.61–1.24)
GFR, Estimated: >60 mL/min (ref >60)
Glucose, Bld: 116 mg/dL — ABNORMAL HIGH (ref 70–99)
Potassium: 4 mmol/L (ref 3.5–5.1)
Sodium: 128 mmol/L — ABNORMAL LOW (ref 135–145)

## 2022-12-21 LAB — MAGNESIUM: Magnesium: 1.9 mg/dL (ref 1.7–2.4)

## 2022-12-21 NOTE — Evaluation (Signed)
Physical Therapy Evaluation Patient Details Name: [name] MRN: [id number] DOB: [birth year] Today's Date: 12/21/2022  History of Present Illness  Patient is a 89 year old  male who was admitted to the hospital after a fall at his indepedent living facility resulting in a left foot fracture also found to have acute kidney injury and hyponatremia   PMH:  recent diagnosis of adenocarcinoma of stomach, BPH, HTN, GERD, HLD  Clinical Impression  Pt admitted with above diagnosis.  Eval limited today d/t pt c/o not feeling well, L foot painful; pt cooperative at bed level, repositioned for improved L foot support and provided with ice pack to foot with pt reporting some relief;  Pt will need incr level of care at d/c, he is from ILF at [facility], can return to ALF at [facility] if staff can provide assist vs SNF if staff unable to provide care--will defer further needs to [facility] as this gentleman is also a Hospice pt and cannot have therapy while on hospice.    Pt currently with functional limitations due to the deficits listed below (see PT Problem List). Pt will benefit from acute skilled PT to increase their independence and safety with mobility to allow discharge.          Recommendations for follow up therapy are one component of a multi-disciplinary discharge planning process, led by the attending physician.  Recommendations may be updated based on patient status, additional functional criteria and insurance authorization.  Follow Up Recommendations       Assistance Recommended at Discharge    Patient can return home with the following  A little help with walking and/or transfers;A little help with bathing/dressing/bathroom;Assistance with cooking/housework;Assist for transportation;Help with stairs or ramp for entrance    Equipment Recommendations    Recommendations for Other Services       Functional Status Assessment Patient has had a recent decline in their functional status  and demonstrates the ability to make significant improvements in function in a reasonable and predictable amount of time.     Precautions / Restrictions Precautions Precautions: Fall Required Braces or Orthoses: Other Brace Other Brace: camboot L foot Restrictions Weight Bearing Restrictions: Yes LLE Weight Bearing: Weight bearing as tolerated      Mobility  Bed Mobility   Bed Mobility: Rolling Rolling: Supervision         General bed mobility comments: pt rolls from supine to R side with use of rail and supervision. declines sitting EOB or OOB at this time    Transfers                        Ambulation/Gait                  Stairs            Wheelchair Mobility    Modified Rankin (Stroke Patients Only)       Balance Overall balance assessment: History of Falls                                           Pertinent Vitals/Pain Pain Assessment Pain Assessment: Faces Faces Pain Scale: Hurts little more Pain Location: L foot Pain Intervention(s): Limited activity within patient's tolerance, Monitored during session, Repositioned, Ice applied (pillow placed between knees to give L foot support)    Home Living Family/patient expects to  be discharged to:: Assisted living               Home Layout: One level Home Equipment: Agricultural consultant (2 wheels) Additional Comments: pt was at [facility] (with Hospice) at [facility] prior to admission, tells PT he is going to ALF    Prior Function               Mobility Comments: pt reports amb with RW prior to multiple recent  falls       Hand Dominance        Extremity/Trunk Assessment   Upper Extremity Assessment Upper Extremity Assessment: Defer to OT evaluation    Lower Extremity Assessment Lower Extremity Assessment: LLE deficits/detail;RLE deficits/detail RLE Deficits / Details: grossly WFL LLE Deficits / Details: ankle ROM limited  d/t pain; knee and hip  grossly WFL       Communication   Communication: No difficulties  Cognition Arousal/Alertness: Awake/alert Behavior During Therapy: WFL for tasks assessed/performed Overall Cognitive Status: Within Functional Limits for tasks assessed                                          General Comments      Exercises     Assessment/Plan    PT Assessment Patient needs continued PT services  PT Problem List Decreased strength;Pain;Decreased activity tolerance;Decreased mobility;Decreased knowledge of precautions;Decreased range of motion;Decreased balance;Decreased safety awareness;Decreased knowledge of use of DME       PT Treatment Interventions DME instruction;Therapeutic exercise;Gait training;Functional mobility training;Therapeutic activities;Patient/family education    PT Goals (Current goals can be found in the Care Plan section)  Acute Rehab PT Goals PT Goal Formulation: With patient Time For Goal Achievement: 01/04/23    Frequency Min 1X/week     Co-evaluation               AM-PAC PT "6 Clicks" Mobility  Outcome Measure Help needed turning from your back to your side while in a flat bed without using bedrails?:  (NT/pt declined)                End of Session   Activity Tolerance: Patient limited by fatigue Patient left: in bed;with call bell/phone within reach;with bed alarm set   PT Visit Diagnosis: Other abnormalities of gait and mobility (R26.89)    Time:  -      Charges:              Delice Bison, PT  Acute Rehab Dept ([facility]) [phone]  12/21/2022   [facility] 12/21/2022, 1:33 PM

## 2022-12-21 NOTE — Progress Notes (Signed)
PROGRESS NOTE    [name]  ZOX:[id number] DOB: 1932-12-21 DOA: 12/18/2022 PCP: [name], NP   Brief Narrative:  89 year old with history of recent diagnosis of adenocarcinoma of stomach planning to start Keytruda, BPH, HTN, GERD, HLD admitted to the hospital after a fall at her nursing facility resulting in a left foot fracture also found to have acute kidney injury and hyponatremia.  In the ER sodium was 119, creatinine 2.4.  Creatinine and sodium levels improved with IV fluids.  Cozaar and Lasix initially were held.  PT OT evaluation still pending   Assessment & Plan:  Principal Problem:   Hyponatremia     Gastric adenocarcinoma History of nonbleeding gastric ulcer with Melena -this was recently diagnosed April 2024 after EGD.  After extensive discussion with Dr. Mosetta Putt 11/28/2022, patient declined all treatments including immunotherapy and opted for hospice.  Continue PPI  Hyponatremia -Suspect this is hypovolemic hyponatremia likely exacerbated by poor oral intake and Lasix.  Initially sodium was down to 122 but with IV fluids and has improved 228 over the course of 3 days. - Urine sodium less than 10, low serum osmolality.   - Continue normal saline, salt tablets.   Acute renal failure, resolved -Admission creatinine 2.05.  Baseline 1.0.  Now with with IV fluid.  Holding Cozaar and Lasix.  I think we can resume this in the next 24-48 hours.   Peripheral neuropathy -Gabapentin reduced due to AKI   Left foot fracture -Occurred after mechanical fall at the assisted living facility.  Seen by [facility] currently recommending weightbearing as tolerated with Cam boot and follow-up outpatient in 1 week with Dr. Odis Hollingshead post discharge -Pain control, bowel regimen  Blood loss Chronic anemia -likely due to known gastric adenocarcinoma, hemoglobin slowly trending down. PRBC tx if Hb <7 -On iron supplements   Depression -continue home Wellbutrin   DM2,  acceptable range -Currently glipizide on hold.  On sliding scale and Accu-Cheks  PT/OT= still pending   DVT prophylaxis: SCDs Start: 12/18/22 1026 Code Status: DNR Family Communication: Son updated Status is: Inpatient Current continue hospital stay for management of hyponatremia.  Pending PT/OT evaluation      Diet Orders (From admission, onward)     Start     Ordered   12/18/22 1343  Diet regular Fluid consistency: Thin  Diet effective now       Question:  Fluid consistency:  Answer:  Thin   12/18/22 1342            Subjective: Patient seen and examined at bedside.  He still having diarrhea with some intermittent dark stools.  He does not want any further treatment and therapy.  Wishes to be more comfortable and would like to speak with hospice. I also updated his son who is in agreement  Examination: Constitutional: Not in acute distress Respiratory: Clear to auscultation bilaterally Cardiovascular: Normal sinus rhythm, no rubs Abdomen: Nontender nondistended good bowel sounds Musculoskeletal: Left foot deformity Skin: No rashes seen Neurologic: CN 2-12 grossly intact.  And nonfocal Psychiatric: Normal judgment and insight. Alert and oriented x 3. Normal mood. Objective: Vitals:   12/20/22 0530 12/20/22 1351 12/20/22 2151 12/21/22 0633  BP: (!) 130/56 (!) 134/57 132/63 (!) 138/49  Pulse: 79 73 66 78  Resp: 16  16 16   Temp: 98.2 F (36.8 C) 98.4 F (36.9 C) 99.1 F (37.3 C) 98.4 F (36.9 C)  TempSrc: Oral Oral Oral Oral  SpO2: 97% 92% 93% 91%  Weight:  Height:        Intake/Output Summary (Last 24 hours) at 12/21/2022 0752 Last data filed at 12/21/2022 0630 Gross per 24 hour  Intake 2357.05 ml  Output 900 ml  Net 1457.05 ml   Filed Weights   12/18/22 1348  Weight: 79.1 kg    Scheduled Meds:  bupivacaine (PF)  10 mL Infiltration Once   buPROPion  300 mg Oral Daily   ferrous sulfate  325 mg Oral Q breakfast   gabapentin  400 mg Oral BID    insulin aspart  0-15 Units Subcutaneous TID WC   insulin aspart  0-5 Units Subcutaneous QHS   loratadine  10 mg Oral Daily   LORazepam  0.5-1 mg Oral QHS   pantoprazole  40 mg Oral BID   polyethylene glycol  17 g Oral Daily   primidone  50 mg Oral BID   senna-docusate  1 tablet Oral BID   sodium chloride  1 g Oral TID WC   tamsulosin  0.8 mg Oral QPC supper   Continuous Infusions:  sodium chloride 100 mL/hr at 12/20/22 1158    Nutritional status     Body mass index is 29.02 kg/m.  Data Reviewed:   CBC: Recent Labs  Lab 12/18/22 0842 12/18/22 0859 12/19/22 0514 12/20/22 0509 12/21/22 0224  WBC  --  7.7 4.6 4.6 4.3  NEUTROABS  --  5.7  --   --   --   HGB 11.6* 9.4* 8.3* 8.1* 7.7*  HCT 34.0* 29.2* 26.0* 25.4* 24.4*  MCV  --  83.0 84.1 84.4 85.9  PLT  --  331 251 250 238   Basic Metabolic Panel: Recent Labs  Lab 12/18/22 1613 12/18/22 2322 12/19/22 0514 12/19/22 0858 12/20/22 0509 12/20/22 1102 12/20/22 1435 12/20/22 2103 12/21/22 0224  NA 123* 121* 122*   < > 124* 127* 129* 126* 128*  K 3.2* 3.6 3.4*  --  3.8  --   --   --  4.0  CL 83* 84* 84*  --  91*  --   --   --  96*  CO2 31 30 31   --  26  --   --   --  26  GLUCOSE 98 135* 127*  --  164*  --   --   --  116*  BUN 36* 34* 31*  --  23  --   --   --  14  CREATININE 2.06* 1.71* 1.65*  --  1.11  --   --   --  0.98  CALCIUM 8.5* 7.9* 8.0*  --  8.0*  --   --   --  7.5*  MG  --   --   --   --  1.9  --   --   --  1.9   < > = values in this interval not displayed.   GFR: Estimated Creatinine Clearance: 50.5 mL/min (by C-G formula based on SCr of 0.98 mg/dL). Liver Function Tests: Recent Labs  Lab 12/18/22 0859  AST 35  ALT 49*  ALKPHOS 220*  BILITOT 0.5  PROT 6.2*  ALBUMIN 3.1*   No results for input(s): "LIPASE", "AMYLASE" in the last 168 hours. No results for input(s): "AMMONIA" in the last 168 hours. Coagulation Profile: No results for input(s): "INR", "PROTIME" in the last 168 hours. Cardiac  Enzymes: No results for input(s): "CKTOTAL", "CKMB", "CKMBINDEX", "TROPONINI" in the last 168 hours. BNP (last 3 results) No results for input(s): "PROBNP" in the last 8760 hours.  HbA1C: No results for input(s): "HGBA1C" in the last 72 hours. CBG: Recent Labs  Lab 12/19/22 2123 12/20/22 0725 12/20/22 1212 12/20/22 1713 12/20/22 2157  GLUCAP 146* 143* 76 162* 167*   Lipid Profile: No results for input(s): "CHOL", "HDL", "LDLCALC", "TRIG", "CHOLHDL", "LDLDIRECT" in the last 72 hours. Thyroid Function Tests: Recent Labs    12/19/22 0858  TSH 1.608   Anemia Panel: No results for input(s): "VITAMINB12", "FOLATE", "FERRITIN", "TIBC", "IRON", "RETICCTPCT" in the last 72 hours. Sepsis Labs: No results for input(s): "PROCALCITON", "LATICACIDVEN" in the last 168 hours.  No results found for this or any previous visit (from the past 240 hour(s)).       Radiology Studies: No results found.         LOS: 3 days   Time spent= 35 mins    Juliah Scadden Joline Maxcy, MD [facility]  If 7PM-7AM, please contact night-coverage  12/21/2022, 7:52 AM

## 2022-12-21 NOTE — Evaluation (Signed)
Occupational Therapy Evaluation Patient Details Name: [name] MRN: [id number] DOB: 1932/10/02 Today's Date: 12/21/2022   History of Present Illness Patient is a 89 year old  male who was admitted to the hospital after a fall at his indepedent living facility resulting in a left foot fracture also found to have acute kidney injury and hyponatremia   PMH:  recent diagnosis of adenocarcinoma of stomach, BPH, HTN, GERD, HLD   Clinical Impression   Patient is a 89 year old male who was admitted for above. Patient was living at ILF with no A for ADLs PLOF. Currently, patient is TD for hygiene x2 during session with incontinence of BM with dark color. Nurse made aware. Patient reported plan was to transition back to [facility] at time of d/c. Patient was noted to have decreased functional activity tolerance, decreased endurance, decreased standing balance, decreased safety awareness, and decreased knowledge of AD/AE impacting participation in ADLs. Patient would continue to benefit from skilled OT services at this time while admitted and after d/c to address noted deficits in order to improve overall safety and independence in ADLs.       Recommendations for follow up therapy are one component of a multi-disciplinary discharge planning process, led by the attending physician.  Recommendations may be updated based on patient status, additional functional criteria and insurance authorization.   Assistance Recommended at Discharge Frequent or constant Supervision/Assistance  Patient can return home with the following A lot of help with bathing/dressing/bathroom;Assistance with cooking/housework;Direct supervision/assist for medications management;Assist for transportation;Help with stairs or ramp for entrance;Direct supervision/assist for financial management;A lot of help with walking and/or transfers    Functional Status Assessment  Patient has had a recent decline in their  functional status and demonstrates the ability to make significant improvements in function in a reasonable and predictable amount of time.  Equipment Recommendations  None recommended by OT       Precautions / Restrictions Precautions Precautions: Fall Required Braces or Orthoses: Other Brace Other Brace: camboot L foot Restrictions Weight Bearing Restrictions: Yes LLE Weight Bearing: Weight bearing as tolerated      Mobility Bed Mobility Overal bed mobility: Needs Assistance   Rolling: Supervision         General bed mobility comments: pt rolls from supine to R side with use of rail and supervision          Balance Overall balance assessment: History of Falls, Mild deficits observed, not formally tested           ADL either performed or assessed with clinical judgement   ADL Overall ADL's : Needs assistance/impaired Eating/Feeding: Modified independent;Sitting   Grooming: Wash/dry face;Wash/dry hands;Sitting;Minimal assistance;Brushing hair Grooming Details (indicate cue type and reason): min a to brush hair with comb with education on trying tasks prior to saying you cant able to use AAROM of BUE to comb front part of hair sitting up in recliner. "i cant do anything for myself anymore" Upper Body Bathing: Min guard;Sitting   Lower Body Bathing: Maximal assistance;Sit to/from stand;Sitting/lateral leans   Upper Body Dressing : Minimal assistance;Sitting   Lower Body Dressing: Maximal assistance;Sit to/from stand   Toilet Transfer: Moderate assistance;Rolling walker (2 wheels);Stand-pivot Statistician Details (indicate cue type and reason): to recliner then to [facility] and to recliner again. Toileting- Architect and Hygiene: Sit to/from stand;Total assistance Toileting - Clothing Manipulation Details (indicate cue type and reason): noted to have had incontinence episode prior to getting up from bed with patient not  having alerted nursing to assist wit  hygiene tasks. patient was cleaned up x2 during session with consisent loose BM. nurse made aware.             Vision Baseline Vision/History: 1 Wears glasses              Pertinent Vitals/Pain Pain Assessment Pain Assessment: Faces Faces Pain Scale: Hurts little more Pain Location: L foot Pain Descriptors / Indicators: Discomfort, Grimacing     Hand Dominance Right   Extremity/Trunk Assessment Upper Extremity Assessment Upper Extremity Assessment: Generalized weakness   Lower Extremity Assessment Lower Extremity Assessment: Defer to PT evaluation RLE Deficits / Details: grossly WFL LLE Deficits / Details: ankle ROM limited  d/t pain; knee and hip grossly WFL   Cervical / Trunk Assessment Cervical / Trunk Assessment: Kyphotic   Communication Communication Communication: No difficulties   Cognition Arousal/Alertness: Awake/alert Behavior During Therapy: WFL for tasks assessed/performed Overall Cognitive Status: Within Functional Limits for tasks assessed     General Comments: patient was noted to have a moment or two of learned helplessness and poor insight to deficits with asking for therapist to complete tasks patient could complete himself with reports of desire to transition back to ILF and not need help from anyone.                Home Living Family/patient expects to be discharged to:: Assisted living             Home Layout: One level               Home Equipment: Agricultural consultant (2 wheels)   Additional Comments: pt was at ILF (with Hospice) at [facility] prior to admission, tells OT he is going to ALF      Prior Functioning/Environment               Mobility Comments: pt reports amb with RW prior to multiple recent  falls ADLs Comments: reported that he was able to do everything himself prior to recent weeks getting ill.        OT Problem List: Decreased activity tolerance;Impaired balance (sitting and/or  standing);Decreased coordination;Decreased safety awareness;Decreased knowledge of precautions;Decreased knowledge of use of DME or AE      OT Treatment/Interventions: Self-care/ADL training;Energy conservation;Therapeutic exercise;DME and/or AE instruction;Therapeutic activities;Patient/family education;Balance training    OT Goals(Current goals can be found in the care plan section) Acute Rehab OT Goals Patient Stated Goal: to get back to ILF OT Goal Formulation: With patient Time For Goal Achievement: 01/04/23  OT Frequency: Min 2X/week       AM-PAC OT "6 Clicks" Daily Activity     Outcome Measure Help from another person eating meals?: None Help from another person taking care of personal grooming?: A Little Help from another person toileting, which includes using toliet, bedpan, or urinal?: A Lot Help from another person bathing (including washing, rinsing, drying)?: A Lot Help from another person to put on and taking off regular upper body clothing?: A Little Help from another person to put on and taking off regular lower body clothing?: A Lot 6 Click Score: 16   End of Session Equipment Utilized During Treatment: Gait belt;Rolling walker (2 wheels) Nurse Communication: Other (comment) (concerns over BM incontineince appearance)  Activity Tolerance: Patient tolerated treatment well Patient left: in chair;with call bell/phone within reach;with chair alarm set  OT Visit Diagnosis: Unsteadiness on feet (R26.81);Other abnormalities of gait and mobility (R26.89);Pain  Time: 5784-6962 OT Time Calculation (min): 33 min Charges:  OT General Charges $OT Visit: 1 Visit OT Evaluation $OT Eval Moderate Complexity: 1 Mod OT Treatments $Self Care/Home Management : 8-22 mins  Rosalio Loud, MS Acute Rehabilitation Department Office# [phone]   Selinda Flavin 12/21/2022, 1:46 PM

## 2022-12-22 DIAGNOSIS — E871 Hypo-osmolality and hyponatremia: Secondary | ICD-10-CM | POA: Diagnosis not present

## 2022-12-22 LAB — BASIC METABOLIC PANEL
Anion gap: 9 (ref 5–15)
BUN: 10 mg/dL (ref 8–23)
CO2: 22 mmol/L (ref 22–32)
Calcium: 7.7 mg/dL — ABNORMAL LOW (ref 8.9–10.3)
Chloride: 102 mmol/L (ref 98–111)
Creatinine, Ser: 0.9 mg/dL (ref 0.61–1.24)
GFR, Estimated: >60 mL/min (ref >60)
Glucose, Bld: 107 mg/dL — ABNORMAL HIGH (ref 70–99)
Potassium: 4.2 mmol/L (ref 3.5–5.1)
Sodium: 133 mmol/L — ABNORMAL LOW (ref 135–145)

## 2022-12-22 LAB — TYPE AND SCREEN: Antibody Screen: NEGATIVE

## 2022-12-22 LAB — CBC
HCT: 26.7 % — ABNORMAL LOW (ref 39.0–52.0)
Hemoglobin: 7.7 g/dL — ABNORMAL LOW (ref 13.0–17.0)
MCH: 27 pg (ref 26.0–34.0)
MCHC: 28.8 g/dL — ABNORMAL LOW (ref 30.0–36.0)
MCV: 93.7 fL (ref 80.0–100.0)
Platelets: 260 10*3/uL (ref 150–400)
RBC: 2.85 MIL/uL — ABNORMAL LOW (ref 4.22–5.81)
RDW: 14.7 % (ref 11.5–15.5)
WBC: 4.1 10*3/uL (ref 4.0–10.5)
nRBC: 0 % (ref 0.0–0.2)

## 2022-12-22 LAB — PREPARE RBC (CROSSMATCH)

## 2022-12-22 LAB — BPAM RBC
ISSUE DATE / TIME: 202406241034
Unit Type and Rh: 6200

## 2022-12-22 LAB — GLUCOSE, CAPILLARY
Glucose-Capillary: 111 mg/dL — ABNORMAL HIGH (ref 70–99)
Glucose-Capillary: 130 mg/dL — ABNORMAL HIGH (ref 70–99)
Glucose-Capillary: 114 mg/dL — ABNORMAL HIGH (ref 70–99)

## 2022-12-22 LAB — TROPONIN I (HIGH SENSITIVITY)
Troponin I (High Sensitivity): 39 ng/L — ABNORMAL HIGH (ref <18)
Troponin I (High Sensitivity): 76 ng/L — ABNORMAL HIGH (ref <18)

## 2022-12-22 LAB — MAGNESIUM: Magnesium: 1.8 mg/dL (ref 1.7–2.4)

## 2022-12-22 LAB — SODIUM: Sodium: 132 mmol/L — ABNORMAL LOW (ref 135–145)

## 2022-12-22 MED ORDER — POLYETHYLENE GLYCOL 3350 17 G PO PACK: 17.0000 g | PACK | Freq: Every day | ORAL | 0 refills | Status: DC

## 2022-12-22 MED ORDER — NITROGLYCERIN 0.4 MG SL SUBL
0.4000 mg | SUBLINGUAL_TABLET | SUBLINGUAL | Status: DC | PRN
  Administered 2022-12-22 – 2022-12-26 (×3): 0.4 mg via SUBLINGUAL
  Filled 2022-12-22 (×3): qty 1

## 2022-12-22 MED ORDER — LORAZEPAM 0.5 MG PO TABS: 0.5000 mg | ORAL_TABLET | Freq: Three times a day (TID) | ORAL | 0 refills | Status: DC | PRN

## 2022-12-22 MED ORDER — SODIUM CHLORIDE 0.9% IV SOLUTION: Freq: Once | INTRAVENOUS | Status: AC

## 2022-12-22 MED ORDER — MORPHINE SULFATE 15 MG PO TABS: 15.0000 mg | ORAL_TABLET | Freq: Four times a day (QID) | ORAL | 0 refills | Status: DC | PRN

## 2022-12-22 NOTE — TOC Initial Note (Signed)
Transition of Care [facility]) - Initial/Assessment Note    Patient Details  Name: [name] MRN: [id number] Date of Birth: Mar 06, 1933  [facility] CM/SW Contact:    Amada Jupiter, LCSW Phone Number: 12/22/2022, 1:03 PM  Clinical Narrative:                  Met with pt and spoke with son, Renae Fickle, to review dc planning needs and therapy recommendations.  Both confirm that pt has lived in [place] apt at [facility] for 18 yrs. Pt very much wants to return to apt if at all possible.  Have explained to both that we need to determine how much assistance he now requires with broken foot.  Explained that he may need to consider SNF for rehab prior to return to [place].  It appears that pt is already connected with [facility] but attempting to confirm this as well.  Pt/ son agreeable for me to speak with contacts at [facility] and I have spoken with Henreitta Cea (cell [phone]) who notes that pt has appeared to be declining in overall mobility/ function since hospitalization in May.  Notes that, if pt requires 24/7 assist now because of fx, then it may be beneficial to have short term rehab.  If pt/ family want pt to return directly to [facility] then they would have to privately hire more assistance.  At this point, await further PT/ mobility work here to better determine pt's mobility status and assistance level in order to make most appropriate dc plan.  Expected Discharge Plan: Skilled Nursing Facility (vs. return to [place] @ [name]) Barriers to Discharge: Continued Medical Work up   Patient Goals and CMS Choice Patient states their goals for this hospitalization and ongoing recovery are:: pt's goal is to return to [place] apt          Expected Discharge Plan and Services In-house Referral: Clinical Social Work     Living arrangements for the past 2 months: Independent Press photographer                                      Prior Living  Arrangements/Services Living arrangements for the past 2 months: Marketing executive Lives with:: Self Patient language and need for interpreter reviewed:: Yes Do you feel safe going back to the place where you live?: Yes      Need for Family Participation in Patient Care: No (Comment) Care giver support system in place?: Yes (comment)   Criminal Activity/Legal Involvement Pertinent to Current Situation/Hospitalization: No - Comment as needed  Activities of Daily Living Home Assistive Devices/Equipment: Environmental consultant (specify type) ADL Screening (condition at time of admission) Patient's cognitive ability adequate to safely complete daily activities?: Yes Is the patient deaf or have difficulty hearing?: No Does the patient have difficulty seeing, even when wearing glasses/contacts?: No Does the patient have difficulty concentrating, remembering, or making decisions?: No Patient able to express need for assistance with ADLs?: Yes Does the patient have difficulty dressing or bathing?: No Independently performs ADLs?: No Communication: Independent Dressing (OT): Independent Grooming: Independent Feeding: Independent Bathing: Independent Toileting: Independent In/Out Bed: Independent with device (comment) Walks in Home: Independent with device (comment) Does the patient have difficulty walking or climbing stairs?: Yes Weakness of Legs: Left Weakness of Arms/Hands: Left  Permission Sought/Granted Permission sought to share information with : Family Supports, Oceanographer  granted to share information with : Yes, Verbal Permission Granted  Share Information with NAME: son, [name] @ [phone]  Permission granted to share info w AGENCY: [facility] @ [phone]        Emotional Assessment Appearance:: Appears stated age Attitude/Demeanor/Rapport: Engaged, Gracious Affect (typically observed): Accepting Orientation: :  Oriented to Self, Oriented to Place, Oriented to  Time, Oriented to Situation Alcohol / Substance Use: Not Applicable Psych Involvement: No (comment)  Admission diagnosis:  Hyponatremia [E87.1] Patient Active Problem List   Diagnosis Date Noted   Hyponatremia 12/18/2022   General weakness 11/16/2022   Palliative care by specialist 11/14/2022   Elevated troponin 11/14/2022   Symptomatic anemia 11/14/2022   GIB (gastrointestinal bleeding) 11/13/2022   Chest pain 11/13/2022   ABLA (acute blood loss anemia) 11/13/2022   Goals of care, counseling/discussion 10/24/2022   Gastric cancer (HCC) 10/09/2022   Abnormal CT scan, stomach 09/18/2022   Gastric ulcer without hemorrhage or perforation 09/18/2022   Leg swelling 03/12/2021   Acute cholecystitis 07/29/2019   Sinus bradycardia 11/26/2016   Prostate cancer (HCC) 02/03/2013   ANXIETY 03/07/2010   Essential hypertension, benign 03/07/2010   GERD 03/07/2010   HOARSENESS, CHRONIC 03/07/2010   DIABETES MELLITUS 03/06/2010   HYPERCHOLESTEROLEMIA 03/06/2010   DEPRESSION 03/06/2010   HIATAL HERNIA WITH REFLUX 03/06/2010   DIVERTICULOSIS, COLON 03/06/2010   IBS 03/06/2010   RENAL CALCULUS, RECURRENT 03/06/2010   COUGH 03/06/2010   PCP:  [name], NP Pharmacy:   [facility] - [place], [place] - [address] [name] [place] [address] Phone: [phone] Fax: [phone]     Social Determinants of Health (SDOH) Social History: SDOH Screenings   Food Insecurity: No Food Insecurity (12/18/2022)  Housing: Low Risk  (12/18/2022)  Transportation Needs: No Transportation Needs (12/18/2022)  Utilities: Not At Risk (12/18/2022)  Depression (PHQ2-9): Low Risk  (10/10/2022)  Tobacco Use: Low Risk  (12/18/2022)   SDOH Interventions:     Readmission Risk Interventions    12/22/2022    1:00 PM  Readmission Risk Prevention Plan  Transportation Screening Complete  PCP or Specialist Appt within 3-5 Days  Complete  HRI or Home Care Consult Complete  Social Work Consult for Recovery Care Planning/Counseling Complete  Palliative Care Screening Complete  Medication Review Oceanographer) Complete

## 2022-12-22 NOTE — Care Management Important Message (Signed)
Important Message  Patient Details IM Letter given to the Patient. Name: [name] MRN: [id number] Date of Birth: 22-May-1933   Medicare Important Message Given:  Yes     [name] 12/22/2022, 10:35 AM

## 2022-12-22 NOTE — Progress Notes (Signed)
PROGRESS NOTE    [name]  ZOX:[id number] DOB: Jul 07, 1932 DOA: 12/18/2022 PCP: [name], NP   Brief Narrative:  89 year old with history of recent diagnosis of adenocarcinoma of stomach planning to start Keytruda, BPH, HTN, GERD, HLD admitted to the hospital after a fall at her nursing facility resulting in a left foot fracture also found to have acute kidney injury and hyponatremia.  In the ER sodium was 119, creatinine 2.4.  Creatinine and sodium levels improved with IV fluids.  Cozaar and Lasix initially were held.  PT OT recommended SNF.  Patient was also seen by orthopedic who recommended weightbearing as tolerated with Cam boot and follow-up outpatient.  During the hospitalization also intermittently had diarrhea with slight melanotic stools but given his prior history this has been chronic in nature.  Previously it was decided if patient has overt bleeding then embolization would be an option.  For AKI and hyponatremia patient received IV fluid which improved his renal function and sodium levels. Patient eventually decided that he would like to discuss with hospice due to his gastric adenocarcinoma with recurrent gastric ulcer with intermittent melanotic stool.  Given chronic nature of his condition, patient would like to discuss case with hospice.   Assessment & Plan:  Principal Problem:   Hyponatremia   Chest discomfort with elevated troponin - EKG shows mild changes.  Relieved with nitro.  Troponin slightly trending up 39-74.  I discussed this with patient and he understands that we will not be pursuing this in terms of extensive workup as he is planning on going on hospice.  Gastric adenocarcinoma History of occult bleeding gastric ulcer with Melena -this was recently diagnosed April 2024 after EGD.  After extensive discussion with Dr. Mosetta Putt 11/28/2022, patient declined all treatments including immunotherapy and opted for hospice.  Continue PPI.  Have consulted TOC  so he can discuss his case with hospice team -Will give him 1 unit PRBC for now until he has had complete discussion regarding his comfort/hospice care going forward  Hyponatremia Acute kidney injury, prerenal - Admission creatinine 2.05, now back to baseline of 0.9.  Secondary to significant dehydration, poor oral intake and Lasix.  Currently these medicines have been stopped.  Renal function and sodium levels have both returned to normal.   Peripheral neuropathy -Gabapentin reduced due to AKI   Left foot fracture -Occurred after mechanical fall at the assisted living facility.  Seen by [facility] currently recommending weightbearing as tolerated with Cam boot and follow-up outpatient in 1 week with Dr. Odis Hollingshead post discharge -Pain control, bowel regimen  Blood loss Chronic anemia -likely due to known gastric adenocarcinoma, hemoglobin slowly trending down.  Will transfuse 1 unit PRBC -On iron supplements   Depression -continue home Wellbutrin   DM2, acceptable range -Currently glipizide on hold.  On sliding scale and Accu-Cheks  PT/OT= still pending   DVT prophylaxis: SCDs Start: 12/18/22 1026 Code Status: DNR Family Communication: Son updated Status is: Inpatient On following discussion with hospice care      Diet Orders (From admission, onward)     Start     Ordered   12/18/22 1343  Diet regular Fluid consistency: Thin  Diet effective now       Question:  Fluid consistency:  Answer:  Thin   12/18/22 1342            Subjective: This morning patient had chest pain which was relieved after getting nitro.  Examination: Constitutional: Not in acute distress Respiratory: Clear to auscultation  bilaterally Cardiovascular: Normal sinus rhythm, no rubs Abdomen: Nontender nondistended good bowel sounds Musculoskeletal: Left foot deformity Skin: No rashes seen Neurologic: CN 2-12 grossly intact.  And nonfocal Psychiatric: Normal judgment and insight. Alert and  oriented x 3. Normal mood. Objective: Vitals:   12/21/22 0633 12/21/22 1327 12/21/22 2141 12/22/22 0344  BP: (!) 138/49 (!) 149/67 135/60 (!) 138/53  Pulse: 78 70 (!) 54 62  Resp: 16 20 16 18   Temp: 98.4 F (36.9 C) 98.6 F (37 C) 98.2 F (36.8 C) 98.8 F (37.1 C)  TempSrc: Oral Oral Oral Oral  SpO2: 91% 91% 95% 92%  Weight:      Height:        Intake/Output Summary (Last 24 hours) at 12/22/2022 0735 Last data filed at 12/22/2022 0430 Gross per 24 hour  Intake 120 ml  Output 700 ml  Net -580 ml   Filed Weights   12/18/22 1348  Weight: 79.1 kg    Scheduled Meds:  sodium chloride   Intravenous Once   bupivacaine (PF)  10 mL Infiltration Once   buPROPion  300 mg Oral Daily   ferrous sulfate  325 mg Oral Q breakfast   gabapentin  400 mg Oral BID   insulin aspart  0-15 Units Subcutaneous TID WC   insulin aspart  0-5 Units Subcutaneous QHS   loratadine  10 mg Oral Daily   LORazepam  0.5-1 mg Oral QHS   pantoprazole  40 mg Oral BID   polyethylene glycol  17 g Oral Daily   primidone  50 mg Oral BID   senna-docusate  1 tablet Oral BID   tamsulosin  0.8 mg Oral QPC supper   Continuous Infusions:  sodium chloride 100 mL/hr at 12/21/22 1638    Nutritional status     Body mass index is 29.02 kg/m.  Data Reviewed:   CBC: Recent Labs  Lab 12/18/22 0859 12/19/22 0514 12/20/22 0509 12/21/22 0224 12/22/22 0500  WBC 7.7 4.6 4.6 4.3 4.1  NEUTROABS 5.7  --   --   --   --   HGB 9.4* 8.3* 8.1* 7.7* 7.7*  HCT 29.2* 26.0* 25.4* 24.4* 26.7*  MCV 83.0 84.1 84.4 85.9 93.7  PLT 331 251 250 238 260   Basic Metabolic Panel: Recent Labs  Lab 12/18/22 2322 12/19/22 0514 12/19/22 0858 12/20/22 0509 12/20/22 1102 12/21/22 0224 12/21/22 1047 12/21/22 1613 12/21/22 2048 12/22/22 0500  NA 121* 122*   < > 124*   < > 128* 130* 134* 133* 133*  K 3.6 3.4*  --  3.8  --  4.0  --   --   --  4.2  CL 84* 84*  --  91*  --  96*  --   --   --  102  CO2 30 31  --  26  --  26  --    --   --  22  GLUCOSE 135* 127*  --  164*  --  116*  --   --   --  107*  BUN 34* 31*  --  23  --  14  --   --   --  10  CREATININE 1.71* 1.65*  --  1.11  --  0.98  --   --   --  0.90  CALCIUM 7.9* 8.0*  --  8.0*  --  7.5*  --   --   --  7.7*  MG  --   --   --  1.9  --  1.9  --   --   --  1.8   < > = values in this interval not displayed.   GFR: Estimated Creatinine Clearance: 55 mL/min (by C-G formula based on SCr of 0.9 mg/dL). Liver Function Tests: Recent Labs  Lab 12/18/22 0859  AST 35  ALT 49*  ALKPHOS 220*  BILITOT 0.5  PROT 6.2*  ALBUMIN 3.1*   No results for input(s): "LIPASE", "AMYLASE" in the last 168 hours. No results for input(s): "AMMONIA" in the last 168 hours. Coagulation Profile: No results for input(s): "INR", "PROTIME" in the last 168 hours. Cardiac Enzymes: No results for input(s): "CKTOTAL", "CKMB", "CKMBINDEX", "TROPONINI" in the last 168 hours. BNP (last 3 results) No results for input(s): "PROBNP" in the last 8760 hours. HbA1C: No results for input(s): "HGBA1C" in the last 72 hours. CBG: Recent Labs  Lab 12/20/22 2157 12/21/22 0806 12/21/22 1152 12/21/22 1713 12/21/22 2133  GLUCAP 167* 130* 152* 105* 137*   Lipid Profile: No results for input(s): "CHOL", "HDL", "LDLCALC", "TRIG", "CHOLHDL", "LDLDIRECT" in the last 72 hours. Thyroid Function Tests: Recent Labs    12/19/22 0858  TSH 1.608   Anemia Panel: No results for input(s): "VITAMINB12", "FOLATE", "FERRITIN", "TIBC", "IRON", "RETICCTPCT" in the last 72 hours. Sepsis Labs: No results for input(s): "PROCALCITON", "LATICACIDVEN" in the last 168 hours.  No results found for this or any previous visit (from the past 240 hour(s)).       Radiology Studies: No results found.         LOS: 4 days   Time spent= 35 mins    Jeneen Doutt Joline Maxcy, MD [facility]  If 7PM-7AM, please contact night-coverage  12/22/2022, 7:35 AM

## 2022-12-22 NOTE — Progress Notes (Signed)
   12/22/22 0829  Assess: MEWS Score  Temp 99 F (37.2 C)  BP (!) 159/91  MAP (mmHg) 109  Pulse Rate (!) 112  Resp (!) 21  SpO2 96 %  Assess: MEWS Score  MEWS Temp 0  MEWS Systolic 0  MEWS Pulse 2  MEWS RR 1  MEWS LOC 0  MEWS Score 3  MEWS Score Color Yellow  Assess: if the MEWS score is Yellow or Red  Were vital signs taken at a resting state? Yes  Focused Assessment No change from prior assessment  Does the patient meet 2 or more of the SIRS criteria? Yes  Does the patient have a confirmed or suspected source of infection? No  MEWS guidelines implemented  Yes, yellow  Treat  MEWS Interventions Considered administering scheduled or prn medications/treatments as ordered  Take Vital Signs  Increase Vital Sign Frequency  Yellow: Q2hr x1, continue Q4hrs until patient remains green for 12hrs  Escalate  MEWS: Escalate Yellow: Discuss with charge nurse and consider notifying provider and/or RRT  Notify: Charge Nurse/RN  Name of Charge Nurse/RN Notified Ethan, Rapid Response  Provider Notification  Provider Name/Title Dr. Nelson Chimes  Date Provider Notified 12/22/22  Time Provider Notified 0830  Method of Notification Page  Notification Reason Other (Comment) (MEWS)  Provider response See new orders  Date of Provider Response 12/22/22  Time of Provider Response 0830  Notify: Rapid Response  Name of Rapid Response RN Notified Ethan, RN  Date Rapid Response Notified 12/22/22  Time Rapid Response Notified 0845  Assess: SIRS CRITERIA  SIRS Temperature  0  SIRS Pulse 1  SIRS Respirations  1  SIRS WBC 0  SIRS Score Sum  2

## 2022-12-23 ENCOUNTER — Inpatient Hospital Stay (HOSPITAL_COMMUNITY)

## 2022-12-23 DIAGNOSIS — E871 Hypo-osmolality and hyponatremia: Secondary | ICD-10-CM | POA: Diagnosis not present

## 2022-12-23 LAB — TYPE AND SCREEN
ABO/RH(D): A POS
Unit division: 0

## 2022-12-23 LAB — BASIC METABOLIC PANEL
Anion gap: 8 (ref 5–15)
BUN: 10 mg/dL (ref 8–23)
CO2: 24 mmol/L (ref 22–32)
Calcium: 7.9 mg/dL — ABNORMAL LOW (ref 8.9–10.3)
Chloride: 103 mmol/L (ref 98–111)
Creatinine, Ser: 0.89 mg/dL (ref 0.61–1.24)
GFR, Estimated: >60 mL/min (ref >60)
Glucose, Bld: 122 mg/dL — ABNORMAL HIGH (ref 70–99)
Potassium: 4.6 mmol/L (ref 3.5–5.1)
Sodium: 135 mmol/L (ref 135–145)

## 2022-12-23 LAB — MAGNESIUM: Magnesium: 1.7 mg/dL (ref 1.7–2.4)

## 2022-12-23 LAB — CBC
HCT: 29.5 % — ABNORMAL LOW (ref 39.0–52.0)
Hemoglobin: 9.3 g/dL — ABNORMAL LOW (ref 13.0–17.0)
MCH: 27.3 pg (ref 26.0–34.0)
MCHC: 31.5 g/dL (ref 30.0–36.0)
MCV: 86.5 fL (ref 80.0–100.0)
Platelets: 275 10*3/uL (ref 150–400)
RBC: 3.41 MIL/uL — ABNORMAL LOW (ref 4.22–5.81)
RDW: 14.4 % (ref 11.5–15.5)
WBC: 5.8 10*3/uL (ref 4.0–10.5)
nRBC: 0 % (ref 0.0–0.2)

## 2022-12-23 LAB — GLUCOSE, CAPILLARY
Glucose-Capillary: 151 mg/dL — ABNORMAL HIGH (ref 70–99)
Glucose-Capillary: 139 mg/dL — ABNORMAL HIGH (ref 70–99)
Glucose-Capillary: 120 mg/dL — ABNORMAL HIGH (ref 70–99)
Glucose-Capillary: 114 mg/dL — ABNORMAL HIGH (ref 70–99)
Glucose-Capillary: 135 mg/dL — ABNORMAL HIGH (ref 70–99)

## 2022-12-23 LAB — BPAM RBC: Blood Product Expiration Date: 202407152359

## 2022-12-23 NOTE — Progress Notes (Signed)
Physical Therapy Treatment Patient Details Name: [name] MRN: [id number] DOB: [birth year] Today's Date: 12/23/2022   History of Present Illness Patient is a 89 year old  male who was admitted to the hospital after a fall at his indepedent living facility resulting in a left foot fracture also found to have acute kidney injury and hyponatremia   PMH:  recent diagnosis of adenocarcinoma of stomach, BPH, HTN, GERD, HLD    PT Comments    The patient is pleasant, reporting right knee pain is greater than left foot  although the L foot is sore.  Patient was able to stand and take a few steps to recliner, CAM /aircast in place on left foot.  Patient will require 24/7 assistance at DC.  Recommendations for follow up therapy are one component of a multi-disciplinary discharge planning process, led by the attending physician.  Recommendations may be updated based on patient status, additional functional criteria and insurance authorization.  Follow Up Recommendations  Can patient physically be transported by private vehicle: No    Assistance Recommended at Discharge Frequent or constant Supervision/Assistance  Patient can return home with the following Two people to help with walking and/or transfers;A lot of help with bathing/dressing/bathroom;Assistance with cooking/housework;Assist for transportation;Help with stairs or ramp for entrance   Equipment Recommendations  None recommended by PT    Recommendations for Other Services       Precautions / Restrictions Precautions Precautions: Fall Precaution Comments: CAM boot air cast inflated, did not place top pice due to ill fitting and pressure on foor Other Brace: camboot L foot Restrictions Weight Bearing Restrictions: Yes LLE Weight Bearing: Weight bearing as tolerated Other Position/Activity Restrictions: Pt is reporting acute R knee pain     Mobility  Bed Mobility                    Transfers                    General transfer comment: He required cues for trunk extension and pushing through BUE in standing. He performed 2 total stands, requiring a seated rest break in between. He was able to take a couple lateral steps towards the [facility] using a RW, requiring cues for RW and stepping with BLE, then he required assist for transferring to the bedside chair.    Ambulation/Gait                   Stairs             Wheelchair Mobility    Modified Rankin (Stroke Patients Only)       Balance Overall balance assessment: History of Falls, Needs assistance Sitting-balance support: Bilateral upper extremity supported, Feet supported Sitting balance-Leahy Scale: Fair     Standing balance support: Bilateral upper extremity supported, During functional activity Standing balance-Leahy Scale: Poor                              Cognition Arousal/Alertness: Awake/alert Behavior During Therapy: WFL for tasks assessed/performed Overall Cognitive Status: Within Functional Limits for tasks assessed                                 General Comments: Pt was oriented to month and year, however not to day of the week. Able to follow simple commands without difficulty  Exercises      General Comments        Pertinent Vitals/Pain Pain Assessment Faces Pain Scale: Hurts even more Pain Location: R knee Pain Descriptors / Indicators: Discomfort, Grimacing Pain Intervention(s): Monitored during session    Home Living                          Prior Function            PT Goals (current goals can now be found in the care plan section) Progress towards PT goals: Progressing toward goals    Frequency    Min 1X/week      PT Plan Current plan remains appropriate    Co-evaluation PT/OT/SLP Co-Evaluation/Treatment: Yes Reason for Co-Treatment: For patient/therapist safety;To address functional/ADL transfers PT goals  addressed during session: Mobility/safety with mobility;Balance OT goals addressed during session: Proper use of Adaptive equipment and DME;Strengthening/ROM      AM-PAC PT "6 Clicks" Mobility   Outcome Measure  Help needed turning from your back to your side while in a flat bed without using bedrails?: A Little Help needed moving from lying on your back to sitting on the side of a flat bed without using bedrails?: A Little Help needed moving to and from a bed to a chair (including a wheelchair)?: A Lot Help needed standing up from a chair using your arms (e.g., wheelchair or bedside chair)?: A Lot Help needed to walk in hospital room?: Total Help needed climbing 3-5 steps with a railing? : Total 6 Click Score: 12    End of Session Equipment Utilized During Treatment: Gait belt Activity Tolerance: Patient tolerated treatment well Patient left: in chair;with call bell/phone within reach;with chair alarm set Nurse Communication: Mobility status PT Visit Diagnosis: Other abnormalities of gait and mobility (R26.89);History of falling (Z91.81);Pain Pain - Right/Left: Right Pain - part of body: Knee     Time: 4742-5956 PT Time Calculation (min) (ACUTE ONLY): 25 min  Charges:  $Therapeutic Activity: 8-22 mins                     Blanchard Kelch PT Acute Rehabilitation Services Office [phone] Weekend [phone]    Rada Hay 12/23/2022, 12:46 PM

## 2022-12-23 NOTE — Progress Notes (Signed)
PROGRESS NOTE    [name]  ZHY:[id number] DOB: 07/31/1932 DOA: 12/18/2022 PCP: [name], NP   Brief Narrative:  89 year old with history of recent diagnosis of adenocarcinoma of stomach planning to start Keytruda, BPH, HTN, GERD, HLD admitted to the hospital after a fall at her nursing facility resulting in a left foot fracture also found to have acute kidney injury and hyponatremia.  In the ER sodium was 119, creatinine 2.4.  Creatinine and sodium levels improved with IV fluids.  Cozaar and Lasix initially were held.  PT OT recommended SNF.  Patient was also seen by orthopedic who recommended weightbearing as tolerated with Cam boot and follow-up outpatient.  During the hospitalization also intermittently had diarrhea with slight melanotic stools but given his prior history this has been chronic in nature.  Previously it was decided if patient has overt bleeding then embolization would be an option.  For AKI and hyponatremia patient received IV fluid which improved his renal function and sodium levels. Patient eventually decided that he would like to discuss with hospice due to his gastric adenocarcinoma with recurrent gastric ulcer with intermittent melanotic stool.  Given chronic nature of his condition, patient would like to discuss case with hospice.  Currently TOC working on clarification if patient can return to his existing living facility with hospice.   Assessment & Plan:  Principal Problem:   Hyponatremia   Chest discomfort with elevated troponin - EKG shows mild changes.  Relieved with nitro.  Troponin slightly trending up 39-74.  TOC working with the facility if patient can return to [facility] with hospice.  Gastric adenocarcinoma History of occult bleeding gastric ulcer with Melena -this was recently diagnosed April 2024 after EGD.  After extensive discussion with Dr. Mosetta Putt 11/28/2022, patient declined all treatments including immunotherapy and opted for  hospice.  Continue PPI.  TOC working with facility if he can return with hospice - Received 1 unit of PRBC, hemoglobin 9.3.  Hyponatremia Acute kidney injury, prerenal - Admission creatinine 2.05, now back to baseline of 0.9.  This was secondary to dehydration   Peripheral neuropathy - Gabapentin   Left foot fracture -Occurred after mechanical fall at the assisted living facility.  Seen by [facility] currently recommending weightbearing as tolerated with Cam boot and follow-up outpatient in 1 week with Dr. Odis Hollingshead post discharge -Pain control, bowel regimen  Blood loss Chronic anemia -likely due to known gastric adenocarcinoma, hemoglobin slowly trending down.  Hemoglobin had drifted down to 7.2, after transfusion back to 9.3 -On iron supplements   Depression -continue home Wellbutrin   DM2, acceptable range -Currently glipizide on hold.  On sliding scale and Accu-Cheks  PT/OT is recommending SNF   DVT prophylaxis: SCDs Start: 12/18/22 1026 Code Status: DNR Family Communication: Son updated Status is: Inpatient On following discussion with hospice care to see if patient can return to his facility      Diet Orders (From admission, onward)     Start     Ordered   12/18/22 1343  Diet regular Fluid consistency: Thin  Diet effective now       Question:  Fluid consistency:  Answer:  Thin   12/18/22 1342            Subjective: Doing ok no new complaints.   Examination: Constitutional: Not in acute distress Respiratory: Clear to auscultation bilaterally Cardiovascular: Normal sinus rhythm, no rubs Abdomen: Nontender nondistended good bowel sounds Musculoskeletal:left foot deforminty.  Skin: No rashes seen Neurologic: CN 2-12 grossly intact.  And nonfocal Psychiatric: Normal judgment and insight. Alert and oriented x 3. Normal mood.   Objective: Vitals:   12/22/22 1738 12/22/22 2135 12/23/22 0151 12/23/22 0554  BP: (!) 144/65 (!) 139/59 (!) 140/63 (!)  153/73  Pulse: 67 69 82 88  Resp: 16 14 14 16   Temp: 98.7 F (37.1 C) 98.4 F (36.9 C) 98.6 F (37 C) 98.7 F (37.1 C)  TempSrc: Oral Oral Oral Oral  SpO2: 97% 94% 90% 97%  Weight:      Height:        Intake/Output Summary (Last 24 hours) at 12/23/2022 0849 Last data filed at 12/23/2022 0600 Gross per 24 hour  Intake 1113 ml  Output 925 ml  Net 188 ml   Filed Weights   12/18/22 1348  Weight: 79.1 kg    Scheduled Meds:  bupivacaine (PF)  10 mL Infiltration Once   buPROPion  300 mg Oral Daily   ferrous sulfate  325 mg Oral Q breakfast   gabapentin  400 mg Oral BID   insulin aspart  0-15 Units Subcutaneous TID WC   insulin aspart  0-5 Units Subcutaneous QHS   loratadine  10 mg Oral Daily   LORazepam  0.5-1 mg Oral QHS   pantoprazole  40 mg Oral BID   polyethylene glycol  17 g Oral Daily   primidone  50 mg Oral BID   senna-docusate  1 tablet Oral BID   tamsulosin  0.8 mg Oral QPC supper   Continuous Infusions:  sodium chloride 100 mL/hr at 12/21/22 1638    Nutritional status     Body mass index is 29.02 kg/m.  Data Reviewed:   CBC: Recent Labs  Lab 12/18/22 0859 12/19/22 0514 12/20/22 0509 12/21/22 0224 12/22/22 0500 12/23/22 0524  WBC 7.7 4.6 4.6 4.3 4.1 5.8  NEUTROABS 5.7  --   --   --   --   --   HGB 9.4* 8.3* 8.1* 7.7* 7.7* 9.3*  HCT 29.2* 26.0* 25.4* 24.4* 26.7* 29.5*  MCV 83.0 84.1 84.4 85.9 93.7 86.5  PLT 331 251 250 238 260 275   Basic Metabolic Panel: Recent Labs  Lab 12/19/22 0514 12/19/22 0858 12/20/22 0509 12/20/22 1102 12/21/22 0224 12/21/22 1047 12/21/22 1613 12/21/22 2048 12/22/22 0500 12/22/22 0753 12/23/22 0524  NA 122*   < > 124*   < > 128*   < > 134* 133* 133* 132* 135  K 3.4*  --  3.8  --  4.0  --   --   --  4.2  --  4.6  CL 84*  --  91*  --  96*  --   --   --  102  --  103  CO2 31  --  26  --  26  --   --   --  22  --  24  GLUCOSE 127*  --  164*  --  116*  --   --   --  107*  --  122*  BUN 31*  --  23  --  14  --    --   --  10  --  10  CREATININE 1.65*  --  1.11  --  0.98  --   --   --  0.90  --  0.89  CALCIUM 8.0*  --  8.0*  --  7.5*  --   --   --  7.7*  --  7.9*  MG  --   --  1.9  --  1.9  --   --   --  1.8  --  1.7   < > = values in this interval not displayed.   GFR: Estimated Creatinine Clearance: 55.6 mL/min (by C-G formula based on SCr of 0.89 mg/dL). Liver Function Tests: Recent Labs  Lab 12/18/22 0859  AST 35  ALT 49*  ALKPHOS 220*  BILITOT 0.5  PROT 6.2*  ALBUMIN 3.1*   No results for input(s): "LIPASE", "AMYLASE" in the last 168 hours. No results for input(s): "AMMONIA" in the last 168 hours. Coagulation Profile: No results for input(s): "INR", "PROTIME" in the last 168 hours. Cardiac Enzymes: No results for input(s): "CKTOTAL", "CKMB", "CKMBINDEX", "TROPONINI" in the last 168 hours. BNP (last 3 results) No results for input(s): "PROBNP" in the last 8760 hours. HbA1C: No results for input(s): "HGBA1C" in the last 72 hours. CBG: Recent Labs  Lab 12/22/22 0756 12/22/22 1222 12/22/22 1743 12/22/22 2136 12/23/22 0720  GLUCAP 111* 130* 114* 151* 139*   Lipid Profile: No results for input(s): "CHOL", "HDL", "LDLCALC", "TRIG", "CHOLHDL", "LDLDIRECT" in the last 72 hours. Thyroid Function Tests: No results for input(s): "TSH", "T4TOTAL", "FREET4", "T3FREE", "THYROIDAB" in the last 72 hours.  Anemia Panel: No results for input(s): "VITAMINB12", "FOLATE", "FERRITIN", "TIBC", "IRON", "RETICCTPCT" in the last 72 hours. Sepsis Labs: No results for input(s): "PROCALCITON", "LATICACIDVEN" in the last 168 hours.  No results found for this or any previous visit (from the past 240 hour(s)).       Radiology Studies: No results found.         LOS: 5 days   Time spent= 35 mins    Jaxxon Naeem Joline Maxcy, MD [facility]  If 7PM-7AM, please contact night-coverage  12/23/2022, 8:49 AM

## 2022-12-23 NOTE — Progress Notes (Signed)
Occupational Therapy Treatment Patient Details Name: [name] MRN: [id number] DOB: Nov 04, 1932 Today's Date: 12/23/2022   History of present illness Patient is a 89 year old  male who was admitted to the hospital after a fall at his indepedent living facility resulting in a left foot fracture also found to have acute kidney injury and hyponatremia   PMH:  recent diagnosis of adenocarcinoma of stomach, BPH, HTN, GERD, HLD   OT comments  Pt was seen for functional strengthening, increasing out of bed activity, and functional transfer training. He required 2 person assist for sit to stand using a RW, as well as to stand-step to the bedside chair using a RW. He required increased time and effort for most tasks, given pain, impaired functional mobility and generalized strength deficits. He was noted to report having acute R knee pain, that was worse in standing and with knee flexion (nurse and MD made aware). He will benefit from further OT services to maximize his safety and independence with ADLs and to decrease the risk for restricted participation in meaningful activities. Patient will benefit from continued inpatient follow up therapy, <3 hours/day    Recommendations for follow up therapy are one component of a multi-disciplinary discharge planning process, led by the attending physician.  Recommendations may be updated based on patient status, additional functional criteria and insurance authorization.    Assistance Recommended at Discharge Frequent or constant Supervision/Assistance  Patient can return home with the following  A lot of help with bathing/dressing/bathroom;Assistance with cooking/housework;Direct supervision/assist for medications management;Assist for transportation;A lot of help with walking and/or transfers   Equipment Recommendations  None recommended by OT       Precautions / Restrictions Precautions Precautions: Fall Other Brace: camboot L  foot Restrictions Weight Bearing Restrictions: Yes LLE Weight Bearing: Weight bearing as tolerated Other Position/Activity Restrictions: Pt is reporting acute R knee pain       Mobility Bed Mobility Overal bed mobility: Needs Assistance Bed Mobility: Supine to Sit     Supine to sit: Min assist, HOB elevated     General bed mobility comments: required assist for LLE management, as well as increased time    Transfers Overall transfer level: Needs assistance Equipment used: Rolling walker (2 wheels) Transfers: Sit to/from Stand, Bed to chair/wheelchair/BSC Sit to Stand: +2 physical assistance, Mod assist     Step pivot transfers: +2 physical assistance, Mod assist     General transfer comment: He required cues for trunk extension and pushing through BUE in standing. He performed 2 total stands, requiring a seated rest break in between. He was able to take a couple lateral steps towards the [facility] using a RW, requiring cues for RW and stepping with BLE, then he required assist for transferring to the bedside chair.         ADL either performed or assessed with clinical judgement   ADL Overall ADL's : Needs assistance/impaired Eating/Feeding: Independent;Sitting Eating/Feeding Details (indicate cue type and reason): based on clinical judgement Grooming: Set up;Sitting Grooming Details (indicate cue type and reason): simulated at chair level         Upper Body Dressing : Set up;Sitting Upper Body Dressing Details (indicate cue type and reason): simulated at chair level Lower Body Dressing: Maximal assistance;Sit to/from stand       Toileting- Architect and Hygiene: Maximal assistance;Sit to/from stand               Vision Baseline Vision/History: 1 Wears glasses  Cognition Arousal/Alertness: Awake/alert Behavior During Therapy: WFL for tasks assessed/performed Overall Cognitive Status: Within Functional Limits for tasks assessed           General Comments: Pt was oriented to month and year, however not to day of the week. Able to follow simple commands without difficulty                   Pertinent Vitals/ Pain       Pain Assessment Pain Assessment: Faces Pain Score: 6  Pain Location: R knee Pain Intervention(s): Limited activity within patient's tolerance, Monitored during session, Informed pt's nurse and MD, Ice applied to R knee         Frequency  Min 1X/week        Progress Toward Goals  OT Goals(current goals can now be found in the care plan section)  Progress towards OT goals: Progressing toward goals  Acute Rehab OT Goals Patient Stated Goal: to get back to ILF OT Goal Formulation: With patient Time For Goal Achievement: 01/04/23 Potential to Achieve Goals: Good  Plan Discharge plan remains appropriate    Co-evaluation    PT/OT/SLP Co-Evaluation/Treatment: Yes Reason for Co-Treatment: For patient/therapist safety;To address functional/ADL transfers PT goals addressed during session: Mobility/safety with mobility;Balance OT goals addressed during session: Proper use of Adaptive equipment and DME;Strengthening/ROM      AM-PAC OT "6 Clicks" Daily Activity     Outcome Measure   Help from another person eating meals?: None Help from another person taking care of personal grooming?: A Little Help from another person toileting, which includes using toliet, bedpan, or urinal?: A Lot Help from another person bathing (including washing, rinsing, drying)?: A Lot Help from another person to put on and taking off regular upper body clothing?: A Little Help from another person to put on and taking off regular lower body clothing?: A Lot 6 Click Score: 16    End of Session Equipment Utilized During Treatment: Gait belt;Rolling walker (2 wheels)  OT Visit Diagnosis: Unsteadiness on feet (R26.81);Other abnormalities of gait and mobility (R26.89);Pain Pain - Right/Left: Right Pain - part  of body: Knee   Activity Tolerance Patient limited by pain   Patient Left in chair;with call bell/phone within reach;with chair alarm set   Nurse Communication Other (comment) (acute R knee pain)        Time: 1610-9604 OT Time Calculation (min): 26 min  Charges: OT General Charges $OT Visit: 1 Visit OT Treatments $Therapeutic Activity: 8-22 mins     Reuben Likes, OTR/L 12/23/2022, 12:19 PM

## 2022-12-24 DIAGNOSIS — S92812B Other fracture of left foot, initial encounter for open fracture: Secondary | ICD-10-CM | POA: Diagnosis not present

## 2022-12-24 DIAGNOSIS — D500 Iron deficiency anemia secondary to blood loss (chronic): Secondary | ICD-10-CM

## 2022-12-24 DIAGNOSIS — C169 Malignant neoplasm of stomach, unspecified: Secondary | ICD-10-CM | POA: Diagnosis not present

## 2022-12-24 DIAGNOSIS — E871 Hypo-osmolality and hyponatremia: Secondary | ICD-10-CM | POA: Diagnosis not present

## 2022-12-24 DIAGNOSIS — Y92129 Unspecified place in nursing home as the place of occurrence of the external cause: Secondary | ICD-10-CM

## 2022-12-24 DIAGNOSIS — W19XXXA Unspecified fall, initial encounter: Secondary | ICD-10-CM

## 2022-12-24 LAB — CBC
HCT: 30.4 % — ABNORMAL LOW (ref 39.0–52.0)
Hemoglobin: 9.6 g/dL — ABNORMAL LOW (ref 13.0–17.0)
MCH: 27.7 pg (ref 26.0–34.0)
MCHC: 31.6 g/dL (ref 30.0–36.0)
MCV: 87.9 fL (ref 80.0–100.0)
Platelets: 263 10*3/uL (ref 150–400)
RBC: 3.46 MIL/uL — ABNORMAL LOW (ref 4.22–5.81)
RDW: 14.6 % (ref 11.5–15.5)
WBC: 5 10*3/uL (ref 4.0–10.5)
nRBC: 0 % (ref 0.0–0.2)

## 2022-12-24 LAB — BASIC METABOLIC PANEL
Anion gap: 6 (ref 5–15)
BUN: 10 mg/dL (ref 8–23)
CO2: 22 mmol/L (ref 22–32)
Calcium: 7.8 mg/dL — ABNORMAL LOW (ref 8.9–10.3)
Chloride: 104 mmol/L (ref 98–111)
Creatinine, Ser: 0.92 mg/dL (ref 0.61–1.24)
GFR, Estimated: >60 mL/min (ref >60)
Glucose, Bld: 111 mg/dL — ABNORMAL HIGH (ref 70–99)
Potassium: 4.5 mmol/L (ref 3.5–5.1)
Sodium: 132 mmol/L — ABNORMAL LOW (ref 135–145)

## 2022-12-24 LAB — GLUCOSE, CAPILLARY
Glucose-Capillary: 80 mg/dL (ref 70–99)
Glucose-Capillary: 108 mg/dL — ABNORMAL HIGH (ref 70–99)
Glucose-Capillary: 99 mg/dL (ref 70–99)
Glucose-Capillary: 162 mg/dL — ABNORMAL HIGH (ref 70–99)
Glucose-Capillary: 147 mg/dL — ABNORMAL HIGH (ref 70–99)

## 2022-12-24 LAB — MAGNESIUM: Magnesium: 1.6 mg/dL — ABNORMAL LOW (ref 1.7–2.4)

## 2022-12-24 MED ORDER — MAGNESIUM SULFATE 2 GM/50ML IV SOLN
2.0000 g | Freq: Once | INTRAVENOUS | Status: AC
  Administered 2022-12-24: 2 g via INTRAVENOUS
  Filled 2022-12-24: qty 50

## 2022-12-24 NOTE — Progress Notes (Signed)
PROGRESS NOTE  [name] ZOX:[id number] DOB: 28-Nov-1932   PCP: [name], NP  Patient is from: ILF  DOA: 12/18/2022 LOS: 6  Chief complaints Chief Complaint  Patient presents with   Fall   Foot Pain     Brief Narrative / Interim history: 89 year old with history of recent diagnosis of adenocarcinoma of stomach planning to start Keytruda, BPH, HTN, GERD, HLD admitted to the hospital after a fall at her nursing facility resulting in a left foot fracture also found to have acute kidney injury and hyponatremia.  In the ER sodium was 119, creatinine 2.4.  Creatinine and sodium levels improved with IV fluids.  Cozaar and Lasix initially were held.  PT OT recommended SNF.  Patient was also seen by orthopedic who recommended weightbearing as tolerated with Cam boot and follow-up outpatient.  During the hospitalization also intermittently had diarrhea with slight melanotic stools but given his prior history this has been chronic in nature.  Previously it was decided if patient has overt bleeding then embolization would be an option.  For AKI and hyponatremia patient received IV fluid which improved his renal function and sodium levels. Patient eventually decided that he would like to discuss with hospice due to his gastric adenocarcinoma with recurrent gastric ulcer with intermittent melanotic stool.  Given chronic nature of his condition, patient would like to discuss case with hospice.  Currently TOC working on clarification if patient can return to his existing living facility with hospice.  Subjective: Seen and examined earlier this morning.  No major events overnight of this morning.  Complaining of significant low back pain.  Also reports some pain in his left foot.  Objective: Vitals:   12/23/22 1345 12/23/22 2012 12/24/22 0446 12/24/22 1433  BP: (!) 141/63 138/62 137/62 129/67  Pulse: 72 80 85 99  Resp:  18 18 16   Temp:  98.1 F (36.7 C) 97.7 F (36.5 C) 99 F (37.2  C)  TempSrc:  Oral Oral Oral  SpO2: 98% 93% 94% (!) 89%  Weight:      Height:        Examination:  GENERAL: No apparent distress.  Nontoxic. HEENT: MMM.  Vision and hearing grossly intact.  NECK: Supple.  No apparent JVD.  RESP:  No IWOB.  Fair aeration bilaterally. CVS:  RRR. Heart sounds normal.  ABD/GI/GU: BS+. Abd soft, NTND.  MSK/EXT:  Moves extremities.  BLE edema.  Left foot swelling with erythema.  Tender to palpation. SKIN: no apparent skin lesion or wound NEURO: Awake, alert and oriented appropriately.  Resting tremor.  No apparent focal neuro deficit. PSYCH: Appears anxious.  Procedures:  None  Microbiology summarized: None  Assessment and plan: Principal Problem:   Hyponatremia  Chest discomfort with elevated troponin: EKG shows mild changes.  Relieved with nitro.  Troponin slightly trending up 39-74.  Doubt need for invasive cardiac workup as plan is to discharge to facility with hospice.    Gastric adenocarcinoma/history of occult bleeding gastric ulcer with melena: Diagnosed April 2024 after EGD.  After extensive discussion with Dr. Mosetta Putt 11/28/2022, patient declined all treatments including immunotherapy and opted for hospice.  H&H stable after 1 unit. Recent Labs    11/16/22 0530 11/28/22 1310 12/18/22 0842 12/18/22 0859 12/19/22 0514 12/20/22 0509 12/21/22 0224 12/22/22 0500 12/23/22 0524 12/24/22 0548  HGB 8.3* 9.9* 11.6* 9.4* 8.3* 8.1* 7.7* 7.7* 9.3* 9.6*  -Continue PPI  Hyponatremia/AKI: Likely prerenal.  Baseline about 0.9.  Resolved. Recent Labs    12/18/22 [phone]  12/18/22 1307 12/18/22 1613 12/18/22 2322 12/19/22 0514 12/20/22 0509 12/21/22 0224 12/22/22 0500 12/23/22 0524 12/24/22 0548  BUN 39* 37* 36* 34* 31* 23 14 10 10 10   CREATININE 2.31* 2.05* 2.06* 1.71* 1.65* 1.11 0.98 0.90 0.89 0.92     Left foot fracture: Occurred after mechanical fall at the assisted living facility.  Seen by Raechel Chute who recommended WBAT with CAM  boot and outpatient follow-up with Dr. Odis Hollingshead post discharge -Pain control, bowel regimen   Blood loss Chronic anemia-see gastric adenocarcinoma above  Peripheral neuropathy -Continue gabapentin   Depression -continue home Wellbutrin   DM2, acceptable range -Currently glipizide on hold.  On sliding scale and Accu-Cheks  Hypomagnesemia -Monitor replenish as appropriate   PT/OT is recommending SNF  Body mass index is 29.02 kg/m.           DVT prophylaxis:  SCDs Start: 12/18/22 1026  Code Status: DNR/DNI Family Communication: None at bedside Level of care: Med-Surg Status is: Inpatient Remains inpatient appropriate because: Disposition   Final disposition: ALF with hospice? Consultants:  Orthopedic surgery Oncology   35 minutes with more than 50% spent in reviewing records, counseling patient/family and coordinating care.   Sch Meds:  Scheduled Meds:  buPROPion  300 mg Oral Daily   ferrous sulfate  325 mg Oral Q breakfast   gabapentin  400 mg Oral BID   insulin aspart  0-15 Units Subcutaneous TID WC   insulin aspart  0-5 Units Subcutaneous QHS   loratadine  10 mg Oral Daily   LORazepam  0.5-1 mg Oral QHS   pantoprazole  40 mg Oral BID   polyethylene glycol  17 g Oral Daily   primidone  50 mg Oral BID   senna-docusate  1 tablet Oral BID   tamsulosin  0.8 mg Oral QPC supper   Continuous Infusions:  sodium chloride 100 mL/hr at 12/24/22 1514   PRN Meds:.acetaminophen **OR** acetaminophen, alum & mag hydroxide-simeth, guaiFENesin, hydrALAZINE, hydrocortisone, hydrocortisone cream, ipratropium-albuterol, lip balm, loratadine, metoprolol tartrate, morphine, morphine injection, Muscle Rub, nitroGLYCERIN, ondansetron **OR** ondansetron (ZOFRAN) IV, phenol, polyvinyl alcohol, senna-docusate, sodium chloride, traZODone  Antimicrobials: Anti-infectives (From admission, onward)    None        I have personally reviewed the following labs and  images: CBC: Recent Labs  Lab 12/18/22 0859 12/19/22 0514 12/20/22 0509 12/21/22 0224 12/22/22 0500 12/23/22 0524 12/24/22 0548  WBC 7.7   < > 4.6 4.3 4.1 5.8 5.0  NEUTROABS 5.7  --   --   --   --   --   --   HGB 9.4*   < > 8.1* 7.7* 7.7* 9.3* 9.6*  HCT 29.2*   < > 25.4* 24.4* 26.7* 29.5* 30.4*  MCV 83.0   < > 84.4 85.9 93.7 86.5 87.9  PLT 331   < > 250 238 260 275 263   < > = values in this interval not displayed.   BMP &GFR Recent Labs  Lab 12/20/22 0509 12/20/22 1102 12/21/22 0224 12/21/22 1047 12/21/22 2048 12/22/22 0500 12/22/22 0753 12/23/22 0524 12/24/22 0548  NA 124*   < > 128*   < > 133* 133* 132* 135 132*  K 3.8  --  4.0  --   --  4.2  --  4.6 4.5  CL 91*  --  96*  --   --  102  --  103 104  CO2 26  --  26  --   --  22  --  24 22  GLUCOSE 164*  --  116*  --   --  107*  --  122* 111*  BUN 23  --  14  --   --  10  --  10 10  CREATININE 1.11  --  0.98  --   --  0.90  --  0.89 0.92  CALCIUM 8.0*  --  7.5*  --   --  7.7*  --  7.9* 7.8*  MG 1.9  --  1.9  --   --  1.8  --  1.7 1.6*   < > = values in this interval not displayed.   Estimated Creatinine Clearance: 53.8 mL/min (by C-G formula based on SCr of 0.92 mg/dL). Liver & Pancreas: Recent Labs  Lab 12/18/22 0859  AST 35  ALT 49*  ALKPHOS 220*  BILITOT 0.5  PROT 6.2*  ALBUMIN 3.1*   No results for input(s): "LIPASE", "AMYLASE" in the last 168 hours. No results for input(s): "AMMONIA" in the last 168 hours. Diabetic: No results for input(s): "HGBA1C" in the last 72 hours. Recent Labs  Lab 12/23/22 1652 12/23/22 2112 12/24/22 0726 12/24/22 1120 12/24/22 1621  GLUCAP 114* 135* 80 108* 99   Cardiac Enzymes: No results for input(s): "CKTOTAL", "CKMB", "CKMBINDEX", "TROPONINI" in the last 168 hours. No results for input(s): "PROBNP" in the last 8760 hours. Coagulation Profile: No results for input(s): "INR", "PROTIME" in the last 168 hours. Thyroid Function Tests: No results for input(s): "TSH",  "T4TOTAL", "FREET4", "T3FREE", "THYROIDAB" in the last 72 hours. Lipid Profile: No results for input(s): "CHOL", "HDL", "LDLCALC", "TRIG", "CHOLHDL", "LDLDIRECT" in the last 72 hours. Anemia Panel: No results for input(s): "VITAMINB12", "FOLATE", "FERRITIN", "TIBC", "IRON", "RETICCTPCT" in the last 72 hours. Urine analysis:    Component Value Date/Time   COLORURINE YELLOW 12/18/2022 1153   APPEARANCEUR CLEAR 12/18/2022 1153   LABSPEC 1.010 12/18/2022 1153   PHURINE 5.0 12/18/2022 1153   GLUCOSEU NEGATIVE 12/18/2022 1153   HGBUR NEGATIVE 12/18/2022 1153   BILIRUBINUR NEGATIVE 12/18/2022 1153   BILIRUBINUR neg 08/04/2011 2026   KETONESUR NEGATIVE 12/18/2022 1153   PROTEINUR NEGATIVE 12/18/2022 1153   UROBILINOGEN 0.2 03/29/2012 1438   NITRITE NEGATIVE 12/18/2022 1153   LEUKOCYTESUR NEGATIVE 12/18/2022 1153   Sepsis Labs: Invalid input(s): "PROCALCITONIN", "LACTICIDVEN"  Microbiology: No results found for this or any previous visit (from the past 240 hour(s)).  Radiology Studies: No results found.    [name] T. [name] [facility]  If 7PM-7AM, please contact night-coverage [email or web address] 12/24/2022, 5:14 PM

## 2022-12-24 NOTE — TOC Progression Note (Signed)
Transition of Care [facility]) - Progression Note    Patient Details  Name: [name] MRN: [id number] Date of Birth: Sep 25, 1932  Transition of Care [facility]) CM/SW Contact  Geni Bers, RN Phone Number: 12/24/2022, 9:00 AM  Clinical Narrative:     Spoke with pt's son [name] concerning discharge plan. [name] was not sure and will talk to pt concerning discharge plan.   Expected Discharge Plan: Skilled Nursing Facility (vs. 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 photographer                                       Social Determinants of Health (SDOH) Interventions SDOH Screenings   Food Insecurity: No Food Insecurity (12/18/2022)  Housing: Low Risk  (12/18/2022)  Transportation Needs: No Transportation Needs (12/18/2022)  Utilities: Not At Risk (12/18/2022)  Depression (PHQ2-9): Low Risk  (10/10/2022)  Tobacco Use: Low Risk  (12/18/2022)    Readmission Risk Interventions    12/22/2022    1:00 PM  Readmission Risk Prevention Plan  Transportation Screening Complete  PCP or Specialist Appt within 3-5 Days Complete  HRI or Home Care Consult Complete  Social Work Consult for Recovery Care Planning/Counseling Complete  Palliative Care Screening Complete  Medication Review Oceanographer) Complete

## 2022-12-25 DIAGNOSIS — C169 Malignant neoplasm of stomach, unspecified: Secondary | ICD-10-CM

## 2022-12-25 DIAGNOSIS — W19XXXA Unspecified fall, initial encounter: Secondary | ICD-10-CM

## 2022-12-25 DIAGNOSIS — D500 Iron deficiency anemia secondary to blood loss (chronic): Secondary | ICD-10-CM

## 2022-12-25 DIAGNOSIS — E871 Hypo-osmolality and hyponatremia: Secondary | ICD-10-CM | POA: Diagnosis not present

## 2022-12-25 DIAGNOSIS — S92812B Other fracture of left foot, initial encounter for open fracture: Secondary | ICD-10-CM

## 2022-12-25 DIAGNOSIS — Y92129 Unspecified place in nursing home as the place of occurrence of the external cause: Secondary | ICD-10-CM

## 2022-12-25 LAB — GLUCOSE, CAPILLARY
Glucose-Capillary: 91 mg/dL (ref 70–99)
Glucose-Capillary: 108 mg/dL — ABNORMAL HIGH (ref 70–99)
Glucose-Capillary: 111 mg/dL — ABNORMAL HIGH (ref 70–99)

## 2022-12-25 NOTE — Progress Notes (Signed)
Chaplain engaged in an initial visit with [facility]. Stephenson shared that he is originally from [place], [place]. 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 Chaplain could hear his energy about life.   Hazaiah also spent time talking about the changes in his health. He shared that he used to exercise regularly and engage in different activities throughout the week. He recognizes how his life has changed but he expressed a deep acceptance for it. He voiced that he has lived a full life and whatever God decides, he is ready.   Chaplain offered space for sharing, processing his life, sharing his faith, and prayer. Chaplain submitted request to a [place] for further support. Zorita Pang will be coming today.   Chaplain Evone Arseneau, Mdiv   12/25/22 1400  Spiritual Encounters  Type of Visit Initial  Care provided to: Patient  Reason for visit Routine spiritual support  Spiritual Framework  Presenting Themes Rituals and practive;Community and relationships;Significant life change;Impactful experiences and emotions;Meaning/purpose/sources of inspiration  Community/Connection Faith community;Family  Strengths Faith  Interventions  Spiritual Care Interventions Made Prayer;Encouragement;Reflective listening;Compassionate presence;Established relationship of care and support;Normalization of emotions;Explored values/beliefs/practices/strengths  Intervention Outcomes  Outcomes Connection to spiritual care;Awareness of support;Awareness around self/spiritual resourses;Connected to spiritual community

## 2022-12-25 NOTE — TOC Progression Note (Addendum)
Transition of Care [facility]) - Progression Note    Patient Details  Name: [name] MRN: [id number] Date of Birth: 29-Sep-1932  [facility]) CM/SW Contact  Adrian Prows, RN Phone Number: 12/25/2022, 10:19 AM  Clinical Narrative:    Attempted to contact pt's son [name] [phone]) to discuss d/c plan; LVM; awaiting return call.  -[phone]- pt's son called; discuss d/c options SNF vs returning [place] w/ 24/7 caregiver paid out of pocket; pt's son chose SNF; discussed SNF process; he would like bed search limited to [facility]; he does not have a facility preference.  -[phone]- FL2 faxed out; awaiting bed offers   Expected Discharge Plan: Skilled Nursing Facility (vs. 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 photographer                                       Social Determinants of Health (SDOH) Interventions SDOH Screenings   Food Insecurity: No Food Insecurity (12/18/2022)  Housing: Low Risk  (12/18/2022)  Transportation Needs: No Transportation Needs (12/18/2022)  Utilities: Not At Risk (12/18/2022)  Depression (PHQ2-9): Low Risk  (10/10/2022)  Tobacco Use: Low Risk  (12/18/2022)    Readmission Risk Interventions    12/22/2022    1:00 PM  Readmission Risk Prevention Plan  Transportation Screening Complete  PCP or Specialist Appt within 3-5 Days Complete  HRI or Home Care Consult Complete  Social Work Consult for Recovery Care Planning/Counseling Complete  Palliative Care Screening Complete  Medication Review Oceanographer) Complete

## 2022-12-25 NOTE — [place] FL2 (Signed)
[place] MEDICAID FL2 LEVEL OF CARE FORM     IDENTIFICATION  Patient Name: Killian Schwer Birthdate: 1932/12/29 Sex: male Admission Date (Current Location): 12/18/2022  [facility] and [place] Number:  Producer, television/film/video and Address:  [facility],  [address] [place], [place] [address]      Provider Number: [id number]  Attending Physician Name and Address:  Almon Hercules, MD  Relative Name and Phone Number:  Sabino Gasser (son) [phone]    Current Level of Care: SNF Recommended Level of Care: Skilled Nursing Facility Prior Approval Number:    Date Approved/Denied:   PASRR Number: [id number]  Discharge Plan: SNF    Current Diagnoses: Patient Active Problem List   Diagnosis Date Noted   Hyponatremia 12/18/2022   General weakness 11/16/2022   Palliative care by specialist 11/14/2022   Elevated troponin 11/14/2022   Symptomatic anemia 11/14/2022   GIB (gastrointestinal bleeding) 11/13/2022   Chest pain 11/13/2022   ABLA (acute blood loss anemia) 11/13/2022   Goals of care, counseling/discussion 10/24/2022   Gastric cancer (HCC) 10/09/2022   Abnormal CT scan, stomach 09/18/2022   Gastric ulcer without hemorrhage or perforation 09/18/2022   Leg swelling 03/12/2021   Acute cholecystitis 07/29/2019   Sinus bradycardia 11/26/2016   Prostate cancer (HCC) 02/03/2013   ANXIETY 03/07/2010   Essential hypertension, benign 03/07/2010   GERD 03/07/2010   HOARSENESS, CHRONIC 03/07/2010   DIABETES MELLITUS 03/06/2010   HYPERCHOLESTEROLEMIA 03/06/2010   DEPRESSION 03/06/2010   HIATAL HERNIA WITH REFLUX 03/06/2010   DIVERTICULOSIS, COLON 03/06/2010   IBS 03/06/2010   RENAL CALCULUS, RECURRENT 03/06/2010   COUGH 03/06/2010    Orientation RESPIRATION BLADDER Height & Weight     Self, Time, Situation  Normal Incontinent Weight: 79.1 kg Height:  5\' 5"  (165.1 cm)  BEHAVIORAL SYMPTOMS/MOOD NEUROLOGICAL BOWEL NUTRITION STATUS      Incontinent Diet  (regular)  AMBULATORY STATUS COMMUNICATION OF NEEDS Skin   Extensive Assist Verbally Other (Comment) (erythema to bilateral upper and lower extremities, and left foot)                       Personal Care Assistance Level of Assistance  Bathing, Feeding, Dressing Bathing Assistance: Maximum assistance Feeding assistance: Independent Dressing Assistance: Maximum assistance     Functional Limitations Info  Sight, Hearing, Speech Sight Info: Impaired Hearing Info: Impaired Speech Info: Adequate    SPECIAL CARE FACTORS FREQUENCY  PT (By licensed PT), OT (By licensed OT)     PT Frequency: 5x/week OT Frequency: 5x/week            Contractures Contractures Info: Not present    Additional Factors Info  Code Status, Allergies Code Status Info: DNR Allergies Info: NKDA           Current Medications (12/25/2022):  This is the current hospital active medication list Current Facility-Administered Medications  Medication Dose Route Frequency Provider Last Rate Last Admin   0.9 %  sodium chloride infusion   Intravenous Continuous Kirby Crigler, Mir M, MD 100 mL/hr at 12/25/22 1000 New Bag at 12/25/22 1000   acetaminophen (TYLENOL) tablet 650 mg  650 mg Oral Q6H PRN Maryln Gottron, MD   650 mg at 12/23/22 4034   Or   acetaminophen (TYLENOL) suppository 650 mg  650 mg Rectal Q6H PRN Kirby Crigler, Mir M, MD       alum & mag hydroxide-simeth (MAALOX/MYLANTA) 200-200-20 MG/5ML suspension 30 mL  30 mL Oral Q4H PRN Amin, Ankit Chirag,  MD       buPROPion (WELLBUTRIN XL) 24 hr tablet 300 mg  300 mg Oral Daily Kirby Crigler, Mir M, MD   300 mg at 12/25/22 0630   ferrous sulfate tablet 325 mg  325 mg Oral Q breakfast Kirby Crigler, Mir M, MD   325 mg at 12/25/22 0914   gabapentin (NEURONTIN) capsule 400 mg  400 mg Oral BID Kirby Crigler, Mir M, MD   400 mg at 12/25/22 1601   guaiFENesin (ROBITUSSIN) 100 MG/5ML liquid 5 mL  5 mL Oral Q4H PRN Amin, Ankit Chirag, MD       hydrALAZINE (APRESOLINE)  injection 10 mg  10 mg Intravenous Q4H PRN Amin, Ankit Chirag, MD       hydrocortisone (ANUSOL-HC) 2.5 % rectal cream 1 Application  1 Application Topical QID PRN Amin, Ankit Chirag, MD       hydrocortisone cream 1 % 1 Application  1 Application Topical TID PRN Amin, Ankit Chirag, MD       insulin aspart (novoLOG) injection 0-15 Units  0-15 Units Subcutaneous TID WC Kirby Crigler, Mir M, MD   2 Units at 12/23/22 0845   insulin aspart (novoLOG) injection 0-5 Units  0-5 Units Subcutaneous QHS Kirby Crigler, Mir M, MD       ipratropium-albuterol (DUONEB) 0.5-2.5 (3) MG/3ML nebulizer solution 3 mL  3 mL Nebulization Q4H PRN Amin, Ankit Chirag, MD       lip balm (CARMEX) ointment 1 Application  1 Application Topical PRN Amin, Ankit Chirag, MD       loratadine (CLARITIN) tablet 10 mg  10 mg Oral Daily Kirby Crigler, Mir M, MD   10 mg at 12/25/22 0915   loratadine (CLARITIN) tablet 10 mg  10 mg Oral Daily PRN Amin, Ankit Chirag, MD       LORazepam (ATIVAN) tablet 0.5-1 mg  0.5-1 mg Oral QHS Kirby Crigler, Mir M, MD   1 mg at 12/24/22 2145   metoprolol tartrate (LOPRESSOR) injection 5 mg  5 mg Intravenous Q4H PRN Amin, Ankit Chirag, MD       morphine (MSIR) tablet 15 mg  15 mg Oral Q6H PRN Kirby Crigler, Mir M, MD   15 mg at 12/24/22 2145   morphine (PF) 2 MG/ML injection 2 mg  2 mg Intravenous Q4H PRN Kirby Crigler, Mir M, MD   2 mg at 12/25/22 0947   Muscle Rub CREA 1 Application  1 Application Topical PRN Amin, Ankit Chirag, MD       nitroGLYCERIN (NITROSTAT) SL tablet 0.4 mg  0.4 mg Sublingual Q5 min PRN Amin, Ankit Chirag, MD   0.4 mg at 12/22/22 0849   ondansetron (ZOFRAN) tablet 4 mg  4 mg Oral Q6H PRN Kirby Crigler, Mir M, MD       Or   ondansetron (ZOFRAN) injection 4 mg  4 mg Intravenous Q6H PRN Kirby Crigler, Mir M, MD       pantoprazole (PROTONIX) EC tablet 40 mg  40 mg Oral BID Kirby Crigler, Mir M, MD   40 mg at 12/25/22 0914   phenol (CHLORASEPTIC) mouth spray 1 spray  1 spray Mouth/Throat PRN Amin, Ankit Chirag, MD    1 spray at 12/24/22 1100   polyethylene glycol (MIRALAX / GLYCOLAX) packet 17 g  17 g Oral Daily Kirby Crigler, Mir M, MD   17 g at 12/19/22 0932   polyvinyl alcohol (LIQUIFILM TEARS) 1.4 % ophthalmic solution 1 drop  1 drop Both Eyes PRN Amin, Ankit Chirag, MD       primidone (MYSOLINE) tablet 50 mg  50 mg Oral BID Kirby Crigler, Mir M, MD   50 mg at 12/25/22 0920   senna-docusate (Senokot-S) tablet 1 tablet  1 tablet Oral BID Maryln Gottron, MD   1 tablet at 12/23/22 2145   senna-docusate (Senokot-S) tablet 1 tablet  1 tablet Oral QHS PRN Dimple Nanas, MD   1 tablet at 12/19/22 2136   sodium chloride (OCEAN) 0.65 % nasal spray 1 spray  1 spray Each Nare PRN Amin, Ankit Chirag, MD       tamsulosin (FLOMAX) capsule 0.8 mg  0.8 mg Oral QPC supper Kirby Crigler, Mir M, MD   0.8 mg at 12/24/22 1743   traZODone (DESYREL) tablet 50 mg  50 mg Oral QHS PRN Dimple Nanas, MD         Discharge Medications: Please see discharge summary for a list of discharge medications.  Relevant Imaging Results:  Relevant Lab Results:   Additional Information SSN [id number]  Adrian Prows, RN

## 2022-12-25 NOTE — Care Management Important Message (Signed)
Important Message  Patient Details IM Letter given. Name: Vasco Chong MRN: [id number] Date of Birth: [birth year]   Medicare Important Message Given:  Yes     [name] 12/25/2022, 9:35 AM

## 2022-12-25 NOTE — Progress Notes (Signed)
Physical Therapy Treatment Patient Details Name: [name] MRN: [id number] DOB: July 11, 1932 Today's Date: 12/25/2022   History of Present Illness Patient is a 89 year old  male who was admitted to the hospital after a fall at his indepedent living facility resulting in a left foot fracture also found to have acute kidney injury and hyponatremia   PMH:  recent diagnosis of adenocarcinoma of stomach, BPH, HTN, GERD, HLD    PT Comments    PT treatment limited  to bed level  activity. Patient declined OOB, stating that his right foot was painful with the boot.  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 Patient accepted ice cream.  Assisted with repositioning in bed for comfort.Continue PT mobility efforts as tolerated.  Recommendations for follow up therapy are one component of a multi-disciplinary discharge planning process, led by the attending physician.  Recommendations may be updated based on patient status, additional functional criteria and insurance authorization.  Follow Up Recommendations  Can patient physically be transported by private vehicle: No    Assistance Recommended at Discharge    Patient can return home with the following Two people to help with walking and/or transfers;A lot of help with bathing/dressing/bathroom;Assistance with cooking/housework;Assist for transportation;Help with stairs or ramp for entrance   Equipment Recommendations  None recommended by PT    Recommendations for Other Services       Precautions / Restrictions Precautions Precautions: Fall Precaution Comments: CAM boot air cast inflated, did not place top pice due to ill fitting and pressure on foor Required Braces or Orthoses: Other Brace Other Brace: camboot L foot Restrictions Weight Bearing Restrictions: Yes LLE Weight Bearing: Weight bearing as tolerated     Mobility  Bed Mobility                General bed mobility comments: patient declined to get OOB, assisted with repositioning in bed, noted edema of U and LE's. patient able to scoot self up in bed with extra time.    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        Exercises      General Comments        Pertinent Vitals/Pain Pain Assessment Pain Assessment: No/denies pain    Home Living  Prior Function            PT Goals (current goals can now be found in the care plan section) Progress towards PT goals: Not progressing toward goals - comment (patient did not want to mobilize)    Frequency    Min 1X/week      PT Plan Current plan remains appropriate    Co-evaluation              AM-PAC PT "6 Clicks" Mobility   Outcome Measure  Help needed turning from your back to your side while in a flat bed without using bedrails?: A Little Help needed moving from lying on your back to sitting on the side of a flat bed without using bedrails?: A Little Help needed moving to and from a bed to a chair (including a wheelchair)?: Total Help needed standing up from a chair using your arms (e.g., wheelchair or bedside chair)?: Total Help needed to walk in hospital room?: Total Help needed climbing 3-5 steps with a railing? : Total 6 Click Score: 10    End of Session   Activity Tolerance: Patient limited by fatigue Patient left: in bed;with call bell/phone within  reach;with bed alarm set Nurse Communication: Mobility status PT Visit Diagnosis: Other abnormalities of gait and mobility (R26.89);History of falling (Z91.81);Pain Pain - Right/Left: Left Pain - part of body: Ankle and joints of foot     Time: 1211-1226 PT Time Calculation (min) (ACUTE ONLY): 15 min  Charges:  $Therapeutic Activity: 8-22 mins                     Blanchard Kelch PT Acute Rehabilitation Services Office [phone] Weekend [phone]    Rada Hay 12/25/2022, 2:16 PM

## 2022-12-25 NOTE — Progress Notes (Signed)
PROGRESS NOTE  [name] UYQ:[id number] DOB: [birth year]   PCP: [name], NP  [facility]  DOA: 12/18/2022 LOS: 7  Chief complaints Chief Complaint  Patient presents with   Fall   Foot Pain     Brief Narrative / Interim history: Discussed with Dr. Hiroko61 year old M with recent diagnosis of gastric adenocarcinoma, tremor, HTN, BPH, GERD, HLD and some memory loss brought to ED after fall and left foot fracture at [facility].  He also had AKI and hyponatremia.  Since he is followed by hospice at [facility].  AKI resolved with IV fluid hydration.  Hyponatremia improved.  Orthopedic surgery recommended WBAT with CAM boot and outpatient follow-up for left foot fracture.  During the hospitalization also intermittently had diarrhea with slight melanotic stools but given his prior history this has been chronic in nature.  Previously it was decided if patient has overt bleeding then embolization would be an option.  However, his hemoglobin remained stable without overt bleeding after 1 unit of PRBC on 6/24.  He is on PPI.  Patient was initially interested in returning to ALF side of his [facility] facility with hospice.  Now patient and family interested in SNF.  TOC working on placement.  Medically stable for discharge as of 6/26.  Subjective: Seen and examined earlier this morning.  No major events overnight of this morning.  He denies pain today.  Getting ready to eat breakfast.  Objective: Vitals:   12/24/22 1433 12/24/22 2047 12/25/22 0535 12/25/22 0948  BP: 129/67 128/66 129/77 121/73  Pulse: 99 100 (!) 103 (!) 112  Resp: 16 16 18 20   Temp: 99 F (37.2 C) 99 F (37.2 C) 98.6 F (37 C)   TempSrc: Oral Oral Oral   SpO2: (!) 89% 90% 90% 91%  Weight:      Height:        Examination:  GENERAL: No apparent distress.  Nontoxic. HEENT: MMM.  Vision and hearing grossly intact.  NECK: Supple.  No apparent JVD.  RESP:  No IWOB.  Fair aeration bilaterally. CVS:  RRR. Heart  sounds normal.  ABD/GI/GU: BS+. Abd soft, NTND.  MSK/EXT:  Moves extremities.  BLE edema.  Left foot swelling with erythema.  Tender to palpation. SKIN: no apparent skin lesion or wound NEURO: Awake, alert and oriented appropriately.  Resting tremor.  No apparent focal neuro deficit. PSYCH: Appears anxious.  Procedures:  None  Microbiology summarized: None  Assessment and plan: Principal Problem:   Hyponatremia  Chest discomfort with elevated troponin: EKG shows mild changes.  Relieved with nitro.  Troponin slightly trending up 39-74.  Doubt need for invasive cardiac workup as plan is to discharge to facility with hospice.    Gastric adenocarcinoma/history of occult bleeding gastric ulcer with melena: Diagnosed April 2024 after EGD.  After extensive discussion with Dr. Mosetta Putt 11/28/2022, patient declined all treatments including immunotherapy and opted for hospice.  H&H stable after 1 unit on 6/24. Recent Labs    11/16/22 0530 11/28/22 1310 12/18/22 0842 12/18/22 0859 12/19/22 0514 12/20/22 0509 12/21/22 0224 12/22/22 0500 12/23/22 0524 12/24/22 0548  HGB 8.3* 9.9* 11.6* 9.4* 8.3* 8.1* 7.7* 7.7* 9.3* 9.6*  -Continue PPI  Hyponatremia/AKI: Likely prerenal.  Baseline about 0.9.  Resolved. Recent Labs    12/18/22 0859 12/18/22 1307 12/18/22 1613 12/18/22 2322 12/19/22 0514 12/20/22 0509 12/21/22 0224 12/22/22 0500 12/23/22 0524 12/24/22 0548  BUN 39* 37* 36* 34* 31* 23 14 10 10 10   CREATININE 2.31* 2.05* 2.06* 1.71* 1.65*  1.11 0.98 0.90 0.89 0.92     Left foot fracture: Occurred after mechanical fall at the assisted living facility.  Seen by Raechel Chute who recommended WBAT with CAM boot and outpatient follow-up with Dr. Odis Hollingshead post discharge -Pain control, bowel regimen -PT/OT recommended SNF.  TOC working on this.   Blood loss Chronic anemia-see gastric adenocarcinoma above  Peripheral neuropathy -Continue gabapentin   Depression -continue home  Wellbutrin   Controlled NIDDM-2: A1c 4.8% on 5/17.  On glipizide at home.  DM2, acceptable range -Discontinue CBG monitoring and SSI -Hold home glipizide due to risk of hypoglycemia.  Hypomagnesemia -Monitor replenish as appropriate   Physical deconditioning: -TOC working on SNF.  Resting tremor: Suspect Parkinson disease but no formal diagnosis. -Can consider outpatient follow-up with neurology based on goal of care  Body mass index is 29.02 kg/m.           DVT prophylaxis:  SCDs Start: 12/18/22 1026  Code Status: DNR/DNI Family Communication: None at bedside Level of care: Med-Surg Status is: Inpatient Remains inpatient appropriate because: Disposition   Final disposition: SNF Consultants:  Orthopedic surgery Oncology   35 minutes with more than 50% spent in reviewing records, counseling patient/family and coordinating care.   Sch Meds:  Scheduled Meds:  buPROPion  300 mg Oral Daily   ferrous sulfate  325 mg Oral Q breakfast   gabapentin  400 mg Oral BID   insulin aspart  0-15 Units Subcutaneous TID WC   insulin aspart  0-5 Units Subcutaneous QHS   loratadine  10 mg Oral Daily   LORazepam  0.5-1 mg Oral QHS   pantoprazole  40 mg Oral BID   polyethylene glycol  17 g Oral Daily   primidone  50 mg Oral BID   senna-docusate  1 tablet Oral BID   tamsulosin  0.8 mg Oral QPC supper   Continuous Infusions:  sodium chloride 100 mL/hr at 12/25/22 1000   PRN Meds:.acetaminophen **OR** acetaminophen, alum & mag hydroxide-simeth, guaiFENesin, hydrALAZINE, hydrocortisone, hydrocortisone cream, ipratropium-albuterol, lip balm, loratadine, metoprolol tartrate, morphine, morphine injection, Muscle Rub, nitroGLYCERIN, ondansetron **OR** ondansetron (ZOFRAN) IV, phenol, polyvinyl alcohol, senna-docusate, sodium chloride, traZODone  Antimicrobials: Anti-infectives (From admission, onward)    None        I have personally reviewed the following labs and  images: CBC: Recent Labs  Lab 12/20/22 0509 12/21/22 0224 12/22/22 0500 12/23/22 0524 12/24/22 0548  WBC 4.6 4.3 4.1 5.8 5.0  HGB 8.1* 7.7* 7.7* 9.3* 9.6*  HCT 25.4* 24.4* 26.7* 29.5* 30.4*  MCV 84.4 85.9 93.7 86.5 87.9  PLT 250 238 260 275 263   BMP &GFR Recent Labs  Lab 12/20/22 0509 12/20/22 1102 12/21/22 0224 12/21/22 1047 12/21/22 2048 12/22/22 0500 12/22/22 0753 12/23/22 0524 12/24/22 0548  NA 124*   < > 128*   < > 133* 133* 132* 135 132*  K 3.8  --  4.0  --   --  4.2  --  4.6 4.5  CL 91*  --  96*  --   --  102  --  103 104  CO2 26  --  26  --   --  22  --  24 22  GLUCOSE 164*  --  116*  --   --  107*  --  122* 111*  BUN 23  --  14  --   --  10  --  10 10  CREATININE 1.11  --  0.98  --   --  0.90  --  0.89 0.92  CALCIUM 8.0*  --  7.5*  --   --  7.7*  --  7.9* 7.8*  MG 1.9  --  1.9  --   --  1.8  --  1.7 1.6*   < > = values in this interval not displayed.   Estimated Creatinine Clearance: 53.8 mL/min (by C-G formula based on SCr of 0.92 mg/dL). Liver & Pancreas: No results for input(s): "AST", "ALT", "ALKPHOS", "BILITOT", "PROT", "ALBUMIN" in the last 168 hours.  No results for input(s): "LIPASE", "AMYLASE" in the last 168 hours. No results for input(s): "AMMONIA" in the last 168 hours. Diabetic: No results for input(s): "HGBA1C" in the last 72 hours. Recent Labs  Lab 12/24/22 1621 12/24/22 2049 12/24/22 2151 12/25/22 0747 12/25/22 1120  GLUCAP 99 162* 147* 91 108*   Cardiac Enzymes: No results for input(s): "CKTOTAL", "CKMB", "CKMBINDEX", "TROPONINI" in the last 168 hours. No results for input(s): "PROBNP" in the last 8760 hours. Coagulation Profile: No results for input(s): "INR", "PROTIME" in the last 168 hours. Thyroid Function Tests: No results for input(s): "TSH", "T4TOTAL", "FREET4", "T3FREE", "THYROIDAB" in the last 72 hours. Lipid Profile: No results for input(s): "CHOL", "HDL", "LDLCALC", "TRIG", "CHOLHDL", "LDLDIRECT" in the last 72  hours. Anemia Panel: No results for input(s): "VITAMINB12", "FOLATE", "FERRITIN", "TIBC", "IRON", "RETICCTPCT" in the last 72 hours. Urine analysis:    Component Value Date/Time   COLORURINE YELLOW 12/18/2022 1153   APPEARANCEUR CLEAR 12/18/2022 1153   LABSPEC 1.010 12/18/2022 1153   PHURINE 5.0 12/18/2022 1153   GLUCOSEU NEGATIVE 12/18/2022 1153   HGBUR NEGATIVE 12/18/2022 1153   BILIRUBINUR NEGATIVE 12/18/2022 1153   BILIRUBINUR neg 08/04/2011 2026   KETONESUR NEGATIVE 12/18/2022 1153   PROTEINUR NEGATIVE 12/18/2022 1153   UROBILINOGEN 0.2 03/29/2012 1438   NITRITE NEGATIVE 12/18/2022 1153   LEUKOCYTESUR NEGATIVE 12/18/2022 1153   Sepsis Labs: Invalid input(s): "PROCALCITONIN", "LACTICIDVEN"  Microbiology: No results found for this or any previous visit (from the past 240 hour(s)).  Radiology Studies: No results found.    [name] T. Khalila Buechner [facility]  If 7PM-7AM, please contact night-coverage [email or web address] 12/25/2022, 1:53 PM

## 2022-12-26 ENCOUNTER — Inpatient Hospital Stay (HOSPITAL_COMMUNITY)

## 2022-12-26 DIAGNOSIS — E871 Hypo-osmolality and hyponatremia: Secondary | ICD-10-CM | POA: Diagnosis not present

## 2022-12-26 DIAGNOSIS — I5033 Acute on chronic diastolic (congestive) heart failure: Principal | ICD-10-CM | POA: Diagnosis present

## 2022-12-26 DIAGNOSIS — R7989 Other specified abnormal findings of blood chemistry: Secondary | ICD-10-CM

## 2022-12-26 DIAGNOSIS — I5031 Acute diastolic (congestive) heart failure: Secondary | ICD-10-CM

## 2022-12-26 DIAGNOSIS — S92812A Other fracture of left foot, initial encounter for closed fracture: Secondary | ICD-10-CM | POA: Insufficient documentation

## 2022-12-26 DIAGNOSIS — N179 Acute kidney failure, unspecified: Secondary | ICD-10-CM | POA: Diagnosis present

## 2022-12-26 DIAGNOSIS — R079 Chest pain, unspecified: Secondary | ICD-10-CM

## 2022-12-26 LAB — CBC WITH DIFFERENTIAL/PLATELET
Abs Immature Granulocytes: 0.03 10*3/uL (ref 0.00–0.07)
Basophils Absolute: 0 10*3/uL (ref 0.0–0.1)
Basophils Relative: 0 %
Eosinophils Absolute: 0.1 10*3/uL (ref 0.0–0.5)
Eosinophils Relative: 2 %
HCT: 28.1 % — ABNORMAL LOW (ref 39.0–52.0)
Hemoglobin: 8.7 g/dL — ABNORMAL LOW (ref 13.0–17.0)
Immature Granulocytes: 1 %
Lymphocytes Relative: 11 %
Lymphs Abs: 0.6 10*3/uL — ABNORMAL LOW (ref 0.7–4.0)
MCH: 27.4 pg (ref 26.0–34.0)
MCHC: 31 g/dL (ref 30.0–36.0)
MCV: 88.6 fL (ref 80.0–100.0)
Monocytes Absolute: 0.6 10*3/uL (ref 0.1–1.0)
Monocytes Relative: 10 %
Neutro Abs: 4.3 10*3/uL (ref 1.7–7.7)
Neutrophils Relative %: 76 %
Platelets: 274 10*3/uL (ref 150–400)
RBC: 3.17 MIL/uL — ABNORMAL LOW (ref 4.22–5.81)
RDW: 14.8 % (ref 11.5–15.5)
WBC: 5.7 10*3/uL (ref 4.0–10.5)
nRBC: 0 % (ref 0.0–0.2)

## 2022-12-26 LAB — RENAL FUNCTION PANEL
Albumin: 1.9 g/dL — ABNORMAL LOW (ref 3.5–5.0)
Anion gap: 9 (ref 5–15)
BUN: 15 mg/dL (ref 8–23)
CO2: 19 mmol/L — ABNORMAL LOW (ref 22–32)
Calcium: 7.8 mg/dL — ABNORMAL LOW (ref 8.9–10.3)
Chloride: 105 mmol/L (ref 98–111)
Creatinine, Ser: 1.03 mg/dL (ref 0.61–1.24)
GFR, Estimated: >60 mL/min (ref >60)
Glucose, Bld: 139 mg/dL — ABNORMAL HIGH (ref 70–99)
Phosphorus: 3 mg/dL (ref 2.5–4.6)
Potassium: 4.3 mmol/L (ref 3.5–5.1)
Sodium: 133 mmol/L — ABNORMAL LOW (ref 135–145)

## 2022-12-26 LAB — BRAIN NATRIURETIC PEPTIDE: B Natriuretic Peptide: 739.2 pg/mL — ABNORMAL HIGH (ref 0.0–100.0)

## 2022-12-26 LAB — GLUCOSE, CAPILLARY: Glucose-Capillary: 127 mg/dL — ABNORMAL HIGH (ref 70–99)

## 2022-12-26 LAB — TROPONIN I (HIGH SENSITIVITY)
Troponin I (High Sensitivity): 147 ng/L (ref <18)
Troponin I (High Sensitivity): 161 ng/L (ref <18)

## 2022-12-26 LAB — MAGNESIUM: Magnesium: 1.8 mg/dL (ref 1.7–2.4)

## 2022-12-26 MED ORDER — FUROSEMIDE 10 MG/ML IJ SOLN
40.0000 mg | Freq: Once | INTRAMUSCULAR | Status: AC
  Administered 2022-12-26: 40 mg via INTRAVENOUS
  Filled 2022-12-26: qty 4

## 2022-12-26 NOTE — TOC Progression Note (Addendum)
Transition of Care [facility]) - Progression Note    Patient Details  Name: [name] MRN: [id number] Date of Birth: 04/02/1933  Transition of Care [facility]) CM/SW Contact  Adrian Prows, RN Phone Number: 12/26/2022, 11:03 AM  Clinical Narrative:    Attempted to contact pt's son [name] ([phone]) to notify him of bed offers, and discuss hospice; selected facility may have contracted agency. LVM; awaiting return call.  [phone]- pt's son [name] called back; he was given bed offers [facility] and [facility]); also discussed home hospice and notified selected facility may have contracted hospice agency; son notified list of bed offers and home hospice agencies put in brown envelope and placed on window seat in pt's room; awaiting choice; ins auth needed.   Expected Discharge Plan: Skilled Nursing Facility (vs. 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 photographer                                       Social Determinants of Health (SDOH) Interventions SDOH Screenings   Food Insecurity: No Food Insecurity (12/18/2022)  Housing: Low Risk  (12/18/2022)  Transportation Needs: No Transportation Needs (12/18/2022)  Utilities: Not At Risk (12/18/2022)  Depression (PHQ2-9): Low Risk  (10/10/2022)  Tobacco Use: Low Risk  (12/18/2022)    Readmission Risk Interventions    12/22/2022    1:00 PM  Readmission Risk Prevention Plan  Transportation Screening Complete  PCP or Specialist Appt within 3-5 Days Complete  HRI or Home Care Consult Complete  Social Work Consult for Recovery Care Planning/Counseling Complete  Palliative Care Screening Complete  Medication Review Oceanographer) Complete

## 2022-12-26 NOTE — Consult Note (Signed)
Consultation Note Date: 12/26/2022   Patient Name: [name]  DOB: [birth year]  MRN: [id number]  Age / Sex: 89 y.o., male  PCP: [name], NP Referring Physician: Almon Hercules, MD  Reason for Consultation: Establishing goals of care  HPI/Patient Profile: 89 y.o. male    admitted on 12/18/2022    Clinical Assessment and Goals of Care: 89 yo gentleman, known to PMT from previous hospitalization, life limiting diagnosis of gastric adenocarcinoma, tremor, HTN, BPH, GERD, HLD and some memory loss brought to ED after fall and left foot fracture at [facility].  He also had AKI and hyponatremia.  AKI resolved with IV fluid hydration.  Hyponatremia improved.  Orthopedic surgery recommended WBAT with CAM boot and outpatient follow-up for left foot fracture.   During the hospitalization also intermittently had diarrhea with slight melanotic stools but given his prior history this has been chronic in nature.  Previously it was decided if patient has overt bleeding then embolization would be an option.  However, his hemoglobin remained stable without overt bleeding after 1 unit of PRBC on 6/24.  He is on PPI.   Discharge options of ALF/ILF with hospice versus SNF rehab with palliative are being explored.    HCPOA  Son [name]  SUMMARY OF RECOMMENDATIONS    DNR Continue current pain regimen and non pain regimen  Appreciate TOC input regarding sorting out the most appropriate disposition option.   Code Status/Advance Care Planning: DNR   Symptom Management:   MSIR PO PRN Ativan PO PRN.   Palliative Prophylaxis:  Delirium Protocol   Psycho-social/Spiritual:  Desire for further Chaplaincy support:yes Additional Recommendations: Education on Hospice  Prognosis:  < 6 months  Discharge Planning: facility with hospice.       Primary Diagnoses: Present on Admission:  Hyponatremia   I  have reviewed the medical record, interviewed the patient and family, and examined the patient. The following aspects are pertinent.  Past Medical History:  Diagnosis Date   Anxiety    Cataract    Depression    Diabetes mellitus    GERD (gastroesophageal reflux disease)    Hyperlipemia    Hypertension    Social History   Socioeconomic History   Marital status: Widowed    Spouse name: Not on file   Number of children: 2   Years of education: Not on file   Highest education level: Doctorate  Occupational History   Occupation: retired Actuary professor    Employer: [employer]  Tobacco Use   Smoking status: Never   Smokeless tobacco: Never  Vaping Use   Vaping Use: Never used  Substance and Sexual Activity   Alcohol use: No   Drug use: No   Sexual activity: Not Currently  Other Topics Concern   Not on file  Social History Narrative   Lives in a senior center.  Has 2 children.  Retired Mudlogger at [employer].  Education; PhD.   Social Determinants of Health   Financial Resource Strain: Not on file  Food  Insecurity: No Food Insecurity (12/18/2022)   Hunger Vital Sign    Worried About Running Out of Food in the Last Year: Never true    Ran Out of Food in the Last Year: Never true  Transportation Needs: No Transportation Needs (12/18/2022)   PRAPARE - Administrator, Civil Service (Medical): No    Lack of Transportation (Non-Medical): No  Physical Activity: Not on file  Stress: Not on file  Social Connections: Not on file   Family History  Problem Relation Age of Onset   Lung cancer Brother    Colon cancer Neg Hx    Esophageal cancer Neg Hx    Rectal cancer Neg Hx    Stomach cancer Neg Hx    Scheduled Meds:  buPROPion  300 mg Oral Daily   ferrous sulfate  325 mg Oral Q breakfast   furosemide  40 mg Intravenous Once   gabapentin  400 mg Oral BID   loratadine  10 mg Oral Daily   LORazepam  0.5-1 mg Oral QHS   pantoprazole  40 mg Oral BID    polyethylene glycol  17 g Oral Daily   primidone  50 mg Oral BID   senna-docusate  1 tablet Oral BID   tamsulosin  0.8 mg Oral QPC supper   Continuous Infusions: PRN Meds:.acetaminophen **OR** acetaminophen, alum & mag hydroxide-simeth, guaiFENesin, hydrALAZINE, hydrocortisone, hydrocortisone cream, ipratropium-albuterol, lip balm, loratadine, metoprolol tartrate, morphine, morphine injection, Muscle Rub, nitroGLYCERIN, ondansetron **OR** ondansetron (ZOFRAN) IV, phenol, polyvinyl alcohol, senna-docusate, sodium chloride, traZODone Medications Prior to Admission:  Prior to Admission medications   Medication Sig Start Date End Date Taking? Authorizing Provider  acetaminophen (TYLENOL) 500 MG tablet Take 500 mg by mouth every 6 (six) hours as needed for mild pain or headache.   Yes [provider]  calcium carbonate (TUMS E-X 750) 750 MG chewable tablet Chew 1 tablet by mouth 3 (three) times daily as needed for heartburn.   Yes [provider]  fluticasone (FLONASE) 50 MCG/ACT nasal spray Place 1-2 sprays into both nostrils 2 (two) times daily as needed (for nasal congestion).   Yes [provider]  gabapentin (NEURONTIN) 800 MG tablet Take 800 mg by mouth 3 (three) times daily. 06/19/22  Yes [provider]  glipiZIDE (GLUCOTROL XL) 2.5 MG 24 hr tablet Take 2.5 mg by mouth daily with breakfast.   Yes [provider]  L-Methylfolate-Algae-B12-B6 Latrelle Dodrill) 3-90.314-2-35 MG CAPS Take 1 capsule by mouth 2 (two) times daily.   Yes [provider]  lisinopril (ZESTRIL) 2.5 MG tablet Take 2.5 mg by mouth daily.   Yes [provider]  loperamide (IMODIUM) 2 MG capsule Take 2 mg by mouth as needed for diarrhea or loose stools.   Yes [provider]  losartan (COZAAR) 25 MG tablet Take 1 tablet (25 mg total) by mouth daily. 11/18/22  Yes [name], MD  Multiple Vitamins-Minerals (CENTRUM SILVER 50+MEN) TABS Take 1 tablet by mouth  daily with breakfast.   Yes [provider]  Multiple Vitamins-Minerals (PRESERVISION AREDS 2) CAPS Take 1 capsule by mouth 2 (two) times daily with a meal.   Yes [provider]  Omega-3 Fatty Acids (FISH OIL ULTRA) 1400 MG CAPS Take 1,400 mg by mouth daily.   Yes [provider]  omeprazole (PRILOSEC) 40 MG capsule Take 40 mg by mouth daily before breakfast.   Yes [provider]  potassium chloride SA (KLOR-CON M) 20 MEQ tablet Take 1 tablet (20 mEq  total) by mouth daily. 11/17/22  Yes [name], MD  primidone (MYSOLINE) 50 MG tablet Take 50 mg by mouth 2 (two) times daily.   Yes [provider]  tamsulosin (FLOMAX) 0.4 MG CAPS capsule Take 0.8 mg by mouth at bedtime.   Yes [provider]  chlorhexidine (PERIDEX) 0.12 % solution Use as directed 15 mLs in the mouth or throat 2 (two) times daily.    [provider]  ferrous sulfate 325 (65 FE) MG EC tablet Take 1 tablet (325 mg total) by mouth daily with breakfast. 10/24/22   Heilingoetter, [name], [name]  furosemide (LASIX) 20 MG tablet Take 1 tablet (20 mg total) by mouth daily. 11/17/22   [name], MD  lidocaine (LIDODERM) 5 % Place 1 patch onto the skin daily. Remove & Discard patch within 12 hours or as directed by MD ,Apply on right knee Patient not taking: Reported on 12/18/2022 11/17/22   [name], MD  loratadine (CLARITIN) 10 MG tablet Take 10 mg by mouth daily.    [provider]  LORazepam (ATIVAN) 0.5 MG tablet Take 1-2 tablets (0.5-1 mg total) by mouth every 8 (eight) hours as needed for anxiety. Take 0.5 mg (1 tablet) by mouth every night at bedtime. May take an additional 0.5 mg (1 tablet) as needed. 12/22/22   Amin, Loura Halt, MD  morphine (MSIR) 15 MG tablet Take 1 tablet (15 mg total) by mouth every 6 (six) hours as needed for severe pain. 12/22/22   Amin, Loura Halt, MD  pantoprazole (PROTONIX) 40 MG tablet Take 1 tablet (40 mg total) by  mouth 2 (two) times daily. 11/15/22 12/18/22  [name], MD  polyethylene glycol (MIRALAX / GLYCOLAX) 17 g packet Take 17 g by mouth daily. 12/22/22   Amin, Ankit Chirag, MD  senna-docusate (SENOKOT-S) 8.6-50 MG tablet Take 1 tablet by mouth 2 (two) times daily. 11/17/22   [name], MD   No Known Allergies Review of Systems +ankle pain Physical Exam No distress Resting in bed LLE foot swelling tender Awake alert Regular work of breathing.   Vital Signs: BP (!) 153/79 (BP Location: Right Arm)   Pulse (!) 108   Temp 98.2 F (36.8 C) (Oral)   Resp 18   Ht 5\' 5"  (1.651 m)   Wt 79.1 kg   SpO2 97%   BMI 29.02 kg/m  Pain Scale: 0-10 POSS *See Group Information*: 1-Acceptable,Awake and alert Pain Score: 10-Worst pain ever   SpO2: SpO2: 97 % O2 Device:SpO2: 97 % O2 Flow Rate: .O2 Flow Rate (L/min): 3 L/min  IO: Intake/output summary:  Intake/Output Summary (Last 24 hours) at 12/26/2022 1207 Last data filed at 12/26/2022 0900 Gross per 24 hour  Intake 60 ml  Output 825 ml  Net -765 ml    LBM: Last BM Date : 12/25/22 Baseline Weight: Weight: 79.1 kg Most recent weight: Weight: 79.1 kg     Palliative Assessment/Data:   PPS 50%  Time In:  11 Time Out:  12 Time Total:  60  Greater than 50%  of this time was spent counseling and coordinating care related to the above assessment and plan.  Signed by: [name], MD   Please contact Palliative Medicine Team phone at [phone] for questions and concerns.  For individual provider: See Loretha Stapler

## 2022-12-26 NOTE — Progress Notes (Signed)
OT Cancellation Note  Patient Details Name: [name] MRN: [id number] DOB: 10/20/1932   Cancelled Treatment:    Reason Eval/Treat Not Completed: Medical issues which prohibited therapy. Nurse requested to hold therapy, as the pt was presenting with chest pain.    Reuben Likes, OTR/L 12/26/2022, 1:23 PM

## 2022-12-26 NOTE — Progress Notes (Signed)
Patient c/o of chest pain, MD came to bedside VSS, 02 applied 2L [place], nitroglycerin given x2. Chest pain resolved, see new orders. Will continue to monitor.

## 2022-12-26 NOTE — Progress Notes (Signed)
PT Cancellation Note  Patient Details Name: [name] MRN: [id number] DOB: [birth year]   Cancelled Treatment:    Reason Eval/Treat Not Completed: Medical issues which prohibited therapy RN reports pt with chest pain this morning and given morphine.  Requests to hold therapy at this time.   Janan Halter [place] 12/26/2022, 9:32 AM Paulino Door, DPT Physical Therapist Acute Rehabilitation Services Office: [phone]

## 2022-12-26 NOTE — Progress Notes (Signed)
PROGRESS NOTE  [name] ZOX:[id number] DOB: Sep 12, 1932   PCP: [name], NP  Patient is from: [facility]  DOA: 12/18/2022 LOS: 8  Chief complaints Chief Complaint  Patient presents with   Fall   Foot Pain     Brief Narrative / Interim history: Discussed with Dr. Hiroko17 year old M with recent diagnosis of gastric adenocarcinoma, tremor, HTN, BPH, GERD, HLD and some memory loss brought to ED after fall and left foot fracture at [facility]F.  He also had AKI and hyponatremia.  Since he is followed by hospice at ILF.  AKI resolved with IV fluid hydration.  Hyponatremia improved.  Orthopedic surgery recommended WBAT with CAM boot and outpatient follow-up for left foot fracture.  During the hospitalization also intermittently had diarrhea with slight melanotic stools but given his prior history this has been chronic in nature.  Previously it was decided if patient has overt bleeding then embolization would be an option.  However, his hemoglobin remained stable without overt bleeding after 1 unit of PRBC on 6/24.  He is on PPI.  Patient was initially interested in returning to ALF side of his ILF facility with hospice.  Now patient and family interested in SNF.  TOC working on placement.  Medically stable for discharge as of 6/26.  Subjective: Seen and examined earlier this morning.  Had episode of epigastric/chest pain radiating to her back earlier this morning. CXR suggested acute CHF.  Troponin elevated to 147>> 161 on repeat.  EKG EKG suggest this anterior ischemia with ST&T wave abnormality.  Unfortunately, patient is not a candidate for anticoagulation or invasive cardiac workup due to risk of bleeding.  Pain resolved after nitro, morphine GI cocktail and IV Lasix.  Hemodynamically stable.  Objective: Vitals:   12/25/22 2118 12/26/22 0518 12/26/22 0844 12/26/22 1400  BP: 132/67 (!) 149/73 (!) 153/79 (!) 145/63  Pulse: (!) 101 (!) 103 (!) 108 97  Resp: 16 18  20   Temp: 98.2 F  (36.8 C) 98.2 F (36.8 C) 98.2 F (36.8 C) 99.5 F (37.5 C)  TempSrc: Oral Oral Oral Oral  SpO2: 90% 90% 97% 93%  Weight:      Height:        Examination:  GENERAL: No apparent distress.  Nontoxic. HEENT: MMM.  Vision and hearing grossly intact.  NECK: Supple.  No apparent JVD.  RESP:  No IWOB.  Fair aeration bilaterally. CVS:  RRR. Heart sounds normal.  ABD/GI/GU: BS+. Abd soft, NTND.  MSK/EXT:  Moves extremities.  BLE edema.  Left foot swelling with erythema.  Tender to palpation. SKIN: no apparent skin lesion or wound NEURO: Awake, alert and oriented appropriately.  Resting tremor.  No apparent focal neuro deficit. PSYCH: Appears anxious.  Procedures:  None  Microbiology summarized: None  Assessment and plan: Principal Problem:   Hyponatremia  Epigastric/chest pain and elevated troponin: Patient had another episode of epigastric/chest pain the morning of 6/28.  He was on IV fluid.  Hemodynamically stable.  CXR suggested acute CHF.  BNP elevated to 700s.  Troponin elevated to 147>> 161 on repeat.  This could be demand ischemia from CHF.  EKG EKG suggest this anterior ischemia with ST&T wave abnormality.  Unfortunately, patient is not a candidate for anticoagulation or invasive cardiac workup due to risk of bleeding.  He is also on hospice care now.  Pain resolved after nitro, morphine GI cocktail and IV Lasix.   -Symptomatic management with IV Lasix, PPI, GI cocktail, nitro and morphine as needed. -Discontinue IV fluid.  Acute on chronic diastolic CHF: TTE on 5/17 with LVEF of 60 to 65%, G1 DD.  Patient was on IV fluid.  Developed epigastric/chest pain this morning.  Cardiac workup as above. -Discontinue IV fluid -IV Lasix 40 mg x 1.  Redose diuretics based on symptoms.   Gastric adenocarcinoma/history of occult bleeding gastric ulcer with melena: Diagnosed April 2024 after EGD.  After extensive discussion with Dr. Mosetta Putt 11/28/2022, patient declined all treatments including  immunotherapy and opted for hospice.  Transfused 1 unit on 6/24.  Slight drop in Hgb likely dilutional. Recent Labs    11/28/22 1310 12/18/22 0842 12/18/22 0859 12/19/22 0514 12/20/22 0509 12/21/22 0224 12/22/22 0500 12/23/22 0524 12/24/22 0548 12/26/22 1011  HGB 9.9* 11.6* 9.4* 8.3* 8.1* 7.7* 7.7* 9.3* 9.6* 8.7*  -Continue PPI  Hyponatremia/AKI: Likely prerenal.  Baseline about 0.9.  Resolved. Recent Labs    12/18/22 1307 12/18/22 1613 12/18/22 2322 12/19/22 0514 12/20/22 0509 12/21/22 0224 12/22/22 0500 12/23/22 0524 12/24/22 0548 12/26/22 1011  BUN 37* 36* 34* 31* 23 14 10 10 10 15   CREATININE 2.05* 2.06* 1.71* 1.65* 1.11 0.98 0.90 0.89 0.92 1.03  -Monitor   Left foot fracture: Occurred after mechanical fall at the assisted living facility.  Seen by Raechel Chute who recommended WBAT with CAM boot and outpatient follow-up with Dr. Odis Hollingshead post discharge -Pain control, bowel regimen -PT/OT recommended SNF.  TOC working on this.   Blood loss Chronic anemia-see gastric adenocarcinoma above  Peripheral neuropathy -Continue gabapentin   Depression -continue home Wellbutrin   Controlled NIDDM-2: A1c 4.8% on 5/17.  On glipizide at home.  DM2, acceptable range -Discontinue CBG monitoring and SSI -Hold home glipizide due to risk of hypoglycemia.  Hypomagnesemia -Monitor replenish as appropriate   Physical deconditioning: -TOC working on SNF.  Resting tremor: Suspect Parkinson disease but no formal diagnosis. -Can consider outpatient follow-up with neurology based on goal of care  Body mass index is 29.02 kg/m.           DVT prophylaxis:  SCDs Start: 12/18/22 1026  Code Status: DNR/DNI Family Communication: None at bedside Level of care: Med-Surg Status is: Inpatient Remains inpatient appropriate because: Disposition   Final disposition: SNF Consultants:  Orthopedic surgery Oncology   55 minutes with more than 50% spent in reviewing records,  counseling patient/family and coordinating care.   Sch Meds:  Scheduled Meds:  buPROPion  300 mg Oral Daily   ferrous sulfate  325 mg Oral Q breakfast   gabapentin  400 mg Oral BID   loratadine  10 mg Oral Daily   LORazepam  0.5-1 mg Oral QHS   pantoprazole  40 mg Oral BID   polyethylene glycol  17 g Oral Daily   primidone  50 mg Oral BID   senna-docusate  1 tablet Oral BID   tamsulosin  0.8 mg Oral QPC supper   Continuous Infusions:   PRN Meds:.acetaminophen **OR** acetaminophen, alum & mag hydroxide-simeth, guaiFENesin, hydrALAZINE, hydrocortisone, hydrocortisone cream, ipratropium-albuterol, lip balm, loratadine, metoprolol tartrate, morphine, morphine injection, Muscle Rub, nitroGLYCERIN, ondansetron **OR** ondansetron (ZOFRAN) IV, phenol, polyvinyl alcohol, senna-docusate, sodium chloride, traZODone  Antimicrobials: Anti-infectives (From admission, onward)    None        I have personally reviewed the following labs and images: CBC: Recent Labs  Lab 12/21/22 0224 12/22/22 0500 12/23/22 0524 12/24/22 0548 12/26/22 1011  WBC 4.3 4.1 5.8 5.0 5.7  NEUTROABS  --   --   --   --  4.3  HGB 7.7* 7.7* 9.3*  9.6* 8.7*  HCT 24.4* 26.7* 29.5* 30.4* 28.1*  MCV 85.9 93.7 86.5 87.9 88.6  PLT 238 260 275 263 274   BMP &GFR Recent Labs  Lab 12/21/22 0224 12/21/22 1047 12/22/22 0500 12/22/22 0753 12/23/22 0524 12/24/22 0548 12/26/22 1011  NA 128*   < > 133* 132* 135 132* 133*  K 4.0  --  4.2  --  4.6 4.5 4.3  CL 96*  --  102  --  103 104 105  CO2 26  --  22  --  24 22 19*  GLUCOSE 116*  --  107*  --  122* 111* 139*  BUN 14  --  10  --  10 10 15   CREATININE 0.98  --  0.90  --  0.89 0.92 1.03  CALCIUM 7.5*  --  7.7*  --  7.9* 7.8* 7.8*  MG 1.9  --  1.8  --  1.7 1.6* 1.8  PHOS  --   --   --   --   --   --  3.0   < > = values in this interval not displayed.   Estimated Creatinine Clearance: 48 mL/min (by C-G formula based on SCr of 1.03 mg/dL). Liver &  Pancreas: Recent Labs  Lab 12/26/22 1011  ALBUMIN 1.9*    No results for input(s): "LIPASE", "AMYLASE" in the last 168 hours. No results for input(s): "AMMONIA" in the last 168 hours. Diabetic: No results for input(s): "HGBA1C" in the last 72 hours. Recent Labs  Lab 12/24/22 2049 12/24/22 2151 12/25/22 0747 12/25/22 1120 12/25/22 1641  GLUCAP 162* 147* 91 108* 111*   Cardiac Enzymes: No results for input(s): "CKTOTAL", "CKMB", "CKMBINDEX", "TROPONINI" in the last 168 hours. No results for input(s): "PROBNP" in the last 8760 hours. Coagulation Profile: No results for input(s): "INR", "PROTIME" in the last 168 hours. Thyroid Function Tests: No results for input(s): "TSH", "T4TOTAL", "FREET4", "T3FREE", "THYROIDAB" in the last 72 hours. Lipid Profile: No results for input(s): "CHOL", "HDL", "LDLCALC", "TRIG", "CHOLHDL", "LDLDIRECT" in the last 72 hours. Anemia Panel: No results for input(s): "VITAMINB12", "FOLATE", "FERRITIN", "TIBC", "IRON", "RETICCTPCT" in the last 72 hours. Urine analysis:    Component Value Date/Time   COLORURINE YELLOW 12/18/2022 1153   APPEARANCEUR CLEAR 12/18/2022 1153   LABSPEC 1.010 12/18/2022 1153   PHURINE 5.0 12/18/2022 1153   GLUCOSEU NEGATIVE 12/18/2022 1153   HGBUR NEGATIVE 12/18/2022 1153   BILIRUBINUR NEGATIVE 12/18/2022 1153   BILIRUBINUR neg 08/04/2011 2026   KETONESUR NEGATIVE 12/18/2022 1153   PROTEINUR NEGATIVE 12/18/2022 1153   UROBILINOGEN 0.2 03/29/2012 1438   NITRITE NEGATIVE 12/18/2022 1153   LEUKOCYTESUR NEGATIVE 12/18/2022 1153   Sepsis Labs: Invalid input(s): "PROCALCITONIN", "LACTICIDVEN"  Microbiology: No results found for this or any previous visit (from the past 240 hour(s)).  Radiology Studies: DG Chest Port 1 View  Result Date: 12/26/2022 CLINICAL DATA:  [id number] Chest pain [id number] EXAM: PORTABLE CHEST 1 VIEW COMPARISON:  CXR 12/18/22 FINDINGS: Small left pleural effusion. Cardiomegaly. Prominent bilateral  perihilar airspace opacities, right-greater-than-left could represent changes related to pulmonary venous congestion. Right infrahilar and left retrocardiac airspace opacities could represent atelectasis, but infection is not excluded. No radiographically apparent displaced rib fracture. Visualized upper abdomen is notable for gastric and colonic gaseous distention. IMPRESSION: 1. Cardiomegaly with pulmonary venous congestion and a small left pleural effusion. 2. Right infrahilar and left retrocardiac airspace opacities could represent atelectasis, but infection is not excluded. 3. Colonic gaseous distention. Electronically Signed   By: Sandria Senter  Celine Mans M.D.   On: 12/26/2022 10:34      Rydell Wiegel T. Timmya Blazier [facility]  If 7PM-7AM, please contact night-coverage [email or web address] 12/26/2022, 3:10 PM

## 2022-12-26 NOTE — Progress Notes (Signed)
MD notified of critical lab Troponin 147

## 2022-12-27 DIAGNOSIS — I5033 Acute on chronic diastolic (congestive) heart failure: Secondary | ICD-10-CM | POA: Diagnosis not present

## 2022-12-27 DIAGNOSIS — C169 Malignant neoplasm of stomach, unspecified: Secondary | ICD-10-CM

## 2022-12-27 DIAGNOSIS — N179 Acute kidney failure, unspecified: Secondary | ICD-10-CM | POA: Diagnosis not present

## 2022-12-27 DIAGNOSIS — R7989 Other specified abnormal findings of blood chemistry: Secondary | ICD-10-CM

## 2022-12-27 DIAGNOSIS — S92302A Fracture of unspecified metatarsal bone(s), left foot, initial encounter for closed fracture: Secondary | ICD-10-CM | POA: Diagnosis present

## 2022-12-27 DIAGNOSIS — K922 Gastrointestinal hemorrhage, unspecified: Secondary | ICD-10-CM

## 2022-12-27 LAB — MAGNESIUM: Magnesium: 1.7 mg/dL (ref 1.7–2.4)

## 2022-12-27 LAB — CBC WITH DIFFERENTIAL/PLATELET
Abs Immature Granulocytes: 0.02 10*3/uL (ref 0.00–0.07)
Basophils Absolute: 0 10*3/uL (ref 0.0–0.1)
Basophils Relative: 0 %
Eosinophils Absolute: 0.1 10*3/uL (ref 0.0–0.5)
Eosinophils Relative: 1 %
HCT: 29.8 % — ABNORMAL LOW (ref 39.0–52.0)
Hemoglobin: 9.1 g/dL — ABNORMAL LOW (ref 13.0–17.0)
Immature Granulocytes: 0 %
Lymphocytes Relative: 11 %
Lymphs Abs: 0.7 10*3/uL (ref 0.7–4.0)
MCH: 26.5 pg (ref 26.0–34.0)
MCHC: 30.5 g/dL (ref 30.0–36.0)
MCV: 86.6 fL (ref 80.0–100.0)
Monocytes Absolute: 0.6 10*3/uL (ref 0.1–1.0)
Monocytes Relative: 10 %
Neutro Abs: 4.8 10*3/uL (ref 1.7–7.7)
Neutrophils Relative %: 78 %
Platelets: 307 10*3/uL (ref 150–400)
RBC: 3.44 MIL/uL — ABNORMAL LOW (ref 4.22–5.81)
RDW: 15.2 % (ref 11.5–15.5)
WBC: 6.2 10*3/uL (ref 4.0–10.5)
nRBC: 0 % (ref 0.0–0.2)

## 2022-12-27 LAB — COMPREHENSIVE METABOLIC PANEL WITH GFR
ALT: 28 U/L (ref 0–44)
AST: 27 U/L (ref 15–41)
Albumin: 2.1 g/dL — ABNORMAL LOW (ref 3.5–5.0)
Alkaline Phosphatase: 167 U/L — ABNORMAL HIGH (ref 38–126)
Anion gap: 12 (ref 5–15)
BUN: 14 mg/dL (ref 8–23)
CO2: 20 mmol/L — ABNORMAL LOW (ref 22–32)
Calcium: 8.2 mg/dL — ABNORMAL LOW (ref 8.9–10.3)
Chloride: 101 mmol/L (ref 98–111)
Creatinine, Ser: 1.06 mg/dL (ref 0.61–1.24)
GFR, Estimated: >60 mL/min (ref >60)
Glucose, Bld: 128 mg/dL — ABNORMAL HIGH (ref 70–99)
Potassium: 3.9 mmol/L (ref 3.5–5.1)
Sodium: 133 mmol/L — ABNORMAL LOW (ref 135–145)
Total Bilirubin: 0.9 mg/dL (ref 0.3–1.2)
Total Protein: 5.7 g/dL — ABNORMAL LOW (ref 6.5–8.1)

## 2022-12-27 MED ORDER — FUROSEMIDE 10 MG/ML IJ SOLN
40.0000 mg | Freq: Once | INTRAMUSCULAR | Status: AC
  Administered 2022-12-27: 40 mg via INTRAVENOUS
  Filled 2022-12-27: qty 4

## 2022-12-27 NOTE — Assessment & Plan Note (Signed)
Resolved without intervention Status post 1 unit packed red blood cell transfusion on 6/24 Hemoglobin and hematocrit currently stable Likely secondary to gastric malignancy

## 2022-12-27 NOTE — Progress Notes (Signed)
Physical Therapy Treatment Patient Details Name: [name] MRN: [id number] DOB: [birth year] Today's Date: 12/27/2022   History of Present Illness Patient is a 89 year old  male who was admitted to the hospital after a fall at his indepedent living facility resulting in a left foot fracture also found to have acute kidney injury and hyponatremia   PMH:  recent diagnosis of adenocarcinoma of stomach, BPH, HTN, GERD, HLD    PT Comments    Pt received supine in bed with NT present in room taking vitals; pt agreeable to be seen but refused OOB mobility citing intractible L foot pain and weight of CAM boot; agreeable to bed mobility and exercise. Pt required maxa+2 for supine to sit, dangled EOB for 7min30s for exercises, and returned to supine with modA+2. Pt complaining of L foot pain, R knee pain, swelling bilaterally, and that his eyes were "burning", noted edema surrounding bilateral eyes, nursing informed of all reported symptoms. Pt very pleasant and attempting interventions this date. We will continue to follow acutely.    Recommendations for follow up therapy are one component of a multi-disciplinary discharge planning process, led by the attending physician.  Recommendations may be updated based on patient status, additional functional criteria and insurance authorization.  Follow Up Recommendations  Can patient physically be transported by private vehicle: No    Assistance Recommended at Discharge Frequent or constant Supervision/Assistance  Patient can return home with the following Two people to help with walking and/or transfers;A lot of help with bathing/dressing/bathroom;Assistance with cooking/housework;Assist for transportation;Help with stairs or ramp for entrance   Equipment Recommendations  None recommended by PT    Recommendations for Other Services       Precautions / Restrictions Precautions Precautions: Fall Precaution Comments: CAM boot air cast  inflated, did not place top pice due to ill fitting and pressure on foor Required Braces or Orthoses: Other Brace Other Brace: camboot L foot Restrictions Weight Bearing Restrictions: Yes LLE Weight Bearing: Weight bearing as tolerated Other Position/Activity Restrictions: Pt is reporting acute R knee pain     Mobility  Bed Mobility Overal bed mobility: Needs Assistance Bed Mobility: Supine to Sit, Sit to Supine, Sidelying to Sit     Supine to sit: HOB elevated, Max assist, +2 for physical assistance, +2 for safety/equipment Sit to supine: Mod assist, +2 for physical assistance, +2 for safety/equipment   General bed mobility comments: patient declined to get OOB, sat EOB ~93min with decreasing level of assist required. Supine to sit MaxA+2 for trunk control and movement of BLE off bed; sit to supine pt modA+2 for lift assistance and repositioning in supine.    Transfers                   General transfer comment: Pt declined    Ambulation/Gait                   Stairs             Wheelchair Mobility    Modified Rankin (Stroke Patients Only)       Balance Overall balance assessment: History of Falls, Needs assistance Sitting-balance support: Bilateral upper extremity supported, Feet supported Sitting balance-Leahy Scale: Fair Sitting balance - Comments: Pt sat EOB for 79min30s, required modA to correct posterior lean at first but then only supoervision with BUE support.       Standing balance comment: Pt declined standing  Cognition Arousal/Alertness: Awake/alert Behavior During Therapy: WFL for tasks assessed/performed, Anxious Overall Cognitive Status: Within Functional Limits for tasks assessed                                          Exercises General Exercises - Lower Extremity Ankle Circles/Pumps: AROM, Seated, 20 reps (discontinued secondary to L foot pain) Long Arc Quad: AROM,  Both, 10 reps, Seated (Pt reporting pain in R knee, discontinued) Hip ABduction/ADduction: AROM, Both, 20 reps, Seated (no pain report.)    General Comments        Pertinent Vitals/Pain Pain Assessment Pain Assessment: 0-10 Pain Score: 10-Worst pain ever Faces Pain Scale: Hurts even more Breathing: occasional labored breathing, short period of hyperventilation Negative Vocalization: occasional moan/groan, low speech, negative/disapproving quality Facial Expression: sad, frightened, frown Body Language: tense, distressed pacing, fidgeting Consolability: distracted or reassured by voice/touch PAINAD Score: 5 Pain Location: R knee, L foot, L shoulder Pain Descriptors / Indicators: Discomfort, Grimacing Pain Intervention(s): Limited activity within patient's tolerance, Monitored during session, Repositioned    Home Living                          Prior Function            PT Goals (current goals can now be found in the care plan section) Acute Rehab PT Goals PT Goal Formulation: With patient Time For Goal Achievement: 01/04/23 Progress towards PT goals: Not progressing toward goals - comment (Pt declined OOB mobility)    Frequency    Min 1X/week      PT Plan Current plan remains appropriate    Co-evaluation              AM-PAC PT "6 Clicks" Mobility   Outcome Measure  Help needed turning from your back to your side while in a flat bed without using bedrails?: A Little Help needed moving from lying on your back to sitting on the side of a flat bed without using bedrails?: A Little Help needed moving to and from a bed to a chair (including a wheelchair)?: Total Help needed standing up from a chair using your arms (e.g., wheelchair or bedside chair)?: Total Help needed to walk in hospital room?: Total Help needed climbing 3-5 steps with a railing? : Total 6 Click Score: 10    End of Session   Activity Tolerance: Patient limited by fatigue;Patient  limited by pain Patient left: in bed;with call bell/phone within reach;with bed alarm set Nurse Communication: Mobility status PT Visit Diagnosis: Other abnormalities of gait and mobility (R26.89);History of falling (Z91.81);Pain Pain - Right/Left: Left Pain - part of body: Ankle and joints of foot     Time: 1400-1434 PT Time Calculation (min) (ACUTE ONLY): 34 min  Charges:  $Therapeutic Exercise: 8-22 mins $Therapeutic Activity: 8-22 mins                     Jamesetta Geralds, PT, DPT [facility] Department Office: [phone]   Jamesetta Geralds 12/27/2022, 2:44 PM

## 2022-12-27 NOTE — Assessment & Plan Note (Signed)
Patient to continue using cam boot with ambulation Outpatient follow-up with orthopedic surgery with Dr. Hartley Barefoot

## 2022-12-27 NOTE — Progress Notes (Signed)
PROGRESS NOTE   [name]  AVW:[id number] DOB: 1932/08/31 DOA: 12/18/2022 PCP: [name], NP   Date of Service: the patient was seen and examined on 12/27/2022  Brief Narrative:  89 y.o. male with medical history significant for recent diagnosis of adenocarcinoma of the stomach  BPH, hypertension, GERD, hyperlipidemia admitted to the hospital after mechanical fall at his nursing facility.  Upon evaluation in the emergency department patient was found to have suffered a left foot fracture.  Patient was also found to have hyponatremia and acute renal failure.  The hospitalist group was then called to assess the patient for admission to the hospital.  Orthopedic surgery was consulted and it was felt that the patient's multiple left foot fractures can be managed conservatively with a cam boot and outpatient orthopedic surgery follow-up with Dr. Odis Hollingshead in 1 week.   Early in the hospitalization patient was noted to have melena and was transfused 1 unit of packed red blood cells on 6/24.  Acute kidney injury the patient presented with resolved with intravenous volume resuscitation.  Patient was evaluated by physical reviewed and is felt the patient would benefit from being in a skilled nursing facility or assisted living facility.  Patient opted to be in an assisted living facility with palliative care.    Assessment and Plan: * Acute on chronic diastolic CHF (congestive heart failure) (HCC) Developed after patient was hydrated with intravenous isotonic fluids for acute kidney injury  clinically patient now appears to be at baseline after intravenous diuretics Now off of diuretics  Closed fracture of metatarsal of left foot Patient to continue using cam boot with ambulation Outpatient follow-up with orthopedic surgery with Dr. Hartley Barefoot   Acute upper GI bleed Resolved without intervention Status post 1 unit packed red blood cell transfusion on 6/24 Hemoglobin and  hematocrit currently stable Likely secondary to gastric malignancy  Elevated troponin level not due myocardial infarction Elevated troponin earlier in the hospitalization felt to be secondary to supply/demand mismatch from acute congestive heart failure Currently chest pain-free As needed nitroglycerin  AKI (acute kidney injury) (HCC) Creatinine has now improved to baseline  Hyponatremia Mild hyponatremia of 133 which is now essentially the patient's baseline  Gastric adenocarcinoma [facility]) Patient has declined any further attempts at curative treatment. Patient wishes to be discharged with hospice services Patient has followed up as an outpatient with Dr. Mosetta Putt     Subjective:  Patient complains of left foot pain, moderate to severe in intensity, worse with movement and improved with rest.  Physical Exam:  Vitals:   12/26/22 1400 12/26/22 2111 12/27/22 0531 12/27/22 2115  BP: (!) 145/63 133/66 (!) 154/75 (!) 145/64  Pulse: 97 92 93 (!) 101  Resp: 20 16 16 18   Temp: 99.5 F (37.5 C) 98.9 F (37.2 C) 99 F (37.2 C) 99.2 F (37.3 C)  TempSrc: Oral Oral Oral Oral  SpO2: 93% 93% 98% 91%  Weight:   87.9 kg   Height:       Constitutional: Awake alert and oriented x3, patient is in distress due to left foot pain. Skin: Notable gnosis of the l dorsum of the eft foot. Eyes: Pupils are equally reactive to light.  No evidence of scleral icterus or conjunctival pallor.  ENMT: Dry mucous membranes noted.  Posterior pharynx clear of any exudate or lesions.   Respiratory: clear to auscultation bilaterally, no wheezing, no crackles. Normal respiratory effort. No accessory muscle use.  Cardiovascular: Regular rate and rhythm, no murmurs / rubs /  gallops. No extremity edema. 2+ pedal pulses. No carotid bruits.  Abdomen: Abdomen is soft and nontender.  No evidence of intra-abdominal masses.  Positive bowel sounds noted in all quadrants.   Musculoskeletal: No joint deformity upper and  lower extremities. Good ROM, no contractures. Normal muscle tone.    Data Reviewed:  I have personally reviewed and interpreted labs, imaging.  Significant findings are   CBC: Recent Labs  Lab 12/22/22 0500 12/23/22 0524 12/24/22 0548 12/26/22 1011 12/27/22 0923  WBC 4.1 5.8 5.0 5.7 6.2  NEUTROABS  --   --   --  4.3 4.8  HGB 7.7* 9.3* 9.6* 8.7* 9.1*  HCT 26.7* 29.5* 30.4* 28.1* 29.8*  MCV 93.7 86.5 87.9 88.6 86.6  PLT 260 275 263 274 307   Basic Metabolic Panel: Recent Labs  Lab 12/22/22 0500 12/22/22 0753 12/23/22 0524 12/24/22 0548 12/26/22 1011 12/27/22 0923  NA 133* 132* 135 132* 133* 133*  K 4.2  --  4.6 4.5 4.3 3.9  CL 102  --  103 104 105 101  CO2 22  --  24 22 19* 20*  GLUCOSE 107*  --  122* 111* 139* 128*  BUN 10  --  10 10 15 14   CREATININE 0.90  --  0.89 0.92 1.03 1.06  CALCIUM 7.7*  --  7.9* 7.8* 7.8* 8.2*  MG 1.8  --  1.7 1.6* 1.8 1.7  PHOS  --   --   --   --  3.0  --    GFR: Estimated Creatinine Clearance: 49.1 mL/min (by C-G formula based on SCr of 1.06 mg/dL). Liver Function Tests: Recent Labs  Lab 12/26/22 1011 12/27/22 0923  AST  --  27  ALT  --  28  ALKPHOS  --  167*  BILITOT  --  0.9  PROT  --  5.7*  ALBUMIN 1.9* 2.1*      Code Status:  DNR.  Code status decision has been confirmed with: patient   Severity of Illness:  The appropriate patient status for this patient is INPATIENT. Inpatient status is judged to be reasonable and necessary in order to provide the required intensity of service to ensure the patient's safety. The patient's presenting symptoms, physical exam findings, and initial radiographic and laboratory data in the context of their chronic comorbidities is felt to place them at high risk for further clinical deterioration. Furthermore, it is not anticipated that the patient will be medically stable for discharge from the hospital within 2 midnights of admission.   * I certify that at the point of admission it is my  clinical judgment that the patient will require inpatient hospital care spanning beyond 2 midnights from the point of admission due to high intensity of service, high risk for further deterioration and high frequency of surveillance required.*  Time spent:  55 minutes  Author:  Marinda Elk MD  12/27/2022 11:39 PM

## 2022-12-27 NOTE — Assessment & Plan Note (Signed)
Developed after patient was hydrated with intravenous isotonic fluids for acute kidney injury  clinically patient now appears to be at baseline after intravenous diuretics Now off of diuretics

## 2022-12-27 NOTE — TOC Progression Note (Addendum)
Transition of Care [facility]) - Progression Note    Patient Details  Name: [name] MRN: [id number] Date of Birth: 29-May-1933  [facility] of [facility]) CM/SW Contact  Jason Prows, RN Phone Number: 12/27/2022, 9:03 AM  Clinical Narrative:  Attempted to contact pt's son for bed choice; LVM; awaiting return call  -[phone]- pt's son [name] called back; he accepted bed offer from [facility]; Kia at facility notified; ins auth needed Expected Discharge Plan: Skilled Nursing Facility (vs. 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 photographer                                       Social Determinants of Health (SDOH) Interventions SDOH Screenings   Food Insecurity: No Food Insecurity (12/18/2022)  Housing: Low Risk  (12/18/2022)  Transportation Needs: No Transportation Needs (12/18/2022)  Utilities: Not At Risk (12/18/2022)  Depression (PHQ2-9): Low Risk  (10/10/2022)  Tobacco Use: Low Risk  (12/18/2022)    Readmission Risk Interventions    12/22/2022    1:00 PM  Readmission Risk Prevention Plan  Transportation Screening Complete  PCP or Specialist Appt within 3-5 Days Complete  HRI or Home Care Consult Complete  Social Work Consult for Recovery Care Planning/Counseling Complete  Palliative Care Screening Complete  Medication Review Oceanographer) Complete

## 2022-12-27 NOTE — Assessment & Plan Note (Signed)
Patient has declined any further attempts at curative treatment. Patient wishes to be discharged with hospice services Patient has followed up as an outpatient with Dr. Mosetta Putt

## 2022-12-27 NOTE — Assessment & Plan Note (Signed)
Creatinine has now improved to baseline

## 2022-12-27 NOTE — Assessment & Plan Note (Signed)
Elevated troponin earlier in the hospitalization felt to be secondary to supply/demand mismatch from acute congestive heart failure Currently chest pain-free As needed nitroglycerin

## 2022-12-27 NOTE — Hospital Course (Signed)
89 y.o. male with medical history significant for recent diagnosis of adenocarcinoma of the stomach  BPH, hypertension, GERD, hyperlipidemia admitted to the hospital after mechanical fall at his nursing facility.  Upon evaluation in the emergency department patient was found to have suffered a left foot fracture.  Patient was also found to have hyponatremia and acute renal failure.  The hospitalist group was then called to assess the patient for admission to the hospital.  Orthopedic surgery was consulted and it was felt that the patient's multiple left foot fractures can be managed conservatively with a cam boot and outpatient orthopedic surgery follow-up with Dr. Odis Hollingshead in 1 week.   Early in the hospitalization patient was noted to have melena and was transfused 1 unit of packed red blood cells on 6/24.  Acute kidney injury the patient presented with resolved with intravenous volume resuscitation.  Patient was evaluated by physical reviewed and is felt the patient would benefit from being in a skilled nursing facility or assisted living facility.  Patient opted to be in an assisted living facility with palliative care.

## 2022-12-27 NOTE — Assessment & Plan Note (Signed)
Mild hyponatremia of 133 which is now essentially the patient's baseline

## 2022-12-28 DIAGNOSIS — M21962 Unspecified acquired deformity of left lower leg: Secondary | ICD-10-CM | POA: Diagnosis present

## 2022-12-28 DIAGNOSIS — K922 Gastrointestinal hemorrhage, unspecified: Secondary | ICD-10-CM

## 2022-12-28 DIAGNOSIS — K254 Chronic or unspecified gastric ulcer with hemorrhage: Secondary | ICD-10-CM | POA: Diagnosis present

## 2022-12-28 DIAGNOSIS — N179 Acute kidney failure, unspecified: Secondary | ICD-10-CM

## 2022-12-28 DIAGNOSIS — N400 Enlarged prostate without lower urinary tract symptoms: Secondary | ICD-10-CM | POA: Diagnosis present

## 2022-12-28 DIAGNOSIS — E785 Hyperlipidemia, unspecified: Secondary | ICD-10-CM | POA: Diagnosis present

## 2022-12-28 DIAGNOSIS — I110 Hypertensive heart disease with heart failure: Secondary | ICD-10-CM | POA: Diagnosis present

## 2022-12-28 DIAGNOSIS — F32A Depression, unspecified: Secondary | ICD-10-CM | POA: Diagnosis present

## 2022-12-28 DIAGNOSIS — Z801 Family history of malignant neoplasm of trachea, bronchus and lung: Secondary | ICD-10-CM | POA: Diagnosis not present

## 2022-12-28 DIAGNOSIS — I5033 Acute on chronic diastolic (congestive) heart failure: Secondary | ICD-10-CM | POA: Diagnosis not present

## 2022-12-28 DIAGNOSIS — R7989 Other specified abnormal findings of blood chemistry: Secondary | ICD-10-CM

## 2022-12-28 DIAGNOSIS — E871 Hypo-osmolality and hyponatremia: Secondary | ICD-10-CM | POA: Diagnosis present

## 2022-12-28 DIAGNOSIS — K219 Gastro-esophageal reflux disease without esophagitis: Secondary | ICD-10-CM | POA: Diagnosis present

## 2022-12-28 DIAGNOSIS — E1140 Type 2 diabetes mellitus with diabetic neuropathy, unspecified: Secondary | ICD-10-CM | POA: Diagnosis present

## 2022-12-28 DIAGNOSIS — S92302A Fracture of unspecified metatarsal bone(s), left foot, initial encounter for closed fracture: Secondary | ICD-10-CM

## 2022-12-28 DIAGNOSIS — W1830XA Fall on same level, unspecified, initial encounter: Secondary | ICD-10-CM | POA: Diagnosis present

## 2022-12-28 DIAGNOSIS — C169 Malignant neoplasm of stomach, unspecified: Secondary | ICD-10-CM | POA: Diagnosis present

## 2022-12-28 DIAGNOSIS — Z66 Do not resuscitate: Secondary | ICD-10-CM | POA: Diagnosis present

## 2022-12-28 DIAGNOSIS — E861 Hypovolemia: Secondary | ICD-10-CM | POA: Diagnosis present

## 2022-12-28 DIAGNOSIS — D500 Iron deficiency anemia secondary to blood loss (chronic): Secondary | ICD-10-CM | POA: Diagnosis present

## 2022-12-28 DIAGNOSIS — Z515 Encounter for palliative care: Secondary | ICD-10-CM | POA: Diagnosis not present

## 2022-12-28 DIAGNOSIS — K921 Melena: Secondary | ICD-10-CM | POA: Diagnosis not present

## 2022-12-28 DIAGNOSIS — Z7984 Long term (current) use of oral hypoglycemic drugs: Secondary | ICD-10-CM | POA: Diagnosis not present

## 2022-12-28 DIAGNOSIS — Z1152 Encounter for screening for COVID-19: Secondary | ICD-10-CM | POA: Diagnosis not present

## 2022-12-28 DIAGNOSIS — Y92129 Unspecified place in nursing home as the place of occurrence of the external cause: Secondary | ICD-10-CM | POA: Diagnosis not present

## 2022-12-28 DIAGNOSIS — E860 Dehydration: Secondary | ICD-10-CM | POA: Diagnosis present

## 2022-12-28 DIAGNOSIS — Z79899 Other long term (current) drug therapy: Secondary | ICD-10-CM | POA: Diagnosis not present

## 2022-12-28 LAB — CBC WITH DIFFERENTIAL/PLATELET
Abs Immature Granulocytes: 0.02 10*3/uL (ref 0.00–0.07)
Basophils Absolute: 0 10*3/uL (ref 0.0–0.1)
Basophils Relative: 0 %
Eosinophils Absolute: 0.2 10*3/uL (ref 0.0–0.5)
Eosinophils Relative: 3 %
HCT: 29.9 % — ABNORMAL LOW (ref 39.0–52.0)
Hemoglobin: 9.5 g/dL — ABNORMAL LOW (ref 13.0–17.0)
Immature Granulocytes: 0 %
Lymphocytes Relative: 14 %
Lymphs Abs: 0.7 10*3/uL (ref 0.7–4.0)
MCH: 26.9 pg (ref 26.0–34.0)
MCHC: 31.8 g/dL (ref 30.0–36.0)
MCV: 84.7 fL (ref 80.0–100.0)
Monocytes Absolute: 0.5 10*3/uL (ref 0.1–1.0)
Monocytes Relative: 10 %
Neutro Abs: 3.8 10*3/uL (ref 1.7–7.7)
Neutrophils Relative %: 73 %
Platelets: 379 10*3/uL (ref 150–400)
RBC: 3.53 MIL/uL — ABNORMAL LOW (ref 4.22–5.81)
RDW: 15 % (ref 11.5–15.5)
WBC: 5.2 10*3/uL (ref 4.0–10.5)
nRBC: 0 % (ref 0.0–0.2)

## 2022-12-28 LAB — COMPREHENSIVE METABOLIC PANEL
ALT: 94 U/L — ABNORMAL HIGH (ref 0–44)
AST: 149 U/L — ABNORMAL HIGH (ref 15–41)
Albumin: 2 g/dL — ABNORMAL LOW (ref 3.5–5.0)
Alkaline Phosphatase: 289 U/L — ABNORMAL HIGH (ref 38–126)
Anion gap: 10 (ref 5–15)
BUN: 16 mg/dL (ref 8–23)
CO2: 24 mmol/L (ref 22–32)
Calcium: 8.3 mg/dL — ABNORMAL LOW (ref 8.9–10.3)
Chloride: 99 mmol/L (ref 98–111)
Creatinine, Ser: 1.08 mg/dL (ref 0.61–1.24)
GFR, Estimated: >60 mL/min (ref >60)
Glucose, Bld: 128 mg/dL — ABNORMAL HIGH (ref 70–99)
Potassium: 3.6 mmol/L (ref 3.5–5.1)
Sodium: 133 mmol/L — ABNORMAL LOW (ref 135–145)
Total Bilirubin: 0.6 mg/dL (ref 0.3–1.2)
Total Protein: 5.7 g/dL — ABNORMAL LOW (ref 6.5–8.1)

## 2022-12-28 LAB — MAGNESIUM: Magnesium: 1.7 mg/dL (ref 1.7–2.4)

## 2022-12-28 MED ORDER — ACETAMINOPHEN 325 MG PO TABS
650.0000 mg | ORAL_TABLET | Freq: Two times a day (BID) | ORAL | Status: DC
  Administered 2022-12-29 – 2022-12-30 (×3): 650 mg via ORAL
  Filled 2022-12-28 (×4): qty 2

## 2022-12-28 NOTE — Progress Notes (Signed)
PMT no charge note. Chart reviewed, discussed with the [facility] colleague [name] Plan for skilled nursing facility rehab services with palliative.  Patient will need to be revoked from hospice services. No charge [name], MD [place] palliative

## 2022-12-28 NOTE — TOC Progression Note (Addendum)
Transition of Care [facility]) - Progression Note    Patient Details  Name: [name] MRN: [id number] Date of Birth: 10-02-1932  Transition of Care [facility]) CM/SW Contact  Adrian Prows, RN Phone Number: 12/28/2022, 9:33 AM  Clinical Narrative:    Sherron Monday w/ [place] at [facility] Transitions [facility]); she says pt managed by agency; documents faxed for SNF auth: facesheet, H&P, PT/OT evals and notes, FL2, and progress notes; awaiting ins auth.; electronic confirmation received at [phone].  -[phone]- called by Lurena Joiner at [facility]; clarification needed regarding pt has hospice; confirmed family plan is for snf w/ hospice; she explained that auth process can not proceed if pt under hospice; she says if hospice is revoked, ins Berkley Harvey will start the 1st day of the next month; Lurena Joiner says if hospice revoked today, Berkley Harvey process can be initiated as soon as tomorrow; Lurena Joiner can be contacted at [phone]; will discuss w/ pt's son; pt previously followed by [facility]  -[phone]- LVM for Trena Platt and [place]gina Jock at [facility] to discuss pt needs; awaiting return call.  -1505- spoke w/ pt's son Renae Fickle regarding snf auth; attempted to contact his POC for agency Candise Bowens [phone]; LVM; awaiting return call  -1528- spoke w/ Thayer Ohm, Triage Nurse at [facility]; she will call SW at agency to initiate process w/ pt's son; someone from agency will call this CM; this CM will update [facility] also.  -[phone]- call back by April Roberts, Liaison from [facility]; pt's son Renae Fickle placed on 3-way call; she says pt will be discharged from hospice effective today; Renae Fickle agrees to discharge from agency; contacted Hospital doctor at [facility]; she will note hospice has been revoked so Berkley Harvey can be started; she will call this CM back by 5 PM if additional info needed.  -1609- called back by [facility] at [facility]; she says documents need to be faxed again w/  start of care December 29, 2022; documents faxed to [phone].  -1800- called by Shanda Bumps at [facility]; she verified pt dis-enrolled from hospice; she says will proceed with auth process; this CM gave her [facility] office # for follow up tomorrow. Expected Discharge Plan: Skilled Nursing Facility (vs. 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 photographer                                       Social Determinants of Health (SDOH) Interventions SDOH Screenings   Food Insecurity: No Food Insecurity (12/18/2022)  Housing: Low Risk  (12/18/2022)  Transportation Needs: No Transportation Needs (12/18/2022)  Utilities: Not At Risk (12/18/2022)  Depression (PHQ2-9): Low Risk  (10/10/2022)  Tobacco Use: Low Risk  (12/18/2022)    Readmission Risk Interventions    12/22/2022    1:00 PM  Readmission Risk Prevention Plan  Transportation Screening Complete  PCP or Specialist Appt within 3-5 Days Complete  HRI or Home Care Consult Complete  Social Work Consult for Recovery Care Planning/Counseling Complete  Palliative Care Screening Complete  Medication Review Oceanographer) Complete

## 2022-12-28 NOTE — Progress Notes (Signed)
PROGRESS NOTE   [name]  WUJ:[id number] DOB: 1932-11-30 DOA: 12/18/2022 PCP: [name], NP   Date of Service: the patient was seen and examined on 12/28/2022  Brief Narrative:  89 y.o. male with medical history significant for recent diagnosis of adenocarcinoma of the stomach  BPH, hypertension, GERD, hyperlipidemia admitted to the hospital after mechanical fall at his nursing facility.  Upon evaluation in the emergency department patient was found to have suffered a left foot fracture.  Patient was also found to have hyponatremia and acute renal failure.  The hospitalist group was then called to assess the patient for admission to the hospital.  Orthopedic surgery was consulted and it was felt that the patient's multiple left foot fractures can be managed conservatively with a cam boot and outpatient orthopedic surgery follow-up with Dr. Odis Hollingshead in 1 week.   Early in the hospitalization patient was noted to have melena and was transfused 1 unit of packed red blood cells on 6/24.  Acute kidney injury the patient presented with resolved with intravenous volume resuscitation.  Patient was evaluated by physical reviewed and is felt the patient would benefit from being in a skilled nursing facility or assisted living facility.  Patient opted to be in an assisted living facility with palliative care.    Assessment and Plan: * Acute on chronic diastolic CHF (congestive heart failure) (HCC) Developed after patient was hydrated with intravenous isotonic fluids for acute kidney injury  clinically patient now appears to be at baseline after intravenous diuretics Now off of diuretics  Closed fracture of metatarsal of left foot Patient continues to complain of ongoing pain of the left foot  Placing patient on scheduled Tylenol twice daily  Obtaining vitamin D level  Per orthopedic surgery recommendations, patient to continue using cam boot with ambulation Outpatient  follow-up with orthopedic surgery with Dr. Hartley Barefoot 1 week after discharge   Acute upper GI bleed Resolved without intervention Status post 1 unit packed red blood cell transfusion on 6/24 Hemoglobin and hematocrit currently stable Likely secondary to gastric malignancy  Elevated troponin level not due myocardial infarction Elevated troponin earlier in the hospitalization felt to be secondary to supply/demand mismatch from acute congestive heart failure Currently chest pain-free As needed nitroglycerin  AKI (acute kidney injury) (HCC) Creatinine has now improved to baseline  Hyponatremia Mild hyponatremia of 133 stable and near patient's baseline. Monitoring sodium levels with serial chemistries.  Gastric adenocarcinoma [facility]) Patient has declined any further attempts at curative treatment. Patient wishes to be discharged with hospice services Patient has followed up as an outpatient with Dr. Mosetta Putt    Subjective:  Patient complains of ongoing left pain, sharp in quality, radiating proximally, moderate intensity, worse with movement.  Patient states the pain is slightly improved compared to yesterday.    Physical Exam:  Vitals:   12/28/22 1335 12/28/22 1800 12/28/22 1812 12/28/22 2057  BP: (!) 141/69   124/61  Pulse: 86   77  Resp:    18  Temp: 97.9 F (36.6 C)   98.7 F (37.1 C)  TempSrc: Oral   Oral  SpO2: 98% (!) 89% 95% 97%  Weight:      Height:       Constitutional: Awake alert and oriented x3, patient is not currently in significant distress. Skin: Notable ecchymosis of the dorsum of the eft foot. Eyes: Pupils are equally reactive to light.  No evidence of scleral icterus or conjunctival pallor.  ENMT: Dry mucous membranes noted.  Posterior pharynx  clear of any exudate or lesions.   Respiratory: clear to auscultation bilaterally, no wheezing, no crackles. Normal respiratory effort. No accessory muscle use.  Cardiovascular: Regular rate and rhythm, no murmurs /  rubs / gallops. No extremity edema. 2+ pedal pulses. No carotid bruits.  Abdomen: Abdomen is soft and nontender.  No evidence of intra-abdominal masses.  Positive bowel sounds noted in all quadrants.   Musculoskeletal: Notable significant tenderness of the left foot with associated ecchymoses of the left foot.  First and second toe of the left foot are taped together.   Data Reviewed:  I have personally reviewed and interpreted labs, imaging.  Significant findings are   CBC: Recent Labs  Lab 12/23/22 0524 12/24/22 0548 12/26/22 1011 12/27/22 0923 12/28/22 0800  WBC 5.8 5.0 5.7 6.2 5.2  NEUTROABS  --   --  4.3 4.8 3.8  HGB 9.3* 9.6* 8.7* 9.1* 9.5*  HCT 29.5* 30.4* 28.1* 29.8* 29.9*  MCV 86.5 87.9 88.6 86.6 84.7  PLT 275 263 274 307 379    Basic Metabolic Panel: Recent Labs  Lab 12/23/22 0524 12/24/22 0548 12/26/22 1011 12/27/22 0923 12/28/22 0800  NA 135 132* 133* 133* 133*  K 4.6 4.5 4.3 3.9 3.6  CL 103 104 105 101 99  CO2 24 22 19* 20* 24  GLUCOSE 122* 111* 139* 128* 128*  BUN 10 10 15 14 16   CREATININE 0.89 0.92 1.03 1.06 1.08  CALCIUM 7.9* 7.8* 7.8* 8.2* 8.3*  MG 1.7 1.6* 1.8 1.7 1.7  PHOS  --   --  3.0  --   --     GFR: Estimated Creatinine Clearance: 47.3 mL/min (by C-G formula based on SCr of 1.08 mg/dL). Liver Function Tests: Recent Labs  Lab 12/26/22 1011 12/27/22 0923 12/28/22 0800  AST  --  27 149*  ALT  --  28 94*  ALKPHOS  --  167* 289*  BILITOT  --  0.9 0.6  PROT  --  5.7* 5.7*  ALBUMIN 1.9* 2.1* 2.0*       Code Status:  DNR.  Code status decision has been confirmed with: patient   Severity of Illness:  The appropriate patient status for this patient is INPATIENT. Inpatient status is judged to be reasonable and necessary in order to provide the required intensity of service to ensure the patient's safety. The patient's presenting symptoms, physical exam findings, and initial radiographic and laboratory data in the context of their  chronic comorbidities is felt to place them at high risk for further clinical deterioration. Furthermore, it is not anticipated that the patient will be medically stable for discharge from the hospital within 2 midnights of admission.   * I certify that at the point of admission it is my clinical judgment that the patient will require inpatient hospital care spanning beyond 2 midnights from the point of admission due to high intensity of service, high risk for further deterioration and high frequency of surveillance required.*  Time spent:  38 minutes  Author:  Marinda Elk MD  12/28/2022 10:02 PM

## 2022-12-29 DIAGNOSIS — S92302A Fracture of unspecified metatarsal bone(s), left foot, initial encounter for closed fracture: Secondary | ICD-10-CM | POA: Diagnosis not present

## 2022-12-29 DIAGNOSIS — R7989 Other specified abnormal findings of blood chemistry: Secondary | ICD-10-CM | POA: Diagnosis not present

## 2022-12-29 DIAGNOSIS — K922 Gastrointestinal hemorrhage, unspecified: Secondary | ICD-10-CM | POA: Diagnosis not present

## 2022-12-29 DIAGNOSIS — N179 Acute kidney failure, unspecified: Secondary | ICD-10-CM

## 2022-12-29 DIAGNOSIS — I5033 Acute on chronic diastolic (congestive) heart failure: Secondary | ICD-10-CM | POA: Diagnosis not present

## 2022-12-29 LAB — VITAMIN D 25 HYDROXY (VIT D DEFICIENCY, FRACTURES): Vit D, 25-Hydroxy: 43.37 ng/mL (ref 30–100)

## 2022-12-29 NOTE — Progress Notes (Addendum)
PMT brief progress note. Chart reviewed, discussed with the [facility] colleague Ms. Samson Frederic.  Resting in bed BP (!) 148/75 (BP Location: Right Arm)   Pulse 97   Temp 99.5 F (37.5 C) (Oral)   Resp 18   Ht 5\' 5"  (1.651 m)   Wt 84.6 kg   SpO2 90%   BMI 31.04 kg/m  Appears with generalized weakness.   Plan for skilled nursing facility rehab services with palliative.  Patient's family has revoked hospice services. Patient likely to be sent to [facility].  Greater than 50% of this time was spent counseling and coordinating care related to the above assessment and plan.  Low MDM  [name], MD [facility]

## 2022-12-29 NOTE — Progress Notes (Incomplete)
PROGRESS NOTE   [name]  ZOX:[id number] DOB: 1933-05-09 DOA: 12/18/2022 PCP: [name], NP   Date of Service: the patient was seen and examined on 12/29/2022  Brief Narrative:  89 y.o. male with medical history significant for recent diagnosis of adenocarcinoma of the stomach  BPH, hypertension, GERD, hyperlipidemia admitted to the hospital after mechanical fall at his nursing facility.  Upon evaluation in the emergency department patient was found to have suffered a left foot fracture.  Patient was also found to have hyponatremia and acute renal failure.  The hospitalist group was then called to assess the patient for admission to the hospital.  Orthopedic surgery was consulted and it was felt that the patient's multiple left foot fractures can be managed conservatively with a cam boot and outpatient orthopedic surgery follow-up with Dr. Odis Hollingshead in 1 week.   Early in the hospitalization patient was noted to have melena and was transfused 1 unit of packed red blood cells on 6/24.  Acute kidney injury the patient presented with resolved with intravenous volume resuscitation.  Patient was evaluated by physical reviewed and is felt the patient would benefit from being in a skilled nursing facility or assisted living facility.  Patient opted to be in an assisted living facility with palliative care.    Assessment and Plan: * Acute on chronic diastolic CHF (congestive heart failure) (HCC) Developed after patient was hydrated with intravenous isotonic fluids for acute kidney injury  clinically patient now appears to be at baseline after intravenous diuretics Now off of diuretics  Closed fracture of metatarsal of left foot Patient continues to complain of ongoing pain of the left foot  Placing patient on scheduled Tylenol twice daily  Vitamin D level within normal limits Per orthopedic surgery recommends patient to continue using cam boot with ambulation Outpatient  follow-up with orthopedic surgery with Dr. Hartley Barefoot 1 week after discharge   Acute upper GI bleed Resolved without intervention early in the hospital stay Status post 1 unit packed red blood cell transfusion on 6/24 Hemoglobin and hematocrit currently stable Likely secondary to gastric malignancy Avoiding anticoagulants  Elevated troponin level not due myocardial infarction Elevated troponin earlier in the hospitalization felt to be secondary to supply/demand mismatch from acute congestive heart failure Currently chest pain-free As needed nitroglycerin  AKI (acute kidney injury) (HCC) Creatinine has now improved to baseline  Hyponatremia Mild hyponatremia of 133 continues to be stable and near patient's baseline. Monitoring sodium levels with serial chemistries.  Gastric adenocarcinoma [facility]) Patient has declined any further attempts at curative treatment. Patient has followed up as an outpatient with Dr. Mosetta Putt  BPH without obstruction/lower urinary tract symptoms Continue flomax    Subjective:  Patient complains of ongoing left pain, sharp in quality, radiating proximally, moderate intensity, worse with movement.  Patient states the pain is slightly improved compared to yesterday.    Physical Exam:  Vitals:   12/28/22 2057 12/29/22 0653 12/29/22 1450 12/29/22 2227  BP: 124/61 (!) 148/75 121/64 (!) 143/73  Pulse: 77 97 88 92  Resp: 18 18 20 18   Temp: 98.7 F (37.1 C) 99.5 F (37.5 C) 98.2 F (36.8 C) 99.5 F (37.5 C)  TempSrc: Oral Oral Oral Oral  SpO2: 97% 90% 90% (!) 89%  Weight:      Height:       Constitutional: Awake alert and oriented x3, patient is not currently in significant distress. Skin: Notable ecchymosis of the dorsum of the eft foot. Eyes: Pupils are equally reactive to  light.  No evidence of scleral icterus or conjunctival pallor.  ENMT: Dry mucous membranes noted.  Posterior pharynx clear of any exudate or lesions.   Respiratory: clear to  auscultation bilaterally, no wheezing, no crackles. Normal respiratory effort. No accessory muscle use.  Cardiovascular: Regular rate and rhythm, no murmurs / rubs / gallops. No extremity edema. 2+ pedal pulses. No carotid bruits.  Abdomen: Abdomen is soft and nontender.  No evidence of intra-abdominal masses.  Positive bowel sounds noted in all quadrants.   Musculoskeletal: Notable significant tenderness of the left foot with associated ecchymoses of the left foot.  First and second toe of the left foot are taped together.   Data Reviewed:  I have personally reviewed and interpreted labs, imaging.  Significant findings are   CBC: Recent Labs  Lab 12/23/22 0524 12/24/22 0548 12/26/22 1011 12/27/22 0923 12/28/22 0800  WBC 5.8 5.0 5.7 6.2 5.2  NEUTROABS  --   --  4.3 4.8 3.8  HGB 9.3* 9.6* 8.7* 9.1* 9.5*  HCT 29.5* 30.4* 28.1* 29.8* 29.9*  MCV 86.5 87.9 88.6 86.6 84.7  PLT 275 263 274 307 379   Basic Metabolic Panel: Recent Labs  Lab 12/23/22 0524 12/24/22 0548 12/26/22 1011 12/27/22 0923 12/28/22 0800  NA 135 132* 133* 133* 133*  K 4.6 4.5 4.3 3.9 3.6  CL 103 104 105 101 99  CO2 24 22 19* 20* 24  GLUCOSE 122* 111* 139* 128* 128*  BUN 10 10 15 14 16   CREATININE 0.89 0.92 1.03 1.06 1.08  CALCIUM 7.9* 7.8* 7.8* 8.2* 8.3*  MG 1.7 1.6* 1.8 1.7 1.7  PHOS  --   --  3.0  --   --    GFR: Estimated Creatinine Clearance: 47.3 mL/min (by C-G formula based on SCr of 1.08 mg/dL). Liver Function Tests: Recent Labs  Lab 12/26/22 1011 12/27/22 0923 12/28/22 0800  AST  --  27 149*  ALT  --  28 94*  ALKPHOS  --  167* 289*  BILITOT  --  0.9 0.6  PROT  --  5.7* 5.7*  ALBUMIN 1.9* 2.1* 2.0*      Code Status:  DNR.  Code status decision has been confirmed with: patient   Severity of Illness:  The appropriate patient status for this patient is INPATIENT. Inpatient status is judged to be reasonable and necessary in order to provide the required intensity of service to  ensure the patient's safety. The patient's presenting symptoms, physical exam findings, and initial radiographic and laboratory data in the context of their chronic comorbidities is felt to place them at high risk for further clinical deterioration. Furthermore, it is not anticipated that the patient will be medically stable for discharge from the hospital within 2 midnights of admission.   * I certify that at the point of admission it is my clinical judgment that the patient will require inpatient hospital care spanning beyond 2 midnights from the point of admission due to high intensity of service, high risk for further deterioration and high frequency of surveillance required.*  Time spent:  35 minutes  Author:  Marinda Elk MD

## 2022-12-29 NOTE — Care Management Important Message (Signed)
Important Message  Patient Details IM Letter given. Name: Zakariyah Deamer MRN: [id number] Date of Birth: 1932-11-20   Medicare Important Message Given:  Yes     [name] 12/29/2022, 9:29 AM

## 2022-12-30 DIAGNOSIS — K922 Gastrointestinal hemorrhage, unspecified: Secondary | ICD-10-CM | POA: Diagnosis not present

## 2022-12-30 DIAGNOSIS — S92302A Fracture of unspecified metatarsal bone(s), left foot, initial encounter for closed fracture: Secondary | ICD-10-CM | POA: Diagnosis not present

## 2022-12-30 DIAGNOSIS — R7989 Other specified abnormal findings of blood chemistry: Secondary | ICD-10-CM | POA: Diagnosis not present

## 2022-12-30 DIAGNOSIS — I5033 Acute on chronic diastolic (congestive) heart failure: Secondary | ICD-10-CM | POA: Diagnosis not present

## 2022-12-30 DIAGNOSIS — N400 Enlarged prostate without lower urinary tract symptoms: Secondary | ICD-10-CM

## 2022-12-30 MED ORDER — LOSARTAN POTASSIUM 25 MG PO TABS: 25.0000 mg | ORAL_TABLET | Freq: Every day | ORAL | 0 refills | Status: DC

## 2022-12-30 MED ORDER — MORPHINE SULFATE 15 MG PO TABS: 15.0000 mg | ORAL_TABLET | Freq: Four times a day (QID) | ORAL | 0 refills | Status: DC | PRN

## 2022-12-30 MED ORDER — LORAZEPAM 0.5 MG PO TABS: 0.5000 mg | ORAL_TABLET | Freq: Four times a day (QID) | ORAL | 0 refills | Status: DC | PRN

## 2022-12-30 MED ORDER — BUPROPION HCL ER (XL) 300 MG PO TB24: 300.0000 mg | ORAL_TABLET | Freq: Every day | ORAL | 6 refills | Status: AC

## 2022-12-30 NOTE — Discharge Summary (Addendum)
Physician Discharge Summary   Patient: [name] MRN: [id number] DOB: 1932/07/07  Admit date:     12/18/2022  Discharge date: 12/30/22  Discharge Physician: Marinda Elk   PCP: [name], NP   Recommendations at discharge:   Patient is DNR Patient is to wear a cam boot on the left foot whenever bearing weight until instructed otherwise by the patient's outpatient orthopedic surgeon. Patient is out of bed only with assistance using an assistive device. Patient is to follow-up with the facility provider per protocol.  Patient is to follow-up with Dr. Odis Hollingshead often with orthopedic surgery 1 week after arrival to your facility.  Please contact their office to obtain appointment if you do not have one. Patient does occasionally experience oxygen saturations at night likely due to undiagnosed sleep apnea.  Placed patient on 2 L of oxygen via nasal cannula with sleep.  Discharge Diagnoses: Principal Problem:   Acute on chronic diastolic CHF (congestive heart failure) (HCC) Active Problems:   Closed fracture of metatarsal of left foot   Acute upper GI bleed   Elevated troponin level not due myocardial infarction   AKI (acute kidney injury) (HCC)   Hyponatremia   Gastric adenocarcinoma (HCC)   BPH without obstruction/lower urinary tract symptoms   Essential hypertension, benign   Chest pain   Elevated troponin   Other fracture of left foot, initial encounter for closed fracture  Resolved Problems:   * No resolved hospital problems. *   Hospital Course: 89 y.o. male with medical history significant for recent diagnosis of adenocarcinoma of the stomach  BPH, hypertension, GERD, hyperlipidemia admitted to the hospital after mechanical fall at his nursing facility.  Upon evaluation in the emergency department patient was found to have suffered a left foot fracture.  Patient was also found to have hyponatremia and acute renal failure.  The hospitalist group was  then called to assess the patient for admission to the hospital.  Orthopedic surgery was consulted and it was felt that the patient's multiple left foot fractures can be managed conservatively with a cam boot and outpatient orthopedic surgery follow-up with Dr. Odis Hollingshead in 1 week.  To manage the patient's pain related to this patient was placed on scheduled Tylenol 650 mg twice daily.  As needed oral morphine was used for severe breakthrough pain.  Early in the hospitalization patient was noted to have melena and was transfused 1 unit of packed red blood cells on 6/24.  Melena was felt to be secondary to bleeding related to the patient's known gastric malignancy.  Symptoms were self-limiting without intervention and the patient was continued on Protonix 40 mg by mouth twice daily throughout the hospitalization which is to be continued after discharge.  Acute kidney injury the patient presented with resolved with intravenous volume resuscitation.  Patient's creatinine has returned to baseline.  Patient was evaluated by physical reviewed and is felt the patient would benefit from being in a skilled nursing facility.  Arranges were made for the patient to be discharged to skilled nursing facility on 12/30/2022 in improved and stable condition.       Pain control - [employer] Controlled Substance Reporting System database was reviewed. and patient was instructed, not to drive, operate heavy machinery, perform activities at heights, swimming or participation in water activities or provide baby-sitting services while on Pain, Sleep and Anxiety Medications; until their outpatient Physician has advised to do so again. Also recommended to not to take more than prescribed Pain, Sleep and  Anxiety Medications.   Consultants: Dr. Linna Darner with Palliative Care, Dr. Shon Baton with Orthopedic surgery Procedures performed: NOne  Disposition: Skilled nursing facility Diet recommendation:  Discharge Diet Orders (From  admission, onward)     Start     Ordered   12/30/22 0000  Diet - low sodium heart healthy        12/30/22 1535           Cardiac diet  DISCHARGE MEDICATION: Allergies as of 12/30/2022   No Known Allergies      Medication List     STOP taking these medications    furosemide 20 MG tablet Commonly known as: LASIX   glipiZIDE 2.5 MG 24 hr tablet Commonly known as: GLUCOTROL XL   lidocaine 5 % Commonly known as: LIDODERM   lisinopril 2.5 MG tablet Commonly known as: ZESTRIL   potassium chloride SA 20 MEQ tablet Commonly known as: KLOR-CON M       TAKE these medications    acetaminophen 500 MG tablet Commonly known as: TYLENOL Take 500 mg by mouth every 6 (six) hours as needed for mild pain or headache.   buPROPion 300 MG 24 hr tablet Commonly known as: WELLBUTRIN XL Take 1 tablet (300 mg total) by mouth daily.   Centrum Silver 50+Men Tabs Take 1 tablet by mouth daily with breakfast. What changed: Another medication with the same name was removed. Continue taking this medication, and follow the directions you see here.   chlorhexidine 0.12 % solution Commonly known as: PERIDEX Use as directed 15 mLs in the mouth or throat 2 (two) times daily.   ferrous sulfate 325 (65 FE) MG EC tablet Take 1 tablet (325 mg total) by mouth daily with breakfast.   Fish Oil Ultra 1400 MG Caps Take 1,400 mg by mouth daily.   fluticasone 50 MCG/ACT nasal spray Commonly known as: FLONASE Place 1-2 sprays into both nostrils 2 (two) times daily as needed (for nasal congestion).   gabapentin 800 MG tablet Commonly known as: NEURONTIN Take 800 mg by mouth 3 (three) times daily.   loperamide 2 MG capsule Commonly known as: IMODIUM Take 2 mg by mouth as needed for diarrhea or loose stools.   loratadine 10 MG tablet Commonly known as: CLARITIN Take 10 mg by mouth daily.   LORazepam 0.5 MG tablet Commonly known as: ATIVAN Take 1 tablet (0.5 mg total) by mouth every 6  (six) hours as needed for anxiety. What changed:  how much to take when to take this reasons to take this additional instructions   losartan 25 MG tablet Commonly known as: COZAAR Take 1 tablet (25 mg total) by mouth daily.   Metanx 3-90.314-2-35 MG Caps Take 1 capsule by mouth 2 (two) times daily.   morphine 15 MG tablet Commonly known as: MSIR Take 1 tablet (15 mg total) by mouth every 6 (six) hours as needed for severe pain. What changed: reasons to take this   omeprazole 40 MG capsule Commonly known as: PRILOSEC Take 40 mg by mouth daily before breakfast.   pantoprazole 40 MG tablet Commonly known as: PROTONIX Take 1 tablet (40 mg total) by mouth 2 (two) times daily.   polyethylene glycol 17 g packet Commonly known as: MIRALAX / GLYCOLAX Take 17 g by mouth daily.   primidone 50 MG tablet Commonly known as: MYSOLINE Take 50 mg by mouth 2 (two) times daily.   senna-docusate 8.6-50 MG tablet Commonly known as: Senokot-S Take 1 tablet by mouth 2 (two) times  daily.   tamsulosin 0.4 MG Caps capsule Commonly known as: FLOMAX Take 0.8 mg by mouth at bedtime.   Tums E-X 750 750 MG chewable tablet Generic drug: calcium carbonate Chew 1 tablet by mouth 3 (three) times daily as needed for heartburn.        Contact information for follow-up providers     Netta Cedars, MD. Schedule an appointment as soon as possible for a visit in 1 week(s).   Specialty: Orthopedic Surgery Why: fracture follow up Contact information: [address] [place] [address] [phone]         [name], NP Follow up in 1 week(s).   Specialty: Nurse Practitioner Contact information: [address] [place] [address] [phone]              Contact information for after-discharge care     Destination     [facility] Preferred SNF .   Service: Skilled Nursing Contact information: [address] [place]  [address] [phone]                     Discharge Exam: Ceasar Mons Weights   12/18/22 1348 12/27/22 0531 12/28/22 0717  Weight: 79.1 kg 87.9 kg 84.6 kg    Constitutional: Awake alert and oriented x3, no associated distress.   Respiratory: clear to auscultation bilaterally, no wheezing, no crackles. Normal respiratory effort. No accessory muscle use.  Cardiovascular: Regular rate and rhythm, no murmurs / rubs / gallops. No extremity edema. 2+ pedal pulses. No carotid bruits.  Abdomen: Abdomen is soft and nontender.  No evidence of intra-abdominal masses.  Positive bowel sounds noted in all quadrants.   Musculoskeletal: Pain with both passive and active range of motion of the left foot.  .     Condition at discharge: fair  The results of significant diagnostics from this hospitalization (including imaging, microbiology, ancillary and laboratory) are listed below for reference.   Imaging Studies: DG Chest Port 1 View  Result Date: 12/26/2022 CLINICAL DATA:  244010 Chest pain [id number] EXAM: PORTABLE CHEST 1 VIEW COMPARISON:  CXR 12/18/22 FINDINGS: Small left pleural effusion. Cardiomegaly. Prominent bilateral perihilar airspace opacities, right-greater-than-left could represent changes related to pulmonary venous congestion. Right infrahilar and left retrocardiac airspace opacities could represent atelectasis, but infection is not excluded. No radiographically apparent displaced rib fracture. Visualized upper abdomen is notable for gastric and colonic gaseous distention. IMPRESSION: 1. Cardiomegaly with pulmonary venous congestion and a small left pleural effusion. 2. Right infrahilar and left retrocardiac airspace opacities could represent atelectasis, but infection is not excluded. 3. Colonic gaseous distention. Electronically Signed   By: Lorenza Cambridge M.D.   On: 12/26/2022 10:34   DG Knee Right Port  Result Date: 12/23/2022 CLINICAL DATA:  Right knee pain after a fall a few days ago.  EXAM: PORTABLE RIGHT KNEE - 1-2 VIEW COMPARISON:  None Available. FINDINGS: There is diffuse decreased bone mineralization. Moderate to severe medial compartment joint space narrowing. Mild to moderate peripheral medial compartment degenerative osteophytosis. Mild patellofemoral joint space narrowing with moderate superior and mild inferior degenerative osteophytes. Small joint effusion. No acute fracture or dislocation. There are two well corticated 8 mm ossicles just posterior and superior to the proximal tibia, likely loose bodies.Moderate atherosclerotic calcifications. IMPRESSION: 1. 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 2. Small joint effusion. Electronically Signed   By: Neita Garnet M.D.   On: 12/23/2022 14:06   DG Foot 2 Views Left  Result Date:  12/18/2022 CLINICAL DATA:  Postreduction of the great toe EXAM: LEFT FOOT - 2 VIEW COMPARISON:  Foot radiograph dated June 20, 2020z FINDINGS: Postreduction films demonstrate improved anatomic alignment of the left first MTP joint. Redemonstrated multiple fractures involving the metatarsals and phalanges. Redemonstrated displaced fracture of the distal second metatarsal. Probable additional minimally displaced fracture of the distal first metatarsal which was nodule visualized on prior. Previously described distal third metatarsal fracture not well seen. Probable small fractures of the base of the second and third proximal phalanges. Redemonstrated probable lateral calcaneus fracture. Moderate degenerative changes of the midfoot. Soft tissue edema of the foot. IMPRESSION: Postreduction films demonstrate improved anatomic alignment of the left first MTP joint. Electronically Signed   By: Allegra Lai M.D.   On: 12/18/2022 11:49   CT Head Wo Contrast  Result Date: 12/18/2022 CLINICAL DATA:  Fall EXAM: CT HEAD WITHOUT CONTRAST TECHNIQUE: Contiguous axial images were obtained from the base of  the skull through the vertex without intravenous contrast. RADIATION DOSE REDUCTION: This exam was performed according to the departmental dose-optimization program which includes automated exposure control, adjustment of the mA and/or kV according to patient size and/or use of iterative reconstruction technique. COMPARISON:  CT head 11/13/2022 FINDINGS: Brain: There is no acute intracranial hemorrhage, extra-axial fluid collection, or acute infarct. Parenchymal volume loss with prominence of the ventricular system and extra-axial CSF spaces is unchanged. There is a hypodense left total convexity subdural collection measuring up to 4 mm in thickness without mass effect on the underlying brain parenchyma or midline shift, new since the prior study. No acute blood is seen within this collection. The pituitary and suprasellar region are normal. There is no mass lesion. There is no mass effect or midline shift. Vascular: There is calcification of the bilateral carotid siphons. Skull: Normal. Negative for fracture or focal lesion. Sinuses/Orbits: The imaged paranasal sinuses are clear. The imaged globes and orbits are unremarkable. Other: None. IMPRESSION: 1. No acute intracranial pathology. 2. Small left cerebral convexity subdural hygroma is new since the study from 11/23/2022. No mass effect on the brain parenchyma, midline shift, or acute blood. Electronically Signed   By: Lesia Hausen M.D.   On: 12/18/2022 09:24   DG Foot Complete Left  Result Date: 12/18/2022 CLINICAL DATA:  Trauma, fall EXAM: LEFT FOOT - COMPLETE 3+ VIEW COMPARISON:  None Available. FINDINGS: There is dislocation in first metatarsophalangeal joint. There is possible tiny avulsion adjacent to the medial aspect of head of the first metatarsal. There is displaced fracture in the distal shaft of second metatarsal with 3 mm offset in alignment of fracture fragments. There is undisplaced fracture in the distal shaft of third metatarsal. There is  small linear calcific density adjacent to the lateral aspect of anterior calcaneus suggesting recent or old avulsion. There is faint lucency in the base of proximal phalanx of the second toe. Degenerative changes are noted in intertarsal joints between the navicular and medial cuneiform. There is marked soft tissue swelling over the dorsum. IMPRESSION: Fracture dislocation is seen in the left first metatarsophalangeal joint. There is displaced fracture in the distal shaft of second metatarsal. Undisplaced fracture is seen in the distal shaft of third metatarsal. There is tiny avulsion fracture in the lateral aspect of anterior calcaneus which may be recent or old. There is faint lucency in the base of proximal phalanx of second toe which may be an artifact or suggest a nondisplaced fracture. Electronically Signed   By: Harlan Stains.D.  On: 12/18/2022 08:33   DG Ribs Unilateral W/Chest Left  Result Date: 12/18/2022 CLINICAL DATA:  Trauma, fall EXAM: LEFT RIBS AND CHEST - 3+ VIEW COMPARISON:  Chest radiograph done on 11/13/2022 FINDINGS: No displaced fractures are seen in left ribs. AP supine view of the chest is technically less than optimal to evaluate the lung fields. As far as seen, there is no evidence of large pleural effusion or pneumothorax. IMPRESSION: No displaced fracture is seen in left ribs. Less than optimal evaluation of lung fields in the chest radiograph. As far as seen, there are no focal infiltrates or pleural effusion or pneumothorax. Electronically Signed   By: Ernie Avena M.D.   On: 12/18/2022 08:28   DG Ankle Complete Left  Result Date: 12/18/2022 CLINICAL DATA:  Trauma, fall EXAM: LEFT ANKLE COMPLETE - 3+ VIEW COMPARISON:  None Available. FINDINGS: No fracture or dislocation is seen in left ankle. At the edge of the field-of-view, fracture is noted in the distal shaft of second metatarsal. This finding will be better evaluated in the images of the left foot. There is  soft tissue swelling over the dorsal aspect. Bony spurs are noted in the dorsal aspect of intertarsal and tarsometatarsal joints. IMPRESSION: Fracture is seen in the distal shaft of second metatarsal. No fracture or dislocation is seen in left ankle. Electronically Signed   By: Ernie Avena M.D.   On: 12/18/2022 08:23    Microbiology: Results for orders placed or performed during the hospital encounter of 07/29/19  Respiratory Panel by RT PCR (Flu A&B, Covid) - Nasopharyngeal Swab     Status: None   Collection Time: 07/29/19  6:25 PM   Specimen: Nasopharyngeal Swab  Result Value Ref Range Status   SARS Coronavirus 2 by RT PCR NEGATIVE NEGATIVE Final    Comment: (NOTE) SARS-CoV-2 target nucleic acids are NOT DETECTED. The SARS-CoV-2 RNA is generally detectable in upper respiratoy specimens during the acute phase of infection. The lowest concentration of SARS-CoV-2 viral copies this assay can detect is 131 copies/mL. A negative result does not preclude SARS-Cov-2 infection and should not be used as the sole basis for treatment or other patient management decisions. A negative result may occur with  improper specimen collection/handling, submission of specimen other than nasopharyngeal swab, presence of viral mutation(s) within the areas targeted by this assay, and inadequate number of viral copies (<131 copies/mL). A negative result must be combined with clinical observations, patient history, and epidemiological information. The expected result is Negative. Fact Sheet for Patients:  https://www.moore.com/ Fact Sheet for Healthcare Providers:  https://www.young.biz/ This test is not yet ap proved or cleared by the [place] FDA and  has been authorized for detection and/or diagnosis of SARS-CoV-2 by FDA under an Emergency Use Authorization (EUA). This EUA will remain  in effect (meaning this test can be used) for the duration of  the COVID-19 declaration under Section 564(b)(1) of the Act, 21 U.S.C. section 360bbb-3(b)(1), unless the authorization is terminated or revoked sooner.    Influenza A by PCR NEGATIVE NEGATIVE Final   Influenza B by PCR NEGATIVE NEGATIVE Final    Comment: (NOTE) The Xpert Xpress SARS-CoV-2/FLU/RSV assay is intended as an aid in  the diagnosis of influenza from Nasopharyngeal swab specimens and  should not be used as a sole basis for treatment. Nasal washings and  aspirates are unacceptable for Xpert Xpress SARS-CoV-2/FLU/RSV  testing. Fact Sheet for Patients: https://www.moore.com/ Fact Sheet for Healthcare Providers: https://www.young.biz/ This test is not yet approved or  cleared by the [place] and  has been authorized for detection and/or diagnosis of SARS-CoV-2 by  FDA under an Emergency Use Authorization (EUA). This EUA will remain  in effect (meaning this test can be used) for the duration of the  Covid-19 declaration under Section 564(b)(1) of the Act, 21  U.S.C. section 360bbb-3(b)(1), unless the authorization is  terminated or revoked. Performed at [facility], [address] [place], [place] [address]   Surgical pcr screen     Status: Abnormal   Collection Time: 07/30/19  2:04 AM   Specimen: Nasal Mucosa; Nasal Swab  Result Value Ref Range Status   MRSA, PCR NEGATIVE NEGATIVE Final   Staphylococcus aureus POSITIVE (A) NEGATIVE Final    Comment: (NOTE) The Xpert SA Assay (FDA approved for NASAL specimens in patients 24 years of age and older), is one component of a comprehensive surveillance program. It is not intended to diagnose infection nor to guide or monitor treatment. Performed at [facility], [address], [place], [place] [address]     Labs: CBC: Recent Labs  Lab 12/24/22 0548 12/26/22 1011 12/27/22 0923 12/28/22 0800  WBC 5.0 5.7 6.2 5.2  NEUTROABS  --  4.3  4.8 3.8  HGB 9.6* 8.7* 9.1* 9.5*  HCT 30.4* 28.1* 29.8* 29.9*  MCV 87.9 88.6 86.6 84.7  PLT 263 274 307 379   Basic Metabolic Panel: Recent Labs  Lab 12/24/22 0548 12/26/22 1011 12/27/22 0923 12/28/22 0800  NA 132* 133* 133* 133*  K 4.5 4.3 3.9 3.6  CL 104 105 101 99  CO2 22 19* 20* 24  GLUCOSE 111* 139* 128* 128*  BUN 10 15 14 16   CREATININE 0.92 1.03 1.06 1.08  CALCIUM 7.8* 7.8* 8.2* 8.3*  MG 1.6* 1.8 1.7 1.7  PHOS  --  3.0  --   --    Liver Function Tests: Recent Labs  Lab 12/26/22 1011 12/27/22 0923 12/28/22 0800  AST  --  27 149*  ALT  --  28 94*  ALKPHOS  --  167* 289*  BILITOT  --  0.9 0.6  PROT  --  5.7* 5.7*  ALBUMIN 1.9* 2.1* 2.0*   CBG: Recent Labs  Lab 12/24/22 2151 12/25/22 0747 12/25/22 1120 12/25/22 1641 12/26/22 1618  GLUCAP 147* 91 108* 111* 127*    Discharge time spent: greater than 30 minutes.  Signed: Marinda Elk, MD [facility] 12/30/2022

## 2022-12-30 NOTE — Plan of Care (Addendum)
Report given to Psychologist, educational Banker) at [facility]. Patient remains in stable condition, VSS, Pain medication administered PRN, PIV removed, wallet, phone, and charger given to patient and packed up in bag, patient resting in bed and ready for transportation. [id number] here to transport patient Problem: Education: Goal: Knowledge of General Education information will improve Description: Including pain rating scale, medication(s)/side effects and non-pharmacologic comfort measures Outcome: Progressing   Problem: Health Behavior/Discharge Planning: Goal: Ability to manage health-related needs will improve Outcome: Progressing   Problem: Clinical Measurements: Goal: Ability to maintain clinical measurements within normal limits will improve Outcome: Progressing Goal: Will remain free from infection Outcome: Progressing Goal: Diagnostic test results will improve Outcome: Progressing Goal: Respiratory complications will improve Outcome: Progressing Goal: Cardiovascular complication will be avoided Outcome: Progressing   Problem: Activity: Goal: Risk for activity intolerance will decrease Outcome: Progressing   Problem: Nutrition: Goal: Adequate nutrition will be maintained Outcome: Progressing   Problem: Coping: Goal: Level of anxiety will decrease Outcome: Progressing   Problem: Elimination: Goal: Will not experience complications related to bowel motility Outcome: Progressing Goal: Will not experience complications related to urinary retention Outcome: Progressing   Problem: Pain Managment: Goal: General experience of comfort will improve Outcome: Progressing   Problem: Safety: Goal: Ability to remain free from injury will improve Outcome: Progressing   Problem: Skin Integrity: Goal: Risk for impaired skin integrity will decrease Outcome: Progressing

## 2022-12-30 NOTE — Progress Notes (Signed)
Patient left via [facility], escorted by [facility] staff. VS taken. Patient tolerated transfer well.

## 2022-12-30 NOTE — Assessment & Plan Note (Signed)
Continue flomax  

## 2022-12-30 NOTE — Discharge Instructions (Addendum)
Patient is DNR Patient is to wear a cam boot on the left foot whenever bearing weight until instructed otherwise by the patient's outpatient orthopedic surgeon. Patient is out of bed only with assistance using an assistive device. Patient is to follow-up with the facility provider per protocol.  Patient is to follow-up with Dr. Odis Hollingshead often with orthopedic surgery 1 week after arrival to your facility.  Please contact their office to obtain appointment if you do not have one. Patient does occasionally experience oxygen saturations at night likely due to undiagnosed sleep apnea.  Placed patient on 2 L of oxygen via nasal cannula with sleep.

## 2022-12-30 NOTE — TOC Transition Note (Signed)
Transition of Care [facility]) - CM/SW Discharge Note   Patient Details  Name: [name] MRN: [id number] Date of Birth: [birth year]  Transition of Care [facility]) CM/SW Contact:  Beckie Busing, RN Phone [phone]  12/30/2022, 4:08 PM   Clinical Narrative:    CM at bedside to make patient aware that he will discharge to [facility]. D/c summary has been faxed Son Renae Fickle has been updated. Transportation has been arranged per PTAR. D/c packet is at nurses station.   Please call report to [facility]  Room 122B [phone]   Final next level of care: Skilled Nursing Facility Barriers to Discharge: No Barriers Identified   Patient Goals and CMS Choice   Choice offered to / list presented to : NA  Discharge Placement                Patient chooses bed at: [facility] Patient to be transferred to facility by: PTAR Name of family member notified: Darletta Moll Patient and family notified of of transfer: 12/30/22  Discharge Plan and Services Additional resources added to the After Visit Summary for   In-house Referral: Clinical Social Work              DME Arranged: N/A DME Agency: NA       HH Arranged: NA HH Agency: NA        Social Determinants of Health (SDOH) Interventions SDOH Screenings   Food Insecurity: No Food Insecurity (12/18/2022)  Housing: Low Risk  (12/18/2022)  Transportation Needs: No Transportation Needs (12/18/2022)  Utilities: Not At Risk (12/18/2022)  Depression (PHQ2-9): Low Risk  (10/10/2022)  Tobacco Use: Low Risk  (12/18/2022)     Readmission Risk Interventions    12/22/2022    1:00 PM  Readmission Risk Prevention Plan  Transportation Screening Complete  PCP or Specialist Appt within 3-5 Days Complete  HRI or Home Care Consult Complete  Social Work Consult for Recovery Care Planning/Counseling Complete  Palliative Care Screening Complete  Medication Review Oceanographer) Complete

## 2022-12-30 NOTE — TOC Progression Note (Addendum)
Transition of Care [facility]) - Progression Note    Patient Details  Name: [name] MRN: [id number] Date of Birth: Nov 15, 1932  Transition of Care [facility]) CM/SW Contact  Beckie Busing, RN Phone [phone]  12/30/2022, 9:54 AM  Clinical Narrative:    0900 CM called [employer] to verify if insurance Berkley Harvey has been approved. Per [employer] Berkley Harvey is still pending.   CM received call from Admissions director at [facility]. Facility has concerns that patient is not participating with therapy and refusing to participate. Per [facility] admissions director if the patient is refusing to participate once at [facility] will cut him off and the facility will not have payor source for the bill. [facility] will call patient/family to discuss this and get back with the CM.   1425 CM received message that insurance Berkley Harvey is approved. Message has been sent to MD   Expected Discharge Plan: Skilled Nursing Facility (vs. 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 photographer                                       Social Determinants of Health (SDOH) Interventions SDOH Screenings   Food Insecurity: No Food Insecurity (12/18/2022)  Housing: Low Risk  (12/18/2022)  Transportation Needs: No Transportation Needs (12/18/2022)  Utilities: Not At Risk (12/18/2022)  Depression (PHQ2-9): Low Risk  (10/10/2022)  Tobacco Use: Low Risk  (12/18/2022)    Readmission Risk Interventions    12/22/2022    1:00 PM  Readmission Risk Prevention Plan  Transportation Screening Complete  PCP or Specialist Appt within 3-5 Days Complete  HRI or Home Care Consult Complete  Social Work Consult for Recovery Care Planning/Counseling Complete  Palliative Care Screening Complete  Medication Review Oceanographer)  Complete

## 2022-12-30 NOTE — Progress Notes (Addendum)
Physical Therapy Treatment Patient Details Name: [name] MRN: [id number] DOB: 1933-05-25 Today's Date: 12/30/2022   History of Present Illness Patient is a 89 year old  male who was admitted to the hospital after a fall at his indepedent living facility resulting in a left foot fracture also found to have acute kidney injury and hyponatremia   PMH:  recent diagnosis of adenocarcinoma of stomach, BPH, HTN, GERD, HLD    PT Comments  The patient is motivated to mobilize . Patient assisted to sitting on bed edge. Patient stood at [facility] x 4, reporting right knee pain. Noted Right knee flexed, trunk flexed , extra support of 2 persons to stand and assist for more erect posture.. Attempted To take a step to recliner but unable to bear weight on RLE to allow a step, patient  reporting that the right knee was painful and felt it would not hold him. CAM/aircast on left foot. Xray of right knee 6/25 revealed degenerative joint and mild effusion .    Assistance Recommended at Discharge Frequent or constant Supervision/Assistance  If plan is discharge home, recommend the following:  Can travel by private vehicle    Two people to help with walking and/or transfers;A lot of help with bathing/dressing/bathroom;Assistance with cooking/housework;Assist for transportation;Help with stairs or ramp for entrance   No  Equipment Recommendations  None recommended by PT    Recommendations for Other Services       Precautions / Restrictions Precautions Precautions: Fall Precaution Comments: CAM boot air cast inflated, did not place top pice due to ill fitting and pressure on foor Other Brace: camboot L foot Restrictions LLE Weight Bearing: Weight bearing as tolerated     Mobility  Bed Mobility   Bed Mobility: Supine to Sit, Sit to Supine, Sidelying to Sit   Sidelying to sit: Mod assist, +2 for safety/equipment, HOB elevated Supine to sit: HOB elevated, Max assist, +2 for physical  assistance, +2 for safety/equipment Sit to supine: Mod assist, +2 for physical assistance, +2 for safety/equipment   General bed mobility comments: patient motivated to mobilize, required support of trunk to sit up on bed edge.  required max for legs and trunk to return to supine    Transfers Overall transfer level: Needs assistance Equipment used: Rolling walker (2 wheels) Transfers: Sit to/from Stand Sit to Stand: +2 physical assistance, Mod assist, +2 safety/equipment, From elevated surface           General transfer comment: patient attempted to stand x 4 at Rw, patient  reports right knee pain and does not  feel like it supports him. Attempted to take a step,  right knee buckling.    Ambulation/Gait                   Stairs             Wheelchair Mobility     Tilt Bed    Modified Rankin (Stroke Patients Only)       Balance Overall balance assessment: History of Falls, Needs assistance Sitting-balance support: Bilateral upper extremity supported, Feet supported Sitting balance-Leahy Scale: Fair Sitting balance - Comments: Pt sat EOB and supported self without external support   Standing balance support: Bilateral upper extremity supported, During functional activity, Reliant on assistive device for balance Standing balance-Leahy Scale: Poor Standing balance comment: patient stood x 4 at Rw and made effort to take a step  Cognition Arousal/Alertness: Awake/alert Behavior During Therapy: WFL for tasks assessed/performed, Anxious Overall Cognitive Status: Within Functional Limits for tasks assessed                                          Exercises      General Comments        Pertinent Vitals/Pain Pain Assessment Faces Pain Scale: Hurts little more Pain Location: right knee and right side of back, low Pain Descriptors / Indicators: Discomfort, Grimacing Pain Intervention(s): Monitored  during session, Premedicated before session    Home Living                          Prior Function            PT Goals (current goals can now be found in the care plan section) Progress towards PT goals: Progressing toward goals    Frequency    Min 1X/week      PT Plan Current plan remains appropriate    Co-evaluation              AM-PAC PT "6 Clicks" Mobility   Outcome Measure  Help needed turning from your back to your side while in a flat bed without using bedrails?: A Lot Help needed moving from lying on your back to sitting on the side of a flat bed without using bedrails?: A Lot Help needed moving to and from a bed to a chair (including a wheelchair)?: Total Help needed standing up from a chair using your arms (e.g., wheelchair or bedside chair)?: Total Help needed to walk in hospital room?: Total Help needed climbing 3-5 steps with a railing? : Total 6 Click Score: 8    End of Session Equipment Utilized During Treatment: Gait belt Activity Tolerance: Patient tolerated treatment well;Patient limited by pain Patient left: in bed;with call bell/phone within reach;with bed alarm set Nurse Communication: Mobility status PT Visit Diagnosis: Other abnormalities of gait and mobility (R26.89);History of falling (Z91.81);Pain Pain - Right/Left: Right Pain - part of body: Knee     Time: 1610-9604 PT Time Calculation (min) (ACUTE ONLY): 32 min  Charges:    $Therapeutic Activity: 23-37 mins PT General Charges $$ ACUTE PT VISIT: 1 Visit                     Blanchard Kelch PT Acute Rehabilitation Services Office [phone] Weekend pager-[phone]    Rada Hay 12/30/2022, 1:15 PM

## 2023-01-09 ENCOUNTER — Other Ambulatory Visit: Payer: Medicare HMO

## 2023-01-09 ENCOUNTER — Ambulatory Visit: Payer: Medicare HMO | Admitting: Hematology

## 2023-01-09 ENCOUNTER — Ambulatory Visit: Payer: Medicare HMO

## 2023-01-21 ENCOUNTER — Other Ambulatory Visit: Payer: Medicare HMO

## 2023-01-21 ENCOUNTER — Ambulatory Visit: Payer: Medicare HMO | Admitting: Hematology

## 2023-02-08 DIAGNOSIS — N400 Enlarged prostate without lower urinary tract symptoms: Secondary | ICD-10-CM | POA: Diagnosis present

## 2023-02-08 DIAGNOSIS — I10 Essential (primary) hypertension: Secondary | ICD-10-CM | POA: Diagnosis present

## 2023-02-08 DIAGNOSIS — C169 Malignant neoplasm of stomach, unspecified: Secondary | ICD-10-CM | POA: Diagnosis present

## 2023-02-08 DIAGNOSIS — Z66 Do not resuscitate: Secondary | ICD-10-CM | POA: Diagnosis present

## 2023-03-30 ENCOUNTER — Observation Stay (HOSPITAL_COMMUNITY)
Admission: EM | Admit: 2023-03-30 | Discharge: 2023-03-31 | Disposition: A | Discharge status: Home / Self Care | Payer: Medicare Other | Attending: Internal Medicine | Admitting: Internal Medicine

## 2023-03-30 ENCOUNTER — Other Ambulatory Visit: Payer: Self-pay

## 2023-03-30 ENCOUNTER — Encounter (HOSPITAL_COMMUNITY): Payer: Self-pay

## 2023-03-30 ENCOUNTER — Emergency Department (HOSPITAL_COMMUNITY): Payer: Medicare Other

## 2023-03-30 DIAGNOSIS — D649 Anemia, unspecified: Secondary | ICD-10-CM | POA: Diagnosis not present

## 2023-03-30 DIAGNOSIS — I110 Hypertensive heart disease with heart failure: Secondary | ICD-10-CM | POA: Diagnosis not present

## 2023-03-30 DIAGNOSIS — D500 Iron deficiency anemia secondary to blood loss (chronic): Secondary | ICD-10-CM | POA: Insufficient documentation

## 2023-03-30 DIAGNOSIS — N179 Acute kidney failure, unspecified: Secondary | ICD-10-CM | POA: Diagnosis not present

## 2023-03-30 DIAGNOSIS — K625 Hemorrhage of anus and rectum: Secondary | ICD-10-CM | POA: Diagnosis present

## 2023-03-30 DIAGNOSIS — I509 Heart failure, unspecified: Secondary | ICD-10-CM | POA: Diagnosis not present

## 2023-03-30 DIAGNOSIS — E119 Type 2 diabetes mellitus without complications: Secondary | ICD-10-CM | POA: Diagnosis not present

## 2023-03-30 DIAGNOSIS — Z79899 Other long term (current) drug therapy: Secondary | ICD-10-CM | POA: Diagnosis not present

## 2023-03-30 DIAGNOSIS — C169 Malignant neoplasm of stomach, unspecified: Secondary | ICD-10-CM | POA: Diagnosis present

## 2023-03-30 DIAGNOSIS — L89112 Pressure ulcer of right upper back, stage 2: Secondary | ICD-10-CM | POA: Diagnosis not present

## 2023-03-30 DIAGNOSIS — K922 Gastrointestinal hemorrhage, unspecified: Secondary | ICD-10-CM | POA: Diagnosis not present

## 2023-03-30 DIAGNOSIS — I10 Essential (primary) hypertension: Secondary | ICD-10-CM | POA: Diagnosis present

## 2023-03-30 DIAGNOSIS — K219 Gastro-esophageal reflux disease without esophagitis: Secondary | ICD-10-CM | POA: Diagnosis present

## 2023-03-30 DIAGNOSIS — L8990 Pressure ulcer of unspecified site, unspecified stage: Secondary | ICD-10-CM | POA: Diagnosis present

## 2023-03-30 LAB — CBC WITH DIFFERENTIAL/PLATELET
Abs Immature Granulocytes: 0.08 10*3/uL — ABNORMAL HIGH (ref 0.00–0.07)
Basophils Absolute: 0 10*3/uL (ref 0.0–0.1)
Basophils Relative: 0 %
Eosinophils Absolute: 0 10*3/uL (ref 0.0–0.5)
Eosinophils Relative: 0 %
HCT: 22.6 % — ABNORMAL LOW (ref 39.0–52.0)
Hemoglobin: 6.5 g/dL — CL (ref 13.0–17.0)
Immature Granulocytes: 1 %
Lymphocytes Relative: 6 %
Lymphs Abs: 0.8 10*3/uL (ref 0.7–4.0)
MCH: 23.2 pg — ABNORMAL LOW (ref 26.0–34.0)
MCHC: 28.8 g/dL — ABNORMAL LOW (ref 30.0–36.0)
MCV: 80.7 fL (ref 80.0–100.0)
Monocytes Absolute: 0.7 10*3/uL (ref 0.1–1.0)
Monocytes Relative: 5 %
Neutro Abs: 11.6 10*3/uL — ABNORMAL HIGH (ref 1.7–7.7)
Neutrophils Relative %: 88 %
Platelets: 583 10*3/uL — ABNORMAL HIGH (ref 150–400)
RBC: 2.8 MIL/uL — ABNORMAL LOW (ref 4.22–5.81)
RDW: 17.5 % — ABNORMAL HIGH (ref 11.5–15.5)
WBC: 13.2 10*3/uL — ABNORMAL HIGH (ref 4.0–10.5)
nRBC: 0 % (ref 0.0–0.2)

## 2023-03-30 LAB — COMPREHENSIVE METABOLIC PANEL
ALT: 10 U/L (ref 0–44)
AST: 12 U/L — ABNORMAL LOW (ref 15–41)
Albumin: 2.5 g/dL — ABNORMAL LOW (ref 3.5–5.0)
Alkaline Phosphatase: 71 U/L (ref 38–126)
Anion gap: 12 (ref 5–15)
BUN: 39 mg/dL — ABNORMAL HIGH (ref 8–23)
CO2: 31 mmol/L (ref 22–32)
Calcium: 8.5 mg/dL — ABNORMAL LOW (ref 8.9–10.3)
Chloride: 93 mmol/L — ABNORMAL LOW (ref 98–111)
Creatinine, Ser: 1.48 mg/dL — ABNORMAL HIGH (ref 0.61–1.24)
GFR, Estimated: 45 mL/min — ABNORMAL LOW (ref >60)
Glucose, Bld: 247 mg/dL — ABNORMAL HIGH (ref 70–99)
Potassium: 4.4 mmol/L (ref 3.5–5.1)
Sodium: 136 mmol/L (ref 135–145)
Total Bilirubin: 0.3 mg/dL (ref 0.3–1.2)
Total Protein: 5.6 g/dL — ABNORMAL LOW (ref 6.5–8.1)

## 2023-03-30 LAB — I-STAT CG4 LACTIC ACID, ED
Lactic Acid, Venous: 2.8 mmol/L (ref 0.5–1.9)
Lactic Acid, Venous: 2 mmol/L (ref 0.5–1.9)

## 2023-03-30 LAB — PREPARE RBC (CROSSMATCH)

## 2023-03-30 LAB — POC OCCULT BLOOD, ED: Fecal Occult Bld: POSITIVE — AB

## 2023-03-30 LAB — CBG MONITORING, ED: Glucose-Capillary: 219 mg/dL — ABNORMAL HIGH (ref 70–99)

## 2023-03-30 LAB — LIPASE, BLOOD: Lipase: 23 U/L (ref 11–51)

## 2023-03-30 MED ORDER — TAMSULOSIN HCL 0.4 MG PO CAPS
0.8000 mg | ORAL_CAPSULE | Freq: Every day | ORAL | Status: DC
  Administered 2023-03-30: 0.8 mg via ORAL
  Filled 2023-03-30: qty 2

## 2023-03-30 MED ORDER — TRAZODONE HCL 50 MG PO TABS: 25.0000 mg | ORAL_TABLET | Freq: Every evening | ORAL | Status: DC | PRN

## 2023-03-30 MED ORDER — SUCRALFATE 1 GM/10ML PO SUSP
1.0000 g | Freq: Three times a day (TID) | ORAL | Status: DC
  Administered 2023-03-30 – 2023-03-31 (×4): 1 g via ORAL
  Filled 2023-03-30 (×4): qty 10

## 2023-03-30 MED ORDER — SODIUM CHLORIDE 0.9% IV SOLUTION: Freq: Once | INTRAVENOUS | Status: AC

## 2023-03-30 MED ORDER — ONDANSETRON HCL 4 MG/2ML IJ SOLN
4.0000 mg | Freq: Once | INTRAMUSCULAR | Status: AC
  Administered 2023-03-30: 4 mg via INTRAVENOUS
  Filled 2023-03-30: qty 2

## 2023-03-30 MED ORDER — PANTOPRAZOLE SODIUM 40 MG IV SOLR
40.0000 mg | INTRAVENOUS | Status: DC
  Administered 2023-03-31: 40 mg via INTRAVENOUS
  Filled 2023-03-30: qty 10

## 2023-03-30 MED ORDER — ONDANSETRON HCL 4 MG PO TABS: 4.0000 mg | ORAL_TABLET | Freq: Four times a day (QID) | ORAL | Status: DC | PRN

## 2023-03-30 MED ORDER — LACTATED RINGERS IV BOLUS
1000.0000 mL | Freq: Once | INTRAVENOUS | Status: AC
  Administered 2023-03-30: 1000 mL via INTRAVENOUS

## 2023-03-30 MED ORDER — PANTOPRAZOLE SODIUM 40 MG IV SOLR
40.0000 mg | Freq: Once | INTRAVENOUS | Status: AC
  Administered 2023-03-30: 40 mg via INTRAVENOUS
  Filled 2023-03-30: qty 10

## 2023-03-30 MED ORDER — ONDANSETRON HCL 4 MG/2ML IJ SOLN: 4.0000 mg | Freq: Four times a day (QID) | INTRAMUSCULAR | Status: DC | PRN

## 2023-03-30 MED ORDER — CALCIUM CARBONATE ANTACID 750 MG PO CHEW: 1.0000 | CHEWABLE_TABLET | Freq: Three times a day (TID) | ORAL | Status: DC | PRN

## 2023-03-30 MED ORDER — LORAZEPAM 0.5 MG PO TABS: 0.5000 mg | ORAL_TABLET | Freq: Four times a day (QID) | ORAL | Status: DC | PRN

## 2023-03-30 MED ORDER — MORPHINE SULFATE 15 MG PO TABS: 15.0000 mg | ORAL_TABLET | Freq: Four times a day (QID) | ORAL | Status: DC | PRN

## 2023-03-30 MED ORDER — BUPROPION HCL ER (XL) 300 MG PO TB24
300.0000 mg | ORAL_TABLET | Freq: Every day | ORAL | Status: DC
  Administered 2023-03-31: 300 mg via ORAL
  Filled 2023-03-30: qty 1

## 2023-03-30 MED ORDER — LACTATED RINGERS IV SOLN: INTRAVENOUS | Status: DC

## 2023-03-30 MED ORDER — ACETAMINOPHEN 325 MG PO TABS: 650.0000 mg | ORAL_TABLET | Freq: Four times a day (QID) | ORAL | Status: DC | PRN

## 2023-03-30 MED ORDER — ACETAMINOPHEN 650 MG RE SUPP: 650.0000 mg | Freq: Four times a day (QID) | RECTAL | Status: DC | PRN

## 2023-03-30 MED ORDER — CALCIUM CARBONATE ANTACID 500 MG PO CHEW: 2.0000 | CHEWABLE_TABLET | Freq: Three times a day (TID) | ORAL | Status: DC | PRN

## 2023-03-30 MED ORDER — ALBUTEROL SULFATE (2.5 MG/3ML) 0.083% IN NEBU: 2.5000 mg | INHALATION_SOLUTION | RESPIRATORY_TRACT | Status: DC | PRN

## 2023-03-30 NOTE — ED Notes (Signed)
ED TO INPATIENT HANDOFF REPORT  Name/Age/Gender [name] 89 y.o. male  Code Status    Code Status Orders  (From admission, onward)           Start     Ordered   03/30/23 0900  Do not attempt resuscitation (DNR)- Limited -Do Not Intubate (DNI)  Continuous       Question Answer Comment  If pulseless and not breathing No CPR or chest compressions.   In Pre-Arrest Conditions (Patient Is Breathing and Has A Pulse) Do not intubate. Provide all appropriate non-invasive medical interventions. Avoid ICU transfer unless indicated or required.   Consent: Discussion documented in EHR or advanced directives reviewed      03/30/23 0859           Code Status History     Date Active Date Inactive Code Status Order ID Comments User Context   12/18/2022 1026 12/31/2022 0021 DNR [id number]  Maryln Gottron, MD ED   11/14/2022 1632 11/17/2022 2031 DNR [id number]  [name], MD Inpatient   11/13/2022 2114 11/14/2022 1632 Full Code [id number]  [name], MD ED   07/29/2019 2105 08/02/2019 2354 Full Code [id number]  [name], [name], MD ED       Home/SNF/Other Nursing Home  Chief Complaint Upper GI bleed [K92.2]  Level of Care/Admitting Diagnosis ED Disposition     ED Disposition  Admit   Condition  --   Comment  Hospital Area: [facility] [[id number]]  Level of Care: Progressive [102]  Admit to Progressive based on following criteria: GI, ENDOCRINE disease patients with GI bleeding, acute liver failure or pancreatitis, stable with diabetic ketoacidosis or thyrotoxicosis (hypothyroid) state.  May admit patient to [facility] or [facility] if equivalent level of care is available:: Yes  Covid Evaluation: Asymptomatic - no recent exposure (last 10 days) testing not required  Diagnosis: Upper GI bleed [267195]  Admitting Physician: Maryln Gottron [[id number]]  Attending Physician: Kirby Crigler, Parks Neptune [[id number]]  Certification:: I certify this patient  will need inpatient services for at least 2 midnights  Expected Medical Readiness: 04/01/2023          Medical History Past Medical History:  Diagnosis Date   Anxiety    Cataract    Depression    Diabetes mellitus    GERD (gastroesophageal reflux disease)    Hyperlipemia    Hypertension     Allergies No Known Allergies  IV Location/Drains/Wounds Patient Lines/Drains/Airways Status     Active Line/Drains/Airways     Name Placement date Placement time Site Days   Peripheral IV 03/30/23 20 G Right Antecubital 03/30/23  0751  Antecubital  less than 1   Peripheral IV 03/30/23 24 G Left;Posterior Hand 03/30/23  0751  Hand  less than 1   Peripheral IV 03/30/23 20 G 1.88" Anterior;Left;Proximal Forearm 03/30/23  1035  Forearm  less than 1   Incision - 4 Ports Abdomen 1: Umbilicus;Upper 2: Superior;Umbilicus 3: Right;Medial 4: Right;Lateral 07/30/19  1020  -- 1339   Wound / Incision (Open or Dehisced) 12/18/22 Laceration Toe (Comment  which one) Anterior;Left from fall 12/18/22  0800  Toe (Comment  which one)  102            Labs/Imaging Results for orders placed or performed during the hospital encounter of 03/30/23 (from the past 48 hour(s))  CBG monitoring, ED     Status: Abnormal   Collection Time: 03/30/23  7:35 AM  Result Value Ref Range  Glucose-Capillary 219 (H) 70 - 99 mg/dL    Comment: Glucose reference range applies only to samples taken after fasting for at least 8 hours.  CBC with Differential/Platelet     Status: Abnormal   Collection Time: 03/30/23  7:35 AM  Result Value Ref Range   WBC 13.2 (H) 4.0 - 10.5 K/uL   RBC 2.80 (L) 4.22 - 5.81 MIL/uL   Hemoglobin 6.5 (LL) 13.0 - 17.0 g/dL    Comment: REPEATED TO VERIFY THIS CRITICAL RESULT HAS VERIFIED AND BEEN CALLED TO Rayvon Char RN @ [phone] ON [id number] CAL BY LINDSAY,CELESTE ON 09 30 2024 AT 0806, AND HAS BEEN READ BACK.     HCT 22.6 (L) 39.0 - 52.0 %   MCV 80.7 80.0 - 100.0 fL   MCH 23.2 (L) 26.0 - 34.0 pg    MCHC 28.8 (L) 30.0 - 36.0 g/dL   RDW 81.1 (H) 91.4 - 78.2 %   Platelets 583 (H) 150 - 400 K/uL   nRBC 0.0 0.0 - 0.2 %   Neutrophils Relative % 88 %   Neutro Abs 11.6 (H) 1.7 - 7.7 K/uL   Lymphocytes Relative 6 %   Lymphs Abs 0.8 0.7 - 4.0 K/uL   Monocytes Relative 5 %   Monocytes Absolute 0.7 0.1 - 1.0 K/uL   Eosinophils Relative 0 %   Eosinophils Absolute 0.0 0.0 - 0.5 K/uL   Basophils Relative 0 %   Basophils Absolute 0.0 0.0 - 0.1 K/uL   Immature Granulocytes 1 %   Abs Immature Granulocytes 0.08 (H) 0.00 - 0.07 K/uL    Comment: Performed at [facility], [address] [place], [place] [address]  Comprehensive metabolic panel     Status: Abnormal   Collection Time: 03/30/23  7:35 AM  Result Value Ref Range   Sodium 136 135 - 145 mmol/L   Potassium 4.4 3.5 - 5.1 mmol/L   Chloride 93 (L) 98 - 111 mmol/L   CO2 31 22 - 32 mmol/L   Glucose, Bld 247 (H) 70 - 99 mg/dL    Comment: Glucose reference range applies only to samples taken after fasting for at least 8 hours.   BUN 39 (H) 8 - 23 mg/dL   Creatinine, Ser 3.08 (H) 0.61 - 1.24 mg/dL   Calcium 8.5 (L) 8.9 - 10.3 mg/dL   Total Protein 5.6 (L) 6.5 - 8.1 g/dL   Albumin 2.5 (L) 3.5 - 5.0 g/dL   AST 12 (L) 15 - 41 U/L   ALT 10 0 - 44 U/L   Alkaline Phosphatase 71 38 - 126 U/L   Total Bilirubin 0.3 0.3 - 1.2 mg/dL   GFR, Estimated 45 (L) >60 mL/min    Comment: (NOTE) Calculated using the CKD-EPI Creatinine Equation (2021)    Anion gap 12 5 - 15    Comment: Performed at [facility], [address], [place], [place] [address]  Lipase, blood     Status: None   Collection Time: 03/30/23  7:35 AM  Result Value Ref Range   Lipase 23 11 - 51 U/L    Comment: Performed at [facility], [address] [place], [place] [address]  Type and screen     Status: None (Preliminary result)   Collection Time: 03/30/23  7:35 AM  Result Value Ref Range   ABO/RH(D) A POS    Antibody  Screen NEG    Sample Expiration 04/02/2023,2359    Unit Number [id number]  Blood Component Type RED CELLS,LR    Unit division 00    Status of Unit ISSUED    Transfusion Status OK TO TRANSFUSE    Crossmatch Result Compatible    Unit Number [id number]    Blood Component Type RED CELLS,LR    Unit division 00    Status of Unit ISSUED    Transfusion Status OK TO TRANSFUSE    Crossmatch Result      Compatible Performed at [facility], [address], [place], [place] [address]   POC occult blood, ED     Status: Abnormal   Collection Time: 03/30/23  7:49 AM  Result Value Ref Range   Fecal Occult Bld POSITIVE (A) NEGATIVE  I-Stat CG4 Lactic Acid     Status: Abnormal   Collection Time: 03/30/23  7:57 AM  Result Value Ref Range   Lactic Acid, Venous 2.8 (HH) 0.5 - 1.9 mmol/L   Comment NOTIFIED PHYSICIAN   Prepare RBC (crossmatch)     Status: None   Collection Time: 03/30/23  8:22 AM  Result Value Ref Range   Order Confirmation      ORDER PROCESSED BY BLOOD BANK Performed at [facility], [address] [place], [place] [address]   I-Stat CG4 Lactic Acid     Status: Abnormal   Collection Time: 03/30/23 10:28 AM  Result Value Ref Range   Lactic Acid, Venous 2.0 (HH) 0.5 - 1.9 mmol/L   Comment NOTIFIED PHYSICIAN    DG Chest Port 1 View  Result Date: 03/30/2023 CLINICAL DATA:  Vomiting EXAM: PORTABLE CHEST 1 VIEW COMPARISON:  Chest radiograph dated 12/26/2022 FINDINGS: Low lung volumes with bronchovascular crowding. Left basilar linear and dense opacities. Right basilar patchy opacities. No pleural effusion or pneumothorax. The heart size and mediastinal contours are within normal limits. No acute osseous abnormality. IMPRESSION: Low lung volumes with bibasilar opacities, likely atelectasis. Electronically Signed   By: Agustin Cree M.D.   On: 03/30/2023 08:57    Pending Labs Unresulted Labs (From admission, onward)     Start     Ordered    03/31/23 0500  Basic metabolic panel  Tomorrow morning,   R        03/30/23 0859   03/31/23 0500  CBC  Tomorrow morning,   R        03/30/23 0859   03/30/23 0747  Urinalysis, w/ Reflex to Culture (Infection Suspected) -Urine, Clean Catch  Once,   URGENT       Question:  Specimen Source  Answer:  Urine, Clean Catch   03/30/23 0746            Vitals/Pain Today's Vitals   03/30/23 1330 03/30/23 1345 03/30/23 1400 03/30/23 1415  BP: (!) 146/77 (!) 141/71 (!) 146/78 128/72  Pulse: (!) 109 (!) 109 (!) 111 (!) 109  Resp: 18 20 16 18   Temp:    98.1 F (36.7 C)  TempSrc:    Oral  SpO2: 99% 99% 97%   Weight:      Height:      PainSc:        Isolation Precautions No active isolations  Medications Medications  lactated ringers infusion ( Intravenous New Bag/Given 03/30/23 0944)  morphine (MSIR) tablet 15 mg (has no administration in time range)  buPROPion (WELLBUTRIN XL) 24 hr tablet 300 mg (has no administration in time range)  LORazepam (ATIVAN) tablet 0.5 mg (has no administration in time range)  calcium carbonate (TUMS EX) chewable  tablet 750 mg (has no administration in time range)  pantoprazole (PROTONIX) injection 40 mg (40 mg Intravenous Not Given 03/30/23 0944)  tamsulosin (FLOMAX) capsule 0.8 mg (has no administration in time range)  sucralfate (CARAFATE) 1 GM/10ML suspension 1 g (has no administration in time range)  acetaminophen (TYLENOL) tablet 650 mg (has no administration in time range)    Or  acetaminophen (TYLENOL) suppository 650 mg (has no administration in time range)  traZODone (DESYREL) tablet 25 mg (has no administration in time range)  ondansetron (ZOFRAN) tablet 4 mg (has no administration in time range)    Or  ondansetron (ZOFRAN) injection 4 mg (has no administration in time range)  albuterol (PROVENTIL) (2.5 MG/3ML) 0.083% nebulizer solution 2.5 mg (has no administration in time range)  lactated ringers bolus 1,000 mL (0 mLs Intravenous Stopped 03/30/23  0942)  ondansetron (ZOFRAN) injection 4 mg (4 mg Intravenous Given 03/30/23 0754)  pantoprazole (PROTONIX) injection 40 mg (40 mg Intravenous Given 03/30/23 0754)  0.9 %  sodium chloride infusion (Manually program via Guardrails IV Fluids) (0 mLs Intravenous Stopped 03/30/23 1325)  0.9 %  sodium chloride infusion (Manually program via Guardrails IV Fluids) ( Intravenous New Bag/Given 03/30/23 1418)    Mobility walks

## 2023-03-30 NOTE — Plan of Care (Signed)
  Problem: Education: Goal: Understanding of cardiac disease, CV risk reduction, and recovery process will improve Outcome: Progressing Goal: Individualized Educational Video(s) Outcome: Progressing   Problem: Education: Goal: Knowledge of General Education information will improve Description: Including pain rating scale, medication(s)/side effects and non-pharmacologic comfort measures Outcome: Progressing

## 2023-03-30 NOTE — Care Management (Signed)
Transition of Care [facility]) - Emergency Department Mini Assessment   Patient Details  Name: [name] MRN: [id number] Date of Birth: February 02, 1933  Transition of Care [facility]) CM/SW Contact:    Lavenia Atlas, RN Phone Number: 03/30/2023, 1:05 PM   Clinical Narrative: This RNCM received message from [place] with [facility] who reports patient is active for home hospice services. Per chart review patient resides at [facility] and has PMH adenocarcinoma of stomach. Patient is been admitted inpatient.  TOC will continue to follow for needs.   ED Mini Assessment: What brought you to the Emergency Department? : Patient reports having epigastric discomfort and some nausea, covered in dark stool  Barriers to Discharge: Continued Medical Work up  [employer] interventions: none  Means of departure: Ambulance       Patient Contact and Communications        ,                 Admission diagnosis:  Upper GI bleed [K92.2] Patient Active Problem List   Diagnosis Date Noted   Upper GI bleed 03/30/2023   BPH without obstruction/lower urinary tract symptoms 12/30/2022   Closed fracture of metatarsal of left foot 12/27/2022   Elevated troponin level not due myocardial infarction 12/27/2022   Acute on chronic diastolic CHF (congestive heart failure) (HCC) 12/26/2022   Other fracture of left foot, initial encounter for closed fracture 12/26/2022   AKI (acute kidney injury) (HCC) 12/26/2022   Hyponatremia 12/18/2022   General weakness 11/16/2022   Palliative care by specialist 11/14/2022   Elevated troponin 11/14/2022   Symptomatic anemia 11/14/2022   Acute upper GI bleed 11/13/2022   Chest pain 11/13/2022   ABLA (acute blood loss anemia) 11/13/2022   Goals of care, counseling/discussion 10/24/2022   Gastric adenocarcinoma (HCC) 10/09/2022   Abnormal CT scan, stomach 09/18/2022   Gastric ulcer without hemorrhage or perforation 09/18/2022   Leg swelling 03/12/2021    Acute cholecystitis 07/29/2019   Sinus bradycardia 11/26/2016   Prostate cancer (HCC) 02/03/2013   ANXIETY 03/07/2010   Essential hypertension, benign 03/07/2010   GERD 03/07/2010   HOARSENESS, CHRONIC 03/07/2010   DIABETES MELLITUS 03/06/2010   HYPERCHOLESTEROLEMIA 03/06/2010   DEPRESSION 03/06/2010   HIATAL HERNIA WITH REFLUX 03/06/2010   DIVERTICULOSIS, COLON 03/06/2010   IBS 03/06/2010   RENAL CALCULUS, RECURRENT 03/06/2010   COUGH 03/06/2010   PCP:  [name], NP Pharmacy:   [facility] - [place], [place] - [address] [name] [address] Phone: [phone] Fax: [phone]

## 2023-03-30 NOTE — ED Triage Notes (Signed)
Patient brought in by EMS due to GI bleed. Patient saturated in blood on his shirt and GI bleed noted at the facility by EMS. Pt's initial BP was 84/60, given 167ml's of fluid and has helped BP slightly. No complaints.

## 2023-03-30 NOTE — ED Provider Notes (Signed)
[place] EMERGENCY DEPARTMENT AT [facility] Provider Note   CSN: [id number] Arrival date & time: 03/30/23  5284     History  Chief Complaint  Patient presents with   Rectal Bleeding    [name] is a 89 y.o. male.  Pt is an 89y/o male with hx of adenocarcinoma of the stomach,  BPH, hypertension, GERD, hyperlipidemia, prior GI bleed thought to be from gastic mass and CHF with EF of 60-65% presenting today from his nursing facility for vomiting since yesterday of a black material.  When EMS arrived they reported that patient was pale, tachycardic and hypotensive.  He was covered in black emesis.  Patient reports having epigastric discomfort and some nausea.  He denies any lower abdominal pain, chest pain or shortness of breath.  EMS provided patient 250 mL of fluid with improvement of heart rate and blood pressure.  Patient last received a blood transfusion during hospitalization in June.  He is a DNR but when asked if he would want a blood transfusion he does state he would.  Nursing home reports he been having normal bowel movements that did not appear bloody.  The history is provided by the patient, the EMS personnel, the nursing home and medical records.  Rectal Bleeding      Home Medications Prior to Admission medications   Medication Sig Start Date End Date Taking? Authorizing Provider  acetaminophen (TYLENOL) 500 MG tablet Take 500 mg by mouth every 6 (six) hours as needed for mild pain or headache.    [provider]  buPROPion (WELLBUTRIN XL) 300 MG 24 hr tablet Take 1 tablet (300 mg total) by mouth daily. 12/30/22   Shalhoub, Deno Lunger, MD  calcium carbonate (TUMS E-X 750) 750 MG chewable tablet Chew 1 tablet by mouth 3 (three) times daily as needed for heartburn.    [provider]  chlorhexidine (PERIDEX) 0.12 % solution Use as directed 15 mLs in the mouth or throat 2 (two) times daily.    [provider]  ferrous sulfate  325 (65 FE) MG EC tablet Take 1 tablet (325 mg total) by mouth daily with breakfast. 10/24/22   Heilingoetter, [name], [name]  fluticasone (FLONASE) 50 MCG/ACT nasal spray Place 1-2 sprays into both nostrils 2 (two) times daily as needed (for nasal congestion).    [provider]  gabapentin (NEURONTIN) 800 MG tablet Take 800 mg by mouth 3 (three) times daily. 06/19/22   [provider]  L-Methylfolate-Algae-B12-B6 Latrelle Dodrill) 3-90.314-2-35 MG CAPS Take 1 capsule by mouth 2 (two) times daily.    [provider]  loperamide (IMODIUM) 2 MG capsule Take 2 mg by mouth as needed for diarrhea or loose stools.    [provider]  loratadine (CLARITIN) 10 MG tablet Take 10 mg by mouth daily.    [provider]  LORazepam (ATIVAN) 0.5 MG tablet Take 1 tablet (0.5 mg total) by mouth every 6 (six) hours as needed for anxiety. 12/30/22   Shalhoub, Deno Lunger, MD  losartan (COZAAR) 25 MG tablet Take 1 tablet (25 mg total) by mouth daily. 12/30/22   Shalhoub, Deno Lunger, MD  morphine (MSIR) 15 MG tablet Take 1 tablet (15 mg total) by mouth every 6 (six) hours as needed for severe pain. 12/30/22   Shalhoub, Deno Lunger, MD  Multiple Vitamins-Minerals (CENTRUM SILVER 50+MEN) TABS Take 1 tablet by mouth daily with breakfast.    [provider]  Omega-3 Fatty Acids (FISH OIL ULTRA) 1400 MG  CAPS Take 1,400 mg by mouth daily.    [provider]  omeprazole (PRILOSEC) 40 MG capsule Take 40 mg by mouth daily before breakfast.    [provider]  pantoprazole (PROTONIX) 40 MG tablet Take 1 tablet (40 mg total) by mouth 2 (two) times daily. 11/15/22 12/18/22  [name], MD  polyethylene glycol (MIRALAX / GLYCOLAX) 17 g packet Take 17 g by mouth daily. 12/22/22   Amin, Ankit C, MD  primidone (MYSOLINE) 50 MG tablet Take 50 mg by mouth 2 (two) times daily.    [provider]  senna-docusate (SENOKOT-S) 8.6-50 MG tablet Take 1 tablet by mouth 2 (two) times  daily. 11/17/22   [name], MD  tamsulosin (FLOMAX) 0.4 MG CAPS capsule Take 0.8 mg by mouth at bedtime.    [provider]      Allergies    Patient has no known allergies.    Review of Systems   Review of Systems  Gastrointestinal:  Positive for hematochezia.    Physical Exam Updated Vital Signs BP 137/82   Pulse (!) 101   Temp 97.8 F (36.6 C) (Oral)   Resp 14   Ht 5\' 5"  (1.651 m)   Wt 84.6 kg   SpO2 97%   BMI 31.04 kg/m  Physical Exam Vitals and nursing note reviewed.  Constitutional:      General: He is in acute distress.     Appearance: He is well-developed. He is ill-appearing.  HENT:     Head: Normocephalic and atraumatic.     Mouth/Throat:     Mouth: Mucous membranes are dry.     Comments: Black residue on the mouth and over the face Eyes:     Conjunctiva/sclera: Conjunctivae normal.     Pupils: Pupils are equal, round, and reactive to light.  Cardiovascular:     Rate and Rhythm: Regular rhythm. Tachycardia present.     Heart sounds: No murmur heard. Pulmonary:     Effort: Pulmonary effort is normal. No respiratory distress.     Breath sounds: Normal breath sounds. No wheezing or rales.  Abdominal:     General: There is no distension.     Palpations: Abdomen is soft.     Tenderness: There is abdominal tenderness in the epigastric area. There is no guarding or rebound.  Genitourinary:    Rectum: Guaiac result positive.     Comments: Stool is brown Musculoskeletal:        General: No tenderness. Normal range of motion.     Cervical back: Normal range of motion and neck supple.     Right lower leg: No edema.     Left lower leg: No edema.  Skin:    General: Skin is warm and dry.     Coloration: Skin is pale.     Findings: No erythema or rash.  Neurological:     Mental Status: He is alert and oriented to person, place, and time. Mental status is at baseline.  Psychiatric:        Mood and Affect: Mood normal.        Behavior: Behavior  normal.     ED Results / Procedures / Treatments   Labs (all labs ordered are listed, but only abnormal results are displayed) Labs Reviewed  CBC WITH DIFFERENTIAL/PLATELET - Abnormal; Notable for the following components:      Result Value   WBC 13.2 (*)    RBC 2.80 (*)    Hemoglobin 6.5 (*)  HCT 22.6 (*)    MCH 23.2 (*)    MCHC 28.8 (*)    RDW 17.5 (*)    Platelets 583 (*)    Neutro Abs 11.6 (*)    Abs Immature Granulocytes 0.08 (*)    All other components within normal limits  COMPREHENSIVE METABOLIC PANEL - Abnormal; Notable for the following components:   Chloride 93 (*)    Glucose, Bld 247 (*)    BUN 39 (*)    Creatinine, Ser 1.48 (*)    Calcium 8.5 (*)    Total Protein 5.6 (*)    Albumin 2.5 (*)    AST 12 (*)    GFR, Estimated 45 (*)    All other components within normal limits  CBG MONITORING, ED - Abnormal; Notable for the following components:   Glucose-Capillary 219 (*)    All other components within normal limits  POC OCCULT BLOOD, ED - Abnormal; Notable for the following components:   Fecal Occult Bld POSITIVE (*)    All other components within normal limits  I-STAT CG4 LACTIC ACID, ED - Abnormal; Notable for the following components:   Lactic Acid, Venous 2.8 (*)    All other components within normal limits  LIPASE, BLOOD  URINALYSIS, W/ REFLEX TO CULTURE (INFECTION SUSPECTED)  TYPE AND SCREEN  PREPARE RBC (CROSSMATCH)    EKG EKG Interpretation Date/Time:  Monday March 30 2023 07:43:23 EDT Ventricular Rate:  108 PR Interval:  200 QRS Duration:  96 QT Interval:  301 QTC Calculation: 404 R Axis:   76  Text Interpretation: Sinus tachycardia Nonspecific T abnormalities, diffuse leads improved since prior tracing Confirmed by Gwyneth Sprout ([id number]) on 03/30/2023 7:52:45 AM  Radiology No results found.  Procedures Procedures    Medications Ordered in ED Medications  lactated ringers infusion (has no administration in time range)   0.9 %  sodium chloride infusion (Manually program via Guardrails IV Fluids) (has no administration in time range)  lactated ringers bolus 1,000 mL (1,000 mLs Intravenous New Bag/Given 03/30/23 0754)  ondansetron (ZOFRAN) injection 4 mg (4 mg Intravenous Given 03/30/23 0754)  pantoprazole (PROTONIX) injection 40 mg (40 mg Intravenous Given 03/30/23 0754)    ED Course/ Medical Decision Making/ A&P                                 Medical Decision Making Amount and/or Complexity of Data Reviewed Independent Historian: EMS External Data Reviewed: notes. Labs: ordered. Decision-making details documented in ED Course. Radiology: ordered and independent interpretation performed. Decision-making details documented in ED Course. ECG/medicine tests: ordered and independent interpretation performed. Decision-making details documented in ED Course.  Risk Prescription drug management. Decision regarding hospitalization.   Pt with multiple medical problems and comorbidities and presenting today with a complaint that caries a high risk for morbidity and mortality.  Here today with emesis that started yesterday and progressed till today.  Patient is complaining of feeling generally weak with some epigastric discomfort.  He denies any lower abdominal pain.  He denies any chest pain or shortness of breath.  Patient is tachycardic and hypotensive here.  No report of fever.  Stool is brown without obvious blood or melena.  Concern for GI bleed from known gastric cancer, acute blood loss causing abnormal vital signs versus dehydration, electrolyte abnormality, AKI, versus pneumonia.  I independently interpreted patient's labs and EKG.  EKG with sinus tachycardia without significant ST changes concerning for ACS.  CBC with leukocytosis of 13, hemoglobin with new anemia with a hemoglobin of 6.5 from a range in the nines and June, fecal occult is positive and lactate is elevated at 2.8.  CMP with mild AKI today with  creatinine of 1.4 from baseline of 1 and blood sugar of 247.  Lipase within normal limits.  I have independently visualized and interpreted pt's images today.  Chest x-ray without acute findings today Patient was given 1 L of LR and then started on a rate.  Given new drop in hemoglobin we will transfuse 2 units which patient is agreeable to.  Patient was also given IV Protonix.  Will consult hospitalist for admission as patient will require hospitalization.  He does not take any anticoagulation regularly.  Based on prior records patient declined any type of therapy related to the stomach ulcer.  He declined immunotherapy, chemotherapy, radiation or surgery.  He transition to hospice care.  CRITICAL CARE Performed by: Kinzey Sheriff Total critical care time: 30 minutes Critical care time was exclusive of separately billable procedures and treating other patients. Critical care was necessary to treat or prevent imminent or life-threatening deterioration. Critical care was time spent personally by me on the following activities: development of treatment plan with patient and/or surrogate as well as nursing, discussions with consultants, evaluation of patient's response to treatment, examination of patient, obtaining history from patient or surrogate, ordering and performing treatments and interventions, ordering and review of laboratory studies, ordering and review of radiographic studies, pulse oximetry and re-evaluation of patient's condition.        Final Clinical Impression(s) / ED Diagnoses Final diagnoses:  Acute upper GI bleeding  AKI (acute kidney injury) (HCC)  Symptomatic anemia    Rx / DC Orders ED Discharge Orders     None         Gwyneth Sprout, MD 03/30/23 [phone]

## 2023-03-30 NOTE — H&P (Signed)
History and Physical  [name] NWG:[id number] DOB: 1932/09/07 DOA: 03/30/2023  PCP: [name], NP   Chief Complaint: Vomiting blood  HPI: [name] is a 89 y.o. male with medical history significant for GERD, hypertension, hyperlipidemia, known gastric adenocarcinoma untreated being admitted to the hospital with upper GI bleed.  Patient states he was in his usual state of health that his facility until yesterday around lunchtime when he had 2 episodes of dark vomiting.  This happened again in the evening at dinnertime.  He denies any abdominal pain, fevers, chills.  He was brought to the hospital for evaluation of this.  He does confirm to me that he would not want any surgery, or endoscopy, but is okay with receiving blood transfusions if needed.  ED Course: In the emergency department he has been afebrile, heart rate 112, blood pressure 100/61.  Blood pressure is now improved to 137/82.  Saturating well on room air.  Lab work significant for WBC 13, hemoglobin 6.5, lactate 2.8, creatinine 1.48 up from baseline normal renal function.  ALT 12.  He was given LR fluid bolus, IV Protonix, and orders were placed for a 1 unit blood transfusion.  Hospitalist was contacted for admission.  Review of Systems: Please see HPI for pertinent positives and negatives. A complete 10 system review of systems are otherwise negative.  Past Medical History:  Diagnosis Date   Anxiety    Cataract    Depression    Diabetes mellitus    GERD (gastroesophageal reflux disease)    Hyperlipemia    Hypertension    Past Surgical History:  Procedure Laterality Date   CHOLECYSTECTOMY N/A 07/30/2019   Procedure: LAPAROSCOPIC CHOLECYSTECTOMY;  Surgeon: [name], [name], MD;  Location: [facility] ORS;  Service: General;  Laterality: N/A;   FRACTURE SURGERY     HERNIA REPAIR      Social History:  reports that he has never smoked. He has never used smokeless tobacco. He reports that he  does not drink alcohol and does not use drugs.   No Known Allergies  Family History  Problem Relation Age of Onset   Lung cancer Brother    Colon cancer Neg Hx    Esophageal cancer Neg Hx    Rectal cancer Neg Hx    Stomach cancer Neg Hx      Prior to Admission medications   Medication Sig Start Date End Date Taking? Authorizing Provider  acetaminophen (TYLENOL) 500 MG tablet Take 500 mg by mouth every 6 (six) hours as needed for mild pain or headache.    [provider]  buPROPion (WELLBUTRIN XL) 300 MG 24 hr tablet Take 1 tablet (300 mg total) by mouth daily. 12/30/22   Shalhoub, Deno Lunger, MD  calcium carbonate (TUMS E-X 750) 750 MG chewable tablet Chew 1 tablet by mouth 3 (three) times daily as needed for heartburn.    [provider]  chlorhexidine (PERIDEX) 0.12 % solution Use as directed 15 mLs in the mouth or throat 2 (two) times daily.    [provider]  ferrous sulfate 325 (65 FE) MG EC tablet Take 1 tablet (325 mg total) by mouth daily with breakfast. 10/24/22   Heilingoetter, [name], [name]  fluticasone (FLONASE) 50 MCG/ACT nasal spray Place 1-2 sprays into both nostrils 2 (two) times daily as needed (for nasal congestion).    [provider]  gabapentin (NEURONTIN) 800 MG tablet Take 800 mg by mouth 3 (three) times daily. 06/19/22   [provider]  L-Methylfolate-Algae-B12-B6 Latrelle Dodrill) 3-90.314-2-35 MG CAPS Take 1 capsule by mouth 2 (two) times daily.    [provider]  loperamide (IMODIUM) 2 MG capsule Take 2 mg by mouth as needed for diarrhea or loose stools.    [provider]  loratadine (CLARITIN) 10 MG tablet Take 10 mg by mouth daily.    [provider]  LORazepam (ATIVAN) 0.5 MG tablet Take 1 tablet (0.5 mg total) by mouth every 6 (six) hours as needed for anxiety. 12/30/22   Shalhoub, Deno Lunger, MD  losartan (COZAAR) 25 MG tablet Take 1 tablet (25 mg total) by mouth daily. 12/30/22   Shalhoub, Deno Lunger, MD  morphine (MSIR) 15 MG tablet Take 1 tablet (15 mg total) by mouth every 6 (six) hours as needed for severe pain. 12/30/22   Shalhoub, Deno Lunger, MD  Multiple Vitamins-Minerals (CENTRUM SILVER 50+MEN) TABS Take 1 tablet by mouth daily with breakfast.    [provider]  Omega-3 Fatty Acids (FISH OIL ULTRA) 1400 MG CAPS Take 1,400 mg by mouth daily.    [provider]  omeprazole (PRILOSEC) 40 MG capsule Take 40 mg by mouth daily before breakfast.    [provider]  pantoprazole (PROTONIX) 40 MG tablet Take 1 tablet (40 mg total) by mouth 2 (two) times daily. 11/15/22 12/18/22  [name], MD  polyethylene glycol (MIRALAX / GLYCOLAX) 17 g packet Take 17 g by mouth daily. 12/22/22   Amin, Ankit C, MD  primidone (MYSOLINE) 50 MG tablet Take 50 mg by mouth 2 (two) times daily.    [provider]  senna-docusate (SENOKOT-S) 8.6-50 MG tablet Take 1 tablet by mouth 2 (two) times daily. 11/17/22   [name], MD  tamsulosin (FLOMAX) 0.4 MG CAPS capsule Take 0.8 mg by mouth at bedtime.    [provider]    Physical Exam: BP 137/82   Pulse (!) 101   Temp 97.8 F (36.6 C) (Oral)   Resp 14   Ht 5\' 5"  (1.651 m)   Wt 84.6 kg   SpO2 97%   BMI 31.04 kg/m   General:  Alert, oriented, calm, in no acute distress, pleasant and cooperative, appears stated age and looks slightly pale Eyes: EOMI, clear conjuctivae, white sclerea Neck: supple, no masses, trachea mildline  Cardiovascular: RRR, no murmurs or rubs, no peripheral edema  Respiratory: clear to auscultation bilaterally, no wheezes, no crackles  Abdomen: soft, nontender, nondistended, normal bowel tones heard  Skin: dry, no rashes  Musculoskeletal: no joint effusions, normal range of motion  Psychiatric: appropriate affect, normal speech  Neurologic: extraocular muscles intact, clear speech, moving all extremities with intact sensorium         Labs on Admission:  Basic Metabolic  Panel: Recent Labs  Lab 03/30/23 0735  NA 136  K 4.4  CL 93*  CO2 31  GLUCOSE 247*  BUN 39*  CREATININE 1.48*  CALCIUM 8.5*   Liver Function Tests: Recent Labs  Lab 03/30/23 0735  AST 12*  ALT 10  ALKPHOS 71  BILITOT 0.3  PROT 5.6*  ALBUMIN 2.5*   Recent Labs  Lab 03/30/23 0735  LIPASE 23   No results for input(s): "AMMONIA" in the last 168 hours. CBC: Recent Labs  Lab 03/30/23 0735  WBC 13.2*  NEUTROABS 11.6*  HGB 6.5*  HCT 22.6*  MCV 80.7  PLT 583*   Cardiac Enzymes: No results for input(s): "CKTOTAL", "CKMB", "CKMBINDEX", "TROPONINI" in the last 168 hours.  BNP (last  3 results) Recent Labs    12/26/22 1011  BNP 739.2*    ProBNP (last 3 results) No results for input(s): "PROBNP" in the last 8760 hours.  CBG: Recent Labs  Lab 03/30/23 0735  GLUCAP 219*    Radiological Exams on Admission: DG Chest Port 1 View  Result Date: 03/30/2023 CLINICAL DATA:  Vomiting EXAM: PORTABLE CHEST 1 VIEW COMPARISON:  Chest radiograph dated 12/26/2022 FINDINGS: Low lung volumes with bronchovascular crowding. Left basilar linear and dense opacities. Right basilar patchy opacities. No pleural effusion or pneumothorax. The heart size and mediastinal contours are within normal limits. No acute osseous abnormality. IMPRESSION: Low lung volumes with bibasilar opacities, likely atelectasis. Electronically Signed   By: Agustin Cree M.D.   On: 03/30/2023 08:57    Assessment/Plan [name] is a 89 y.o. male with medical history significant for GERD, hypertension, hyperlipidemia, known gastric adenocarcinoma untreated being admitted to the hospital with upper GI bleed.   Upper GI bleed-presumably from his known history of gastric adenocarcinoma for which she has declined treatment.  He has seen palliative care in the past and they have helped establish goals of care. -Inpatient admission to progressive -Continue IV PPI -Clear liquid diet -Transfuse total of 2  units PRBC  Gastric adenocarcinoma-not being treated, continue MS IR for pain  Hypertension-hold losartan due to relative hypotension and AKI  AKI-in the setting of baseline normal renal function, his creatinine is elevated likely due to his relative hypotension and dehydration from vomiting. -Avoid nephrotoxins, hydrate -Follow renal function with daily labs  Depression-Wellbutrin  Anxiety-Ativan as needed  BPH-Flomax  DVT prophylaxis: SCDs only    Code Status: Limited: Do not attempt resuscitation (DNR) -DNR-LIMITED -Do Not Intubate/DNI   Consults called: None  Admission status: The appropriate patient status for this patient is INPATIENT. Inpatient status is judged to be reasonable and necessary in order to provide the required intensity of service to ensure the patient's safety. The patient's presenting symptoms, physical exam findings, and initial radiographic and laboratory data in the context of their chronic comorbidities is felt to place them at high risk for further clinical deterioration. Furthermore, it is not anticipated that the patient will be medically stable for discharge from the hospital within 2 midnights of admission.    I certify that at the point of admission it is my clinical judgment that the patient will require inpatient hospital care spanning beyond 2 midnights from the point of admission due to high intensity of service, high risk for further deterioration and high frequency of surveillance required   Time spent: 49 minutes  [name] Sharlette Dense MD [facility] Pager [phone]  If 7PM-7AM, please contact night-coverage [email or web address] Password [email or web address]  03/30/2023, 9:03 AM

## 2023-03-31 DIAGNOSIS — C163 Malignant neoplasm of pyloric antrum: Secondary | ICD-10-CM | POA: Diagnosis not present

## 2023-03-31 DIAGNOSIS — D62 Acute posthemorrhagic anemia: Secondary | ICD-10-CM | POA: Diagnosis not present

## 2023-03-31 DIAGNOSIS — D649 Anemia, unspecified: Secondary | ICD-10-CM

## 2023-03-31 DIAGNOSIS — K922 Gastrointestinal hemorrhage, unspecified: Secondary | ICD-10-CM | POA: Diagnosis not present

## 2023-03-31 DIAGNOSIS — N179 Acute kidney failure, unspecified: Secondary | ICD-10-CM | POA: Diagnosis not present

## 2023-03-31 DIAGNOSIS — L8990 Pressure ulcer of unspecified site, unspecified stage: Secondary | ICD-10-CM | POA: Diagnosis present

## 2023-03-31 LAB — TYPE AND SCREEN
ABO/RH(D): A POS
Antibody Screen: NEGATIVE
Unit division: 0
Unit division: 0

## 2023-03-31 LAB — BPAM RBC
Blood Product Expiration Date: 202410292359
Blood Product Expiration Date: 202410292359
ISSUE DATE / TIME: 202409301100
ISSUE DATE / TIME: 202409301409
Unit Type and Rh: 6200
Unit Type and Rh: 6200

## 2023-03-31 LAB — CBC
HCT: 24.4 % — ABNORMAL LOW (ref 39.0–52.0)
Hemoglobin: 7.5 g/dL — ABNORMAL LOW (ref 13.0–17.0)
MCH: 25 pg — ABNORMAL LOW (ref 26.0–34.0)
MCHC: 30.7 g/dL (ref 30.0–36.0)
MCV: 81.3 fL (ref 80.0–100.0)
Platelets: 282 10*3/uL (ref 150–400)
RBC: 3 MIL/uL — ABNORMAL LOW (ref 4.22–5.81)
RDW: 17.2 % — ABNORMAL HIGH (ref 11.5–15.5)
WBC: 8.1 10*3/uL (ref 4.0–10.5)
nRBC: 0 % (ref 0.0–0.2)

## 2023-03-31 LAB — BASIC METABOLIC PANEL
Anion gap: 6 (ref 5–15)
BUN: 40 mg/dL — ABNORMAL HIGH (ref 8–23)
CO2: 28 mmol/L (ref 22–32)
Calcium: 7.9 mg/dL — ABNORMAL LOW (ref 8.9–10.3)
Chloride: 101 mmol/L (ref 98–111)
Creatinine, Ser: 1.27 mg/dL — ABNORMAL HIGH (ref 0.61–1.24)
GFR, Estimated: 54 mL/min — ABNORMAL LOW (ref >60)
Glucose, Bld: 126 mg/dL — ABNORMAL HIGH (ref 70–99)
Potassium: 3.7 mmol/L (ref 3.5–5.1)
Sodium: 135 mmol/L (ref 135–145)

## 2023-03-31 LAB — RETICULOCYTES
Immature Retic Fract: 29.6 % — ABNORMAL HIGH (ref 2.3–15.9)
RBC.: 2.92 MIL/uL — ABNORMAL LOW (ref 4.22–5.81)
Retic Count, Absolute: 88.5 K/uL (ref 19.0–186.0)
Retic Ct Pct: 3 % (ref 0.4–3.1)

## 2023-03-31 LAB — IRON AND TIBC
Iron: 18 ug/dL — ABNORMAL LOW (ref 45–182)
Saturation Ratios: 9 % — ABNORMAL LOW (ref 17.9–39.5)
TIBC: 211 ug/dL — ABNORMAL LOW (ref 250–450)
UIBC: 193 ug/dL

## 2023-03-31 LAB — VITAMIN B12: Vitamin B-12: 343 pg/mL (ref 180–914)

## 2023-03-31 LAB — FOLATE: Folate: 9.7 ng/mL (ref >5.9)

## 2023-03-31 LAB — HEMOGLOBIN A1C
Hgb A1c MFr Bld: 5.4 % (ref 4.8–5.6)
Mean Plasma Glucose: 108.28 mg/dL

## 2023-03-31 LAB — FERRITIN: Ferritin: 13 ng/mL — ABNORMAL LOW (ref 24–336)

## 2023-03-31 LAB — GLUCOSE, CAPILLARY: Glucose-Capillary: 114 mg/dL — ABNORMAL HIGH (ref 70–99)

## 2023-03-31 MED ORDER — ORAL CARE MOUTH RINSE: 15.0000 mL | OROMUCOSAL | Status: DC | PRN

## 2023-03-31 MED ORDER — INSULIN ASPART 100 UNIT/ML IJ SOLN: 0.0000 [IU] | Freq: Three times a day (TID) | INTRAMUSCULAR | Status: DC

## 2023-03-31 MED ORDER — PANTOPRAZOLE SODIUM 40 MG IV SOLR: 40.0000 mg | Freq: Two times a day (BID) | INTRAVENOUS | Status: DC

## 2023-03-31 MED ORDER — LORAZEPAM 0.5 MG PO TABS: 0.5000 mg | ORAL_TABLET | Freq: Four times a day (QID) | ORAL | 0 refills | Status: AC | PRN

## 2023-03-31 MED ORDER — MORPHINE SULFATE 15 MG PO TABS: 15.0000 mg | ORAL_TABLET | Freq: Four times a day (QID) | ORAL | 0 refills | Status: AC | PRN

## 2023-03-31 MED ORDER — SUCRALFATE 1 GM/10ML PO SUSP: 1.0000 g | Freq: Three times a day (TID) | ORAL | 3 refills | Status: AC

## 2023-03-31 MED ORDER — SUCRALFATE 1 GM/10ML PO SUSP: 1.0000 g | Freq: Three times a day (TID) | ORAL | 0 refills | Status: DC

## 2023-03-31 NOTE — Progress Notes (Signed)
[facility]                                                                              [name], is a 89 y.o. male, DOB - 1933-05-08, QMV:[id number] Admit date - 03/30/2023    Outpatient Primary MD for the patient is Jason Paris, NP  LOS - 1  days  Chief Complaint  Patient presents with   Rectal Bleeding       Brief summary   Patient is a 89 year old male with known history of gastric adenocarcinoma diagnosed in April 2024, untreated, GERD, HTN, HLP, chronic anemia, diet controlled diabetes mellitus presented with 2 episodes of hematemesis.  Patient is a resident of [facility], had 2 episodes of dark emesis around lunchtime and subsequently again in the evening at dinner.  Patient was brought to the hospital for evaluation. In ED, heart rate 112, BP 100/61, hemoglobin 6.5, lactate 2.8, creatinine 1.48. Patient was admitted for further workup and transfusion.  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  Patient had seen Dr. Mosetta Putt, was offered immunotherapy treatment however patient declined. -Patient had required transfusion in May and another in June.  Presented with coffee-ground emesis x 3 with hemoglobin down to 6.5. -Transfused 1 unit packed RBCs, hemoglobin 7.5 today, on clear liquid diet, reports no further emesis -GI consulted, recommended to continue PPI twice daily, no plans for endoscopic intervention as no good endoscopic therapy for tumor bleeding.  If he has any significant active bleeding, could consider IR consultation for potential embolization to control the bleeding. -Consulted palliative medicine for goals of care  -Anemia panel showed iron deficiency.  Will advance diet, H&H in a.m. if stable then DC back to ALF.   Active Problems:   AKI (acute kidney  injury) (HCC) -Likely due to acute symptomatic anemia and vomiting -Creatinine 1.48 on admission.  Creatinine was 1.0 on 12/28/2022 -Received 1 unit packed RBCs, creatinine improving to 1.27 today -Hold statin, gabapentin  History of gastric adenocarcinoma (HCC) -Previously has seen Dr. Mosetta Putt, was offered immunotherapy however patient had declined treatment     Essential hypertension, benign -BP soft, continue to hold amlodipine, losartan    GERD -Continue Protonix twice daily, Carafate  Diabetes mellitus type 2, diet controlled - CBG (last 3)  Recent Labs    03/30/23 0735  GLUCAP 219*   -Obtain hemoglobin A1c, placed on sensitive sliding scale insulin    Pressure injury of skin Pressure Injury 03/30/23 Vertebral column Right;Upper Stage 2 -  Partial thickness loss of dermis presenting as a shallow open injury with a red, pink wound bed without slough. (Active)  03/30/23 2300  Location: Vertebral column  Location Orientation: Right;Upper  Staging: Stage 2 -  Partial thickness loss of dermis presenting as a shallow open injury with a red, pink wound bed without slough.  Wound Description (Comments):   Present on Admission: Yes  Dressing Type Foam - Lift dressing to assess site every shift 03/31/23 1000  Pressure Injury 03/30/23 Vertebral column Right Stage 2 -  Partial thickness loss of dermis presenting as a shallow open injury with a red, pink wound bed without slough. (Active)  03/30/23 2300  Location: Vertebral column  Location Orientation: Right  Staging: Stage 2 -  Partial thickness loss of dermis presenting as a shallow open injury with a red, pink wound bed without slough.  Wound Description (Comments):   Present on Admission: Yes  Dressing Type Foam - Lift dressing to assess site every shift 03/31/23 1000    Obesity Estimated body mass index is 31.04 kg/m as calculated from the following:   Height as of this encounter: 5\' 5"  (1.651 m).   Weight as of this  encounter: 84.6 kg.  Code Status: DNR DVT Prophylaxis:  SCDs Start: 03/30/23 0858   Level of Care: Level of care: Progressive Family Communication: Updated patient Disposition Plan:      Remains inpatient appropriate: Pending palliative GOC, if stable H&H in a.m., possible DC back to [facility]   Procedures:    Consultants:   GI Palliative medicine  Antimicrobials:   Anti-infectives (From admission, onward)    None          Medications  buPROPion  300 mg Oral Daily   pantoprazole (PROTONIX) IV  40 mg Intravenous Q24H   sucralfate  1 g Oral TID WC & HS   tamsulosin  0.8 mg Oral QHS      Subjective:   [name] was seen and examined today.  Tolerating clear liquid diet without any difficulty, no further hematemesis noted, no hematochezia or melena since admission.  Denies any dizziness, chest pain, shortness of breath, abdominal pain.  Objective:   Vitals:   03/30/23 1515 03/30/23 1614 03/30/23 1645 03/30/23 2028  BP: (!) 143/75 138/79 137/66 125/63  Pulse: (!) 109 (!) 104 (!) 103 (!) 105  Resp: 17  18 18   Temp:  98.4 F (36.9 C) 98.9 F (37.2 C) 98.6 F (37 C)  TempSrc:  Oral Oral Oral  SpO2: 94% 98% 99% 97%  Weight:      Height:        Intake/Output Summary (Last 24 hours) at 03/31/2023 1239 Last data filed at 03/31/2023 8295 Gross per 24 hour  Intake 2908.28 ml  Output --  Net 2908.28 ml     Wt Readings from Last 3 Encounters:  03/30/23 84.6 kg  12/28/22 84.6 kg  11/28/22 81.6 kg     Exam General: Alert and oriented x 3, NAD Cardiovascular: S1 S2 auscultated,  RRR Respiratory: Clear to auscultation bilaterally, no wheezing Gastrointestinal: Soft, nontender, nondistended, + bowel sounds Ext: no pedal edema bilaterally Neuro: no new deficits Psych: Normal affect     Data Reviewed:  I have personally reviewed following labs    CBC Lab Results  Component Value Date   WBC 8.1 03/31/2023   RBC 3.00 (L)  03/31/2023   RBC 2.92 (L) 03/31/2023   HGB 7.5 (L) 03/31/2023   HCT 24.4 (L) 03/31/2023   MCV 81.3 03/31/2023   MCH 25.0 (L) 03/31/2023   PLT 282 03/31/2023   MCHC 30.7 03/31/2023   RDW 17.2 (H) 03/31/2023   LYMPHSABS 0.8 03/30/2023   MONOABS 0.7 03/30/2023   EOSABS 0.0 03/30/2023   BASOSABS 0.0 03/30/2023     Last metabolic panel Lab Results  Component Value Date   NA 135 03/31/2023   K 3.7 03/31/2023   CL 101 03/31/2023   CO2 28 03/31/2023  BUN 40 (H) 03/31/2023   CREATININE 1.27 (H) 03/31/2023   GLUCOSE 126 (H) 03/31/2023   GFRNONAA 54 (L) 03/31/2023   GFRAA >60 08/02/2019   CALCIUM 7.9 (L) 03/31/2023   PHOS 3.0 12/26/2022   PROT 5.6 (L) 03/30/2023   ALBUMIN 2.5 (L) 03/30/2023   BILITOT 0.3 03/30/2023   ALKPHOS 71 03/30/2023   AST 12 (L) 03/30/2023   ALT 10 03/30/2023   ANIONGAP 6 03/31/2023    CBG (last 3)  Recent Labs    03/30/23 0735  GLUCAP 219*      Coagulation Profile: No results for input(s): "INR", "PROTIME" in the last 168 hours.   Radiology Studies: I have personally reviewed the imaging studies  DG Chest Port 1 View  Result Date: 03/30/2023 CLINICAL DATA:  Vomiting EXAM: PORTABLE CHEST 1 VIEW COMPARISON:  Chest radiograph dated 12/26/2022 FINDINGS: Low lung volumes with bronchovascular crowding. Left basilar linear and dense opacities. Right basilar patchy opacities. No pleural effusion or pneumothorax. The heart size and mediastinal contours are within normal limits. No acute osseous abnormality. IMPRESSION: Low lung volumes with bibasilar opacities, likely atelectasis. Electronically Signed   By: Agustin Cree M.D.   On: 03/30/2023 08:57       Elissa Grieshop M.D. [facility] 03/31/2023, 12:39 PM  Available via Epic secure chat 7am-7pm After 7 pm, please refer to night coverage provider listed on amion.

## 2023-03-31 NOTE — Consult Note (Signed)
Consultation Note Date: 03/31/2023   Patient Name: [name]  DOB: 06-18-1933  MRN: [id number]  Age / Sex: 89 y.o., male  PCP: [name], NP Referring Physician: Cathren Harsh, MD  Reason for Consultation: Establishing goals of care  HPI/Patient Profile: 89 y.o. male  admitted on 03/30/2023.  Patient is a 89 year old male with known history of gastric adenocarcinoma diagnosed in April 2024, untreated, GERD, HTN, HLP, chronic anemia, diet controlled diabetes mellitus presented with 2 episodes of hematemesis. Patient is a resident of [facility], had 2 episodes of dark emesis around lunchtime and subsequently again in the evening at dinner. Patient was brought to the hospital for evaluation.  Clinical Assessment and Goals of Care: Patient known to palliative service, seen in previous hospitalizations.  Life limiting illness of moderately differentiated adenocarcinoma of the stomach diagnosed in April 2024.  Patient brought into the hospital because of coffee-ground emesis.  He has [facility] hospice at [facility] facility.   Palliative medicine is specialized medical care for people living with serious illness. It focuses on providing relief from the symptoms and stress of a serious illness. The goal is to improve quality of life for both the patient and the family. Goals of care: Broad aims of medical therapy in relation to the patient's values and preferences. Our aim is to provide medical care aimed at enabling patients to achieve the goals that matter most to them, given the circumstances of their particular medical situation and their constraints.    NEXT OF KIN  Son [name].  Call placed and discussed with son SUMMARY OF RECOMMENDATIONS   DNR  Back to facility with continuation of hospice services.   Code Status/Advance Care Planning: DNR   Symptom Management:     Palliative Prophylaxis:  Frequent Pain Assessment  Psycho-social/Spiritual:  Desire for further Chaplaincy support:yes Additional Recommendations: Caregiving  Support/Resources  Prognosis:  < 6 months  Discharge Planning:  back to facility with hospice.       Primary Diagnoses: Present on Admission:  Gastric adenocarcinoma (HCC)  Pressure injury of skin  Symptomatic anemia  GERD  Essential hypertension, benign  AKI (acute kidney injury) (HCC)  Acute upper GI bleed   I have reviewed the medical record, interviewed the patient and family, and examined the patient. The following aspects are pertinent.  Past Medical History:  Diagnosis Date   Anxiety    Cataract    Depression    Diabetes mellitus    GERD (gastroesophageal reflux disease)    Hyperlipemia    Hypertension    Social History   Socioeconomic History   Marital status: Widowed    Spouse name: Not on file   Number of children: 2   Years of education: Not on file   Highest education level: Doctorate  Occupational History   Occupation: retired Actuary professor    Employer: [employer]  Tobacco Use   Smoking status: Never   Smokeless tobacco: Never  Vaping Use   Vaping status: Never Used  Substance and  Sexual Activity   Alcohol use: No   Drug use: No   Sexual activity: Not Currently  Other Topics Concern   Not on file  Social History Narrative   Lives in a senior center.  Has 2 children.  Retired Mudlogger at [employer].  Education; PhD.   Social Determinants of Health   Financial Resource Strain: Not on file  Food Insecurity: No Food Insecurity (03/30/2023)   Hunger Vital Sign    Worried About Running Out of Food in the Last Year: Never true    Ran Out of Food in the Last Year: Never true  Transportation Needs: No Transportation Needs (03/30/2023)   PRAPARE - Administrator, Civil Service (Medical): No    Lack of Transportation (Non-Medical): No  Physical Activity: Not  on file  Stress: Not on file  Social Connections: Not on file   Family History  Problem Relation Age of Onset   Lung cancer Brother    Colon cancer Neg Hx    Esophageal cancer Neg Hx    Rectal cancer Neg Hx    Stomach cancer Neg Hx    Scheduled Meds:  buPROPion  300 mg Oral Daily   insulin aspart  0-9 Units Subcutaneous TID WC   pantoprazole (PROTONIX) IV  40 mg Intravenous Q12H   sucralfate  1 g Oral TID WC & HS   tamsulosin  0.8 mg Oral QHS   Continuous Infusions:  lactated ringers 75 mL/hr at 03/31/23 1306   PRN Meds:.acetaminophen **OR** acetaminophen, albuterol, calcium carbonate, LORazepam, morphine, ondansetron **OR** ondansetron (ZOFRAN) IV, mouth rinse, traZODone Medications Prior to Admission:  Prior to Admission medications   Medication Sig Start Date End Date Taking? Authorizing Provider  amLODipine (NORVASC) 10 MG tablet Take 10 mg by mouth daily.   Yes [provider]  buPROPion (WELLBUTRIN XL) 300 MG 24 hr tablet Take 1 tablet (300 mg total) by mouth daily. 12/30/22  Yes Shalhoub, Deno Lunger, MD  gabapentin (NEURONTIN) 800 MG tablet Take 800 mg by mouth 3 (three) times daily. 06/19/22  Yes [provider]  LORazepam (ATIVAN) 0.5 MG tablet Take 1 tablet (0.5 mg total) by mouth every 6 (six) hours as needed for anxiety. Patient taking differently: Take 0.5 mg by mouth every 4 (four) hours as needed for anxiety (agitation). 12/30/22  Yes Shalhoub, Deno Lunger, MD  morphine (MSIR) 15 MG tablet Take 1 tablet (15 mg total) by mouth every 6 (six) hours as needed for severe pain. 12/30/22  Yes Shalhoub, Deno Lunger, MD  omeprazole (PRILOSEC) 40 MG capsule Take 40 mg by mouth daily before breakfast.   Yes [provider]  ondansetron (ZOFRAN) 4 MG tablet Take 4 mg by mouth every 6 (six) hours as needed for nausea or vomiting. 02/17/23  Yes [provider]  pantoprazole (PROTONIX) 40 MG tablet Take 1 tablet (40 mg total) by mouth 2 (two) times daily.  11/15/22 03/31/23 Yes Pahwani, Daleen Bo, MD  primidone (MYSOLINE) 50 MG tablet Take 50 mg by mouth 2 (two) times daily.   Yes [provider]  senna-docusate (SENOKOT-S) 8.6-50 MG tablet Take 1 tablet by mouth 2 (two) times daily. 11/17/22  Yes [name], MD  tamsulosin (FLOMAX) 0.4 MG CAPS capsule Take 0.4 mg by mouth daily.   Yes [provider]  temazepam (RESTORIL) 30 MG capsule Take 30 mg by mouth at bedtime.   Yes [provider]  losartan (COZAAR) 25 MG tablet Take 1 tablet (25 mg total)  by mouth daily. Patient not taking: Reported on 03/31/2023 12/30/22   Marinda Elk, MD   No Known Allergies Review of Systems + weakness.   Physical Exam Awake alert  S 1 S 2  Clear to auscultation Abdomen soft non tender non distended  No edema   Vital Signs: BP 125/63 (BP Location: Left Arm)   Pulse (!) 105   Temp 98.6 F (37 C) (Oral)   Resp 18   Ht 5\' 5"  (1.651 m)   Wt 84.6 kg   SpO2 97%   BMI 31.04 kg/m  Pain Scale: 0-10   Pain Score: 0-No pain   SpO2: SpO2: 97 % O2 Device:SpO2: 97 % O2 Flow Rate: .O2 Flow Rate (L/min): 1 L/min  IO: Intake/output summary:  Intake/Output Summary (Last 24 hours) at 03/31/2023 1334 Last data filed at 03/31/2023 1308 Gross per 24 hour  Intake 2908.28 ml  Output --  Net 2908.28 ml    LBM:   Baseline Weight: Weight: 84.6 kg Most recent weight: Weight: 84.6 kg     Palliative Assessment/Data:   PPS 50%  Time In: 12.20 Time Out: 1320 Time Total: 60 Greater than 50%  of this time was spent counseling and coordinating care related to the above assessment and plan.  Signed by: [name], MD   Please contact Palliative Medicine Team phone at [phone] for questions and concerns.  For individual provider: See Loretha Stapler

## 2023-03-31 NOTE — Care Management CC44 (Signed)
Condition Code 44 Documentation Completed  Patient Details  Name: [name] MRN: [id number] Date of Birth: [birth year]   Condition Code 44 given:  Yes Patient signature on Condition Code 44 notice:  Yes Documentation of 2 MD's agreement:  Yes Code 44 added to claim:  Yes    Larrie Kass, LCSW 03/31/2023, 3:33 PM

## 2023-03-31 NOTE — Discharge Summary (Signed)
Physician Discharge Summary   Patient: [name] MRN: [id number] DOB: [birth year]  Admit date:     03/30/2023  Discharge date: 03/31/23  Discharge Physician: Thad Ranger, MD     PCP: [name], NP   Recommendations at discharge:   Continue Protonix 40 mg twice daily (patient does not need to take Protonix and Prilosec) Started on Carafate 1 g p.o. 4 times daily  Discharge Diagnoses:    Acute upper GI bleed Invasive gastric adenocarcinoma   AKI (acute kidney injury) (HCC)   Gastric adenocarcinoma (HCC)   Essential hypertension, benign   GERD   Symptomatic anemia   Pressure injury of skin   Hospital Course:  Patient is a 89 year old male with known history of gastric adenocarcinoma diagnosed in April 2024, untreated, GERD, HTN, HLP, chronic anemia, diet controlled diabetes mellitus presented with 2 episodes of hematemesis.  Patient is a resident of [facility], had 2 episodes of dark emesis around lunchtime and subsequently again in the evening at dinner.  Patient was brought to the hospital for evaluation. In ED, heart rate 112, BP 100/61, hemoglobin 6.5, lactate 2.8, creatinine 1.48. Patient was admitted for further workup and transfusion   Assessment and Plan:   Acute upper GI bleed, acute on chronic anemia Iron deficiency -With known history of invasive moderately differentiated adeno carcinoma of the stomach diagnosed in 09/2022.  Patient had seen Dr. Mosetta Putt, was offered immunotherapy treatment however patient declined. -Patient had required transfusion in May and another in June.  Presented with coffee-ground emesis x 3 with hemoglobin down to 6.5. -Transfused 1 unit packed RBCs, hemoglobin 7.5 today, reports no further hematemesis -GI consulted, recommended to continue PPI twice daily, no plans for endoscopic intervention as no good endoscopic therapy for tumor bleeding.  If he has any significant active bleeding, could consider IR consultation for  potential embolization to control the bleeding. -Diet advanced to soft diet -Palliative medicine was consulted for goals of care.  No further workup planned, patient will be discharged back to the facility with continuation of hospice services.        AKI (acute kidney injury) (HCC) -Likely due to acute symptomatic anemia and vomiting -Creatinine 1.48 on admission.  Creatinine was 1.0 on 12/28/2022 -Received 1 unit packed RBCs, creatinine improving to 1.27 today -Hold gabapentin   History of gastric adenocarcinoma (HCC) -Previously has seen Dr. Mosetta Putt, was offered immunotherapy however patient had declined treatment       Essential hypertension, benign -Discontinued losartan, resume Norvasc     GERD -Continue Protonix twice daily, Carafate    Diabetes mellitus type 2, diet controlled Hemoglobin A1c was 4.8 on 11/14/2022      Pressure injury of skin     Pressure Injury 03/30/23 Vertebral column Right;Upper Stage 2 -  Partial thickness loss of dermis presenting as a shallow open injury with a red, pink wound bed without slough. (Active)  03/30/23 2300  Location: Vertebral column  Location Orientation: Right;Upper  Staging: Stage 2 -  Partial thickness loss of dermis presenting as a shallow open injury with a red, pink wound bed without slough.  Wound Description (Comments):   Present on Admission: Yes  Dressing Type Foam - Lift dressing to assess site every shift 03/31/23 1000     Pressure Injury 03/30/23 Vertebral column Right Stage 2 -  Partial thickness loss of dermis presenting as a shallow open injury with a red, pink wound bed without slough. (Active)  03/30/23 2300  Location: Vertebral column  Location  Orientation: Right  Staging: Stage 2 -  Partial thickness loss of dermis presenting as a shallow open injury with a red, pink wound bed without slough.  Wound Description (Comments):   Present on Admission: Yes  Dressing Type Foam - Lift dressing to assess site every shift  03/31/23 1000  Continue wound care per nursing   Obesity Estimated body mass index is 31.04 kg/m as calculated from the following:   Height as of this encounter: 5\' 5"  (1.651 m).   Weight as of this encounter: 84.6 kg.     Pain control - [employer] Controlled Substance Reporting System database was reviewed. and patient was instructed, not to drive, operate heavy machinery, perform activities at heights, swimming or participation in water activities or provide baby-sitting services while on Pain, Sleep and Anxiety Medications; until their outpatient Physician has advised to do so again. Also recommended to not to take more than prescribed Pain, Sleep and Anxiety Medications.  Consultants: GI, palliative medicine Procedures performed: None Disposition: ALF Diet recommendation: Soft diet  DISCHARGE MEDICATION: Allergies as of 03/31/2023   No Known Allergies      Medication List     STOP taking these medications    gabapentin 800 MG tablet Commonly known as: NEURONTIN   omeprazole 40 MG capsule Commonly known as: PRILOSEC       TAKE these medications    amLODipine 10 MG tablet Commonly known as: NORVASC Take 10 mg by mouth daily.   buPROPion 300 MG 24 hr tablet Commonly known as: WELLBUTRIN XL Take 1 tablet (300 mg total) by mouth daily.   LORazepam 0.5 MG tablet Commonly known as: ATIVAN Take 1 tablet (0.5 mg total) by mouth every 6 (six) hours as needed for anxiety. What changed:  when to take this reasons to take this   morphine 15 MG tablet Commonly known as: MSIR Take 1 tablet (15 mg total) by mouth every 6 (six) hours as needed for severe pain.   ondansetron 4 MG tablet Commonly known as: ZOFRAN Take 4 mg by mouth every 6 (six) hours as needed for nausea or vomiting.   pantoprazole 40 MG tablet Commonly known as: PROTONIX Take 1 tablet (40 mg total) by mouth 2 (two) times daily.   primidone 50 MG tablet Commonly known as: MYSOLINE Take 50 mg  by mouth 2 (two) times daily.   senna-docusate 8.6-50 MG tablet Commonly known as: Senokot-S Take 1 tablet by mouth 2 (two) times daily.   sucralfate 1 GM/10ML suspension Commonly known as: CARAFATE Take 10 mLs (1 g total) by mouth 4 (four) times daily -  with meals and at bedtime.   tamsulosin 0.4 MG Caps capsule Commonly known as: FLOMAX Take 0.4 mg by mouth daily.   temazepam 30 MG capsule Commonly known as: RESTORIL Take 30 mg by mouth at bedtime.               Discharge Care Instructions  (From admission, onward)           Start     Ordered   03/31/23 0000  If the dressing is still on your incision site when you go home, remove it on the third day after your surgery date. Remove dressing if it begins to fall off, or if it is dirty or damaged before the third day.        03/31/23 1509            Follow-up Information     [name], NP.  Schedule an appointment as soon as possible for a visit in 2 week(s).   Specialty: Nurse Practitioner Why: for hospital follow-up Contact information: [address] [place] [address] [phone]                Discharge Exam: Ceasar Mons Weights   03/30/23 0728  Weight: 84.6 kg   S: No acute complaints, no repeat episodes of hematemesis during hospitalization  BP (!) 140/64 (BP Location: Left Arm)   Pulse 85   Temp 98.7 F (37.1 C) (Oral)   Resp 18   Ht 5\' 5"  (1.651 m)   Wt 84.6 kg   SpO2 100%   BMI 31.04 kg/m   Physical Exam General: Alert and oriented x 3, NAD Cardiovascular: S1 S2 clear, RRR.  Respiratory: CTAB, no wheezing, rales or rhonchi Gastrointestinal: Soft, nontender, nondistended, NBS Ext: no pedal edema bilaterally Neuro: no new deficits Psych: Normal affect   Condition at discharge: poor  The results of significant diagnostics from this hospitalization (including imaging, microbiology, ancillary and laboratory) are listed below for reference.   Imaging Studies: DG Chest  Port 1 View  Result Date: 03/30/2023 CLINICAL DATA:  Vomiting EXAM: PORTABLE CHEST 1 VIEW COMPARISON:  Chest radiograph dated 12/26/2022 FINDINGS: Low lung volumes with bronchovascular crowding. Left basilar linear and dense opacities. Right basilar patchy opacities. No pleural effusion or pneumothorax. The heart size and mediastinal contours are within normal limits. No acute osseous abnormality. IMPRESSION: Low lung volumes with bibasilar opacities, likely atelectasis. Electronically Signed   By: Agustin Cree M.D.   On: 03/30/2023 08:57    Microbiology: Results for orders placed or performed during the hospital encounter of 07/29/19  Respiratory Panel by RT PCR (Flu A&B, Covid) - Nasopharyngeal Swab     Status: None   Collection Time: 07/29/19  6:25 PM   Specimen: Nasopharyngeal Swab  Result Value Ref Range Status   SARS Coronavirus 2 by RT PCR NEGATIVE NEGATIVE Final    Comment: (NOTE) SARS-CoV-2 target nucleic acids are NOT DETECTED. The SARS-CoV-2 RNA is generally detectable in upper respiratoy specimens during the acute phase of infection. The lowest concentration of SARS-CoV-2 viral copies this assay can detect is 131 copies/mL. A negative result does not preclude SARS-Cov-2 infection and should not be used as the sole basis for treatment or other patient management decisions. A negative result may occur with  improper specimen collection/handling, submission of specimen other than nasopharyngeal swab, presence of viral mutation(s) within the areas targeted by this assay, and inadequate number of viral copies (<131 copies/mL). A negative result must be combined with clinical observations, patient history, and epidemiological information. The expected result is Negative. Fact Sheet for Patients:  https://www.moore.com/ Fact Sheet for Healthcare Providers:  https://www.young.biz/ This test is not yet ap proved or cleared by the [place] FDA  and  has been authorized for detection and/or diagnosis of SARS-CoV-2 by FDA under an Emergency Use Authorization (EUA). This EUA will remain  in effect (meaning this test can be used) for the duration of the COVID-19 declaration under Section 564(b)(1) of the Act, 21 U.S.C. section 360bbb-3(b)(1), unless the authorization is terminated or revoked sooner.    Influenza A by PCR NEGATIVE NEGATIVE Final   Influenza B by PCR NEGATIVE NEGATIVE Final    Comment: (NOTE) The Xpert Xpress SARS-CoV-2/FLU/RSV assay is intended as an aid in  the diagnosis of influenza from Nasopharyngeal swab specimens and  should not be used as a sole basis for treatment. Nasal washings  and  aspirates are unacceptable for Xpert Xpress SARS-CoV-2/FLU/RSV  testing. Fact Sheet for Patients: https://www.moore.com/ Fact Sheet for Healthcare Providers: https://www.young.biz/ This test is not yet approved or cleared by the [place] FDA and  has been authorized for detection and/or diagnosis of SARS-CoV-2 by  FDA under an Emergency Use Authorization (EUA). This EUA will remain  in effect (meaning this test can be used) for the duration of the  Covid-19 declaration under Section 564(b)(1) of the Act, 21  U.S.C. section 360bbb-3(b)(1), unless the authorization is  terminated or revoked. Performed at [facility], [address] [place], [place] [address]   Surgical pcr screen     Status: Abnormal   Collection Time: 07/30/19  2:04 AM   Specimen: Nasal Mucosa; Nasal Swab  Result Value Ref Range Status   MRSA, PCR NEGATIVE NEGATIVE Final   Staphylococcus aureus POSITIVE (A) NEGATIVE Final    Comment: (NOTE) The Xpert SA Assay (FDA approved for NASAL specimens in patients 43 years of age and older), is one component of a comprehensive surveillance program. It is not intended to diagnose infection nor to guide or monitor treatment. Performed at [facility], [address], [place], [place] [address]     Labs: CBC: Recent Labs  Lab 03/30/23 0735 03/31/23 0426  WBC 13.2* 8.1  NEUTROABS 11.6*  --   HGB 6.5* 7.5*  HCT 22.6* 24.4*  MCV 80.7 81.3  PLT 583* 282   Basic Metabolic Panel: Recent Labs  Lab 03/30/23 0735 03/31/23 0426  NA 136 135  K 4.4 3.7  CL 93* 101  CO2 31 28  GLUCOSE 247* 126*  BUN 39* 40*  CREATININE 1.48* 1.27*  CALCIUM 8.5* 7.9*   Liver Function Tests: Recent Labs  Lab 03/30/23 0735  AST 12*  ALT 10  ALKPHOS 71  BILITOT 0.3  PROT 5.6*  ALBUMIN 2.5*   CBG: Recent Labs  Lab 03/30/23 0735  GLUCAP 219*    Discharge time spent: greater than 30 minutes.  Signed: Thad Ranger, MD [facility] 03/31/2023

## 2023-03-31 NOTE — Consult Note (Addendum)
Consultation  Referring Provider: TRH/Rai Primary Care Physician:  [name], NP Primary Gastroenterologist:  Dr.Danis  Reason for Consultation: Coffee-ground emesis  HPI: [name] is a 89 y.o. 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 Myrtie Neither for complaints of epigastric pain.  He was found to have a nonbleeding cratered ulcer in the anterior wall near the prepylorus measuring 20 x 25 mm.  Biopsies confirmed malignancy as above, no H. pylori.  Staging imaging was negative for evidence of any distant metastases or nodal metastases. Patient was referred to ecology/Dr. Mosetta Putt.  Patient ultimately declined any treatment including surgery radiation and chemotherapy. He is currently a resident of assisted living/[facility] and had been followed by palliative care/hospice.  Was brought to the emergency room last night after he had 3 episodes of what was described as coffee-ground emesis yesterday.  He says he had 2 episodes yesterday morning, and 1 episode at lunchtime, fairly large volume, there was no bright red blood.  He is not aware of having any melena or hematochezia over the past several days.  He has had mild abdominal discomfort, no significant or severe abdominal pain and that has not changed recently.  He says his appetite is not good and he does not generally consume much p.o.  On admit found to have hemoglobin 6.5/hematocrit 22.6 WBC 13.2 BUN 39/creatinine 1.48 Lactic 2.8 LFTs within normal limits. He has been transfused 1 unit and this morning hemoglobin is 7.5/hematocrit 24.4  Ferritin 13/serum iron 18/TIBC 211/iron sat 9  Patient has been hemodynamically stable since admission, no melena and no further coffee-ground emesis.  He had been seen by GI during an earlier admission in May 2024 and he presented with epigastric and chest discomfort, again no repeat  EGD recommended, and recommended IR consultation if patient should have significant bleeding.  He last had required a transfusion in June.  No recent abdominal imaging.  Other medical problems include hypertension, hyperlipidemia, diabetes mellitus, prior cholecystectomy.   Past Medical History:  Diagnosis Date   Anxiety    Cataract    Depression    Diabetes mellitus    GERD (gastroesophageal reflux disease)    Hyperlipemia    Hypertension     Past Surgical History:  Procedure Laterality Date   CHOLECYSTECTOMY N/A 07/30/2019   Procedure: LAPAROSCOPIC CHOLECYSTECTOMY;  Surgeon: [name], [name], MD;  Location: [facility];  Service: General;  Laterality: N/A;   FRACTURE SURGERY     HERNIA REPAIR      Prior to Admission medications   Medication Sig Start Date End Date Taking? Authorizing Provider  ondansetron (ZOFRAN) 4 MG tablet Take 4 mg by mouth every 6 (six) hours as needed. 02/17/23  Yes [provider]  acetaminophen (TYLENOL) 500 MG tablet Take 500 mg by mouth every 6 (six) hours as needed for mild pain or headache.    [provider]  buPROPion (WELLBUTRIN XL) 300 MG 24 hr tablet Take 1 tablet (300 mg total) by mouth daily. 12/30/22   Shalhoub, Deno Lunger, MD  calcium carbonate (TUMS E-X 750) 750 MG chewable tablet Chew 1 tablet by mouth 3 (three) times daily as needed for heartburn.    [provider]  chlorhexidine (PERIDEX) 0.12 % solution Use as directed 15 mLs in the mouth or throat 2 (two) times daily.    [provider]  ferrous sulfate 325 (65 FE) MG EC tablet Take  1 tablet (325 mg total) by mouth daily with breakfast. 10/24/22   Heilingoetter, [name], [name]  fluticasone (FLONASE) 50 MCG/ACT nasal spray Place 1-2 sprays into both nostrils 2 (two) times daily as needed (for nasal congestion).    [provider]  gabapentin (NEURONTIN) 800 MG tablet Take 800 mg by mouth 3 (three) times daily. 06/19/22   [provider]  L-Methylfolate-Algae-B12-B6 Latrelle Dodrill) 3-90.314-2-35 MG CAPS Take 1 capsule by mouth 2 (two) times daily.    [provider]  loperamide (IMODIUM) 2 MG capsule Take 2 mg by mouth as needed for diarrhea or loose stools.    [provider]  loratadine (CLARITIN) 10 MG tablet Take 10 mg by mouth daily.    [provider]  LORazepam (ATIVAN) 0.5 MG tablet Take 1 tablet (0.5 mg total) by mouth every 6 (six) hours as needed for anxiety. 12/30/22   Shalhoub, Deno Lunger, MD  losartan (COZAAR) 25 MG tablet Take 1 tablet (25 mg total) by mouth daily. 12/30/22   Shalhoub, Deno Lunger, MD  morphine (MSIR) 15 MG tablet Take 1 tablet (15 mg total) by mouth every 6 (six) hours as needed for severe pain. 12/30/22   Shalhoub, Deno Lunger, MD  Multiple Vitamins-Minerals (CENTRUM SILVER 50+MEN) TABS Take 1 tablet by mouth daily with breakfast.    [provider]  Omega-3 Fatty Acids (FISH OIL ULTRA) 1400 MG CAPS Take 1,400 mg by mouth daily.    [provider]  omeprazole (PRILOSEC) 40 MG capsule Take 40 mg by mouth daily before breakfast.    [provider]  pantoprazole (PROTONIX) 40 MG tablet Take 1 tablet (40 mg total) by mouth 2 (two) times daily. 11/15/22 03/30/23  [name], MD  polyethylene glycol (MIRALAX / GLYCOLAX) 17 g packet Take 17 g by mouth daily. 12/22/22   Amin, Ankit C, MD  primidone (MYSOLINE) 50 MG tablet Take 50 mg by mouth 2 (two) times daily.    [provider]  senna-docusate (SENOKOT-S) 8.6-50 MG tablet Take 1 tablet by mouth 2 (two) times daily. 11/17/22   [name], MD  tamsulosin (FLOMAX) 0.4 MG CAPS capsule Take 0.8 mg by mouth at bedtime.    [provider]  temazepam (RESTORIL) 30 MG capsule     [provider]    Current Facility-Administered Medications  Medication Dose Route Frequency Provider Last Rate Last Admin   acetaminophen (TYLENOL) tablet 650 mg  650 mg Oral Q6H PRN Kirby Crigler, Mir M, MD       Or    acetaminophen (TYLENOL) suppository 650 mg  650 mg Rectal Q6H PRN Kirby Crigler, Mir M, MD       albuterol (PROVENTIL) (2.5 MG/3ML) 0.083% nebulizer solution 2.5 mg  2.5 mg Nebulization Q2H PRN Kirby Crigler, Mir M, MD       buPROPion (WELLBUTRIN XL) 24 hr tablet 300 mg  300 mg Oral Daily Kirby Crigler, Mir M, MD   300 mg at 03/31/23 0849   calcium carbonate (TUMS - dosed in mg elemental calcium) chewable tablet 400 mg of elemental calcium  2 tablet Oral TID PRN Maryln Gottron, MD       lactated ringers infusion   Intravenous Continuous Gwyneth Sprout, MD 125 mL/hr at 03/31/23 0947 New Bag at 03/31/23 0947   LORazepam (ATIVAN) tablet 0.5 mg  0.5 mg Oral Q6H PRN Kirby Crigler, Mir M, MD       morphine (MSIR) tablet 15 mg  15 mg Oral Q6H PRN Maryln Gottron, MD  ondansetron (ZOFRAN) tablet 4 mg  4 mg Oral Q6H PRN Kirby Crigler, Mir M, MD       Or   ondansetron [facility]) injection 4 mg  4 mg Intravenous Q6H PRN Kirby Crigler, Mir M, MD       Oral care mouth rinse  15 mL Mouth Rinse PRN Kirby Crigler, Mir M, MD       pantoprazole (PROTONIX) injection 40 mg  40 mg Intravenous Q24H Kirby Crigler, Mir M, MD   40 mg at 03/31/23 0849   sucralfate (CARAFATE) 1 GM/10ML suspension 1 g  1 g Oral TID WC & HS Kirby Crigler, Mir M, MD   1 g at 03/31/23 0850   tamsulosin (FLOMAX) capsule 0.8 mg  0.8 mg Oral QHS Kirby Crigler, Mir M, MD   0.8 mg at 03/30/23 2114   traZODone (DESYREL) tablet 25 mg  25 mg Oral QHS PRN Maryln Gottron, MD        Allergies as of 03/30/2023   (No Known Allergies)    Family History  Problem Relation Age of Onset   Lung cancer Brother    Colon cancer Neg Hx    Esophageal cancer Neg Hx    Rectal cancer Neg Hx    Stomach cancer Neg Hx     Social History   Socioeconomic History   Marital status: Widowed    Spouse name: Not on file   Number of children: 2   Years of education: Not on file   Highest education level: Doctorate  Occupational History   Occupation: retired Actuary professor     Employer: [employer]  Tobacco Use   Smoking status: Never   Smokeless tobacco: Never  Vaping Use   Vaping status: Never Used  Substance and Sexual Activity   Alcohol use: No   Drug use: No   Sexual activity: Not Currently  Other Topics Concern   Not on file  Social History Narrative   Lives in a senior center.  Has 2 children.  Retired Mudlogger at [employer].  Education; PhD.   Social Determinants of Health   Financial Resource Strain: Not on file  Food Insecurity: No Food Insecurity (03/30/2023)   Hunger Vital Sign    Worried About Running Out of Food in the Last Year: Never true    Ran Out of Food in the Last Year: Never true  Transportation Needs: No Transportation Needs (03/30/2023)   PRAPARE - Administrator, Civil Service (Medical): No    Lack of Transportation (Non-Medical): No  Physical Activity: Not on file  Stress: Not on file  Social Connections: Not on file  Intimate Partner Violence: Not At Risk (03/30/2023)   Humiliation, Afraid, Rape, and Kick questionnaire    Fear of Current or Ex-Partner: No    Emotionally Abused: No    Physically Abused: No    Sexually Abused: No    Review of Systems: Pertinent positive and negative review of systems were noted in the above HPI section.  All other review of systems was otherwise negative.   Physical Exam: Vital signs in last 24 hours: Temp:  [97.7 F (36.5 C)-98.9 F (37.2 C)] 98.6 F (37 C) (09/30 2028) Pulse Rate:  [103-111] 105 (09/30 2028) Resp:  [15-20] 18 (09/30 2028) BP: (108-151)/(63-84) 125/63 (09/30 2028) SpO2:  [93 %-100 %] 97 % (09/30 2028)   General:   Alert,  Well-developed, well-nourished very elderly Hispanic male, quite pleasant and cooperative in NAD.  Pale Head:  Normocephalic and atraumatic. Eyes:  Sclera clear, no icterus.   Conjunctiva pale Ears:  Normal auditory acuity. Nose:  No deformity, discharge,  or lesions. Mouth:  No deformity or lesions.   Neck:  Supple; no  masses or thyromegaly. Lungs:  Clear throughout to auscultation.   No wheezes, crackles, or rhonchi.  Heart:  Regular rate and rhythm; no murmurs, clicks, rubs,  or gallops. Abdomen:  Soft, there is mild tenderness in the epigastrium, no guarding or rebound BS active,nonpalp mass or hsm.   Rectal: not done Msk:  Symmetrical without gross deformities. . Pulses:  Normal pulses noted. Extremities:  Without clubbing or edema. Neurologic:  Alert and  oriented x4;  grossly normal neurologically. Skin:  Intact without significant lesions or rashes.. Psych:  Alert and cooperative. Normal mood and affect.  Intake/Output from previous day: 09/30 0701 - 10/01 0700 In: 2668.3 [P.O.:120; I.V.:2180.8; Blood:367.5] Out: -  Intake/Output this shift: Total I/O In: 240 [P.O.:240] Out: -   Lab Results: Recent Labs    03/30/23 0735 03/31/23 0426  WBC 13.2* 8.1  HGB 6.5* 7.5*  HCT 22.6* 24.4*  PLT 583* 282   BMET Recent Labs    03/30/23 0735 03/31/23 0426  NA 136 135  K 4.4 3.7  CL 93* 101  CO2 31 28  GLUCOSE 247* 126*  BUN 39* 40*  CREATININE 1.48* 1.27*  CALCIUM 8.5* 7.9*   LFT Recent Labs    03/30/23 0735  PROT 5.6*  ALBUMIN 2.5*  AST 12*  ALT 10  ALKPHOS 71  BILITOT 0.3   PT/INR No results for input(s): "LABPROT", "INR" in the last 72 hours. Hepatitis Panel No results for input(s): "HEPBSAG", "HCVAB", "HEPAIGM", "HEPBIGM" in the last 72 hours.     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  At that time no evidence of nodal or distant metastases Patient ultimately decided after consultation with oncology not to pursue any treatment, and has been followed by hospice at his assisted living.  He did require a transfusion in May, and another in June. Brought to the emergency room last p.m. after coffee-ground emesis x 3 yesterday. Hemoglobin down 3 g from 9.5-6.5 since June  2024  He has been transfused and hemoglobin 7.5 today, has remained hemodynamically stable and no further emesis, no stools  Almost certainly had an episode of bleeding from the known gastric adenocarcinoma.  Fortunately he does not appear to be having ongoing active bleeding  There is no helpful endoscopic therapy in this situation  for tumor bleeding/oozing.  #2 anemia acute on chronic secondary to GI blood loss #3.  Hypertension #4.  BPH, #5.  Congestive heart failure  Plan; advance to soft diet Okay to continue twice daily PPI No plans for endoscopic intervention as there is no good endoscopic therapy for tumor oozing/bleeding. Patient has previously declined therapy.  If he should have significant active bleeding, could consider IR consultation for CTA, then potential embolization to control bleeding. He has been a hospice patient, need hospice/palliative care follow-up to clarify his desires for any more aggressive management. Continue to follow hemoglobin and transfuse as needed. GI will be available    Amy Esterwood [name] 03/31/2023, 11:07 AM  GI ATTENDING  History, laboratories, x-rays, prior endoscopy report and pathology all personally reviewed.  Patient seen and examined as outlined above.  Agree with comprehensive consultation as outlined above.  Impression: 1.  Large malignant gastric ulcer diagnosed April 2024.  See images from  that endoscopy report.  Untreated. 2.  Intermittent GI bleeding from the large untreated malignant gastric ulcer.  This patient will continue to bleed intermittently to varying degrees.  Could become severe at some point. Recommendations: 1.  The treatment for this problem is surgery. 2.  Unfortunately, there is no role for endoscopy in treating bleeding malignancy.  It is ineffective. 3.  Goals of care need to be defined.  Prognosis poor.  Will sign off.  Wilhemina Bonito. Eda Keys., M.D. [facility] Division of Gastroenterology

## 2023-03-31 NOTE — Progress Notes (Signed)
OT Cancellation Note  Patient Details Name: [name] MRN: [id number] DOB: 1933-04-02   Cancelled Treatment:    Reason Eval/Treat Not Completed: OT screened, no needs identified, will sign off Patient to d/c back to ALF with hospice services per chart review. No skilled OT needs identified. D/c has been placed already at this time.  Rosalio Loud, MS Acute Rehabilitation Department Office# [phone]  03/31/2023, 4:11 PM

## 2023-03-31 NOTE — TOC Transition Note (Signed)
Transition of Care [facility]) - CM/SW Discharge Note   Patient Details  Name: [name] MRN: [id number] Date of Birth: September 24, 1932  [facility] CM/SW Contact:  Larrie Kass, LCSW Phone Number: 03/31/2023, 3:48 PM   Clinical Narrative:  Pt to d/c back to [name] with [facility]. CSW spoke with pt's son he agrees with d/c plan, he is requesting EMS transportation , as he can not get pt into the home. PTAR called. No further TOC needs. TOC sign off.    Final next level of care: Home w Hospice Care Barriers to Discharge: No Barriers Identified   Patient Goals and CMS Choice CMS Medicare.gov Compare Post Acute Care list provided to:: Patient Choice offered to / list presented to : Patient  Discharge Placement                  Patient to be transferred to facility by: EMS Name of family member notified: Rodriguez,Paul (Son)  [phone] [facility]) Patient and family notified of of transfer: 03/31/23  Discharge Plan and Services Additional resources added to the After Visit Summary for                                       Social Determinants of Health (SDOH) Interventions SDOH Screenings   Food Insecurity: No Food Insecurity (03/30/2023)  Housing: Low Risk  (03/30/2023)  Transportation Needs: No Transportation Needs (03/30/2023)  Utilities: Not At Risk (03/30/2023)  Depression (PHQ2-9): Low Risk  (10/10/2022)  Tobacco Use: Low Risk  (03/30/2023)     Readmission Risk Interventions    12/22/2022    1:00 PM  Readmission Risk Prevention Plan  Transportation Screening Complete  PCP or Specialist Appt within 3-5 Days Complete  HRI or Home Care Consult Complete  Social Work Consult for Recovery Care Planning/Counseling Complete  Palliative Care Screening Complete  Medication Review Oceanographer) Complete

## 2023-03-31 NOTE — Plan of Care (Signed)
  Problem: Activity: Goal: Ability to tolerate increased activity will improve Outcome: Progressing   Problem: Nutrition: Goal: Adequate nutrition will be maintained Outcome: Progressing   Problem: Nutritional: Goal: Maintenance of adequate nutrition will improve Outcome: Progressing   Problem: Education: Goal: Understanding of cardiac disease, CV risk reduction, and recovery process will improve Outcome: Adequate for Discharge   Problem: Cardiac: Goal: Ability to achieve and maintain adequate cardiovascular perfusion will improve Outcome: Adequate for Discharge   Problem: Health Behavior/Discharge Planning: Goal: Ability to safely manage health-related needs after discharge will improve Outcome: Adequate for Discharge   Problem: Education: Goal: Knowledge of General Education information will improve Description: Including pain rating scale, medication(s)/side effects and non-pharmacologic comfort measures Outcome: Adequate for Discharge   Problem: Health Behavior/Discharge Planning: Goal: Ability to manage health-related needs will improve Outcome: Adequate for Discharge   Problem: Clinical Measurements: Goal: Ability to maintain clinical measurements within normal limits will improve Outcome: Adequate for Discharge Goal: Will remain free from infection Outcome: Adequate for Discharge Goal: Diagnostic test results will improve Outcome: Adequate for Discharge Goal: Cardiovascular complication will be avoided Outcome: Adequate for Discharge   Problem: Activity: Goal: Risk for activity intolerance will decrease Outcome: Adequate for Discharge   Problem: Coping: Goal: Level of anxiety will decrease Outcome: Adequate for Discharge   Problem: Elimination: Goal: Will not experience complications related to bowel motility Outcome: Adequate for Discharge   Problem: Safety: Goal: Ability to remain free from injury will improve Outcome: Adequate for Discharge    Problem: Skin Integrity: Goal: Risk for impaired skin integrity will decrease Outcome: Adequate for Discharge   Problem: Coping: Goal: Ability to adjust to condition or change in health will improve Outcome: Adequate for Discharge   Problem: Fluid Volume: Goal: Ability to maintain a balanced intake and output will improve Outcome: Adequate for Discharge   Problem: Metabolic: Goal: Ability to maintain appropriate glucose levels will improve Outcome: Adequate for Discharge   Problem: Skin Integrity: Goal: Risk for impaired skin integrity will decrease Outcome: Adequate for Discharge   Problem: Tissue Perfusion: Goal: Adequacy of tissue perfusion will improve Outcome: Adequate for Discharge

## 2023-03-31 NOTE — Progress Notes (Addendum)
PT Cancellation Note  Patient Details Name: [name] MRN: [id number] DOB: Aug 10, 1932   Cancelled Treatment:    Reason Eval/Treat Not Completed: PT screened, no needs identified, will sign off Order received. PT eval not warranted. Plan is for pt to return to ALF-already has hospice care. DC Summary in chart. Will sign off.    Faye Ramsay, PT Acute Rehabilitation  Office: [phone]

## 2023-03-31 NOTE — Plan of Care (Signed)
  Problem: Activity: Goal: Ability to tolerate increased activity will improve Outcome: Progressing   Problem: Clinical Measurements: Goal: Ability to maintain clinical measurements within normal limits will improve Outcome: Progressing   Problem: Activity: Goal: Risk for activity intolerance will decrease Outcome: Progressing   Problem: Elimination: Goal: Will not experience complications related to bowel motility Outcome: Progressing   Problem: Safety: Goal: Ability to remain free from injury will improve Outcome: Progressing

## 2023-05-14 ENCOUNTER — Inpatient Hospital Stay (HOSPITAL_COMMUNITY)
Admission: EM | Admit: 2023-05-14 | Discharge: 2023-05-31 | DRG: 951 | Disposition: E | Source: Skilled Nursing Facility | Attending: Family Medicine | Admitting: Family Medicine

## 2023-05-14 ENCOUNTER — Encounter (HOSPITAL_COMMUNITY): Payer: Self-pay

## 2023-05-14 DIAGNOSIS — K922 Gastrointestinal hemorrhage, unspecified: Principal | ICD-10-CM | POA: Diagnosis present

## 2023-05-14 DIAGNOSIS — I10 Essential (primary) hypertension: Secondary | ICD-10-CM | POA: Diagnosis present

## 2023-05-14 DIAGNOSIS — D62 Acute posthemorrhagic anemia: Secondary | ICD-10-CM | POA: Diagnosis not present

## 2023-05-14 DIAGNOSIS — Z66 Do not resuscitate: Secondary | ICD-10-CM | POA: Diagnosis not present

## 2023-05-14 DIAGNOSIS — E1122 Type 2 diabetes mellitus with diabetic chronic kidney disease: Secondary | ICD-10-CM | POA: Insufficient documentation

## 2023-05-14 DIAGNOSIS — N179 Acute kidney failure, unspecified: Secondary | ICD-10-CM | POA: Insufficient documentation

## 2023-05-14 DIAGNOSIS — R579 Shock, unspecified: Secondary | ICD-10-CM

## 2023-05-14 DIAGNOSIS — C169 Malignant neoplasm of stomach, unspecified: Secondary | ICD-10-CM | POA: Diagnosis present

## 2023-05-14 DIAGNOSIS — N400 Enlarged prostate without lower urinary tract symptoms: Secondary | ICD-10-CM | POA: Diagnosis present

## 2023-05-14 DIAGNOSIS — E861 Hypovolemia: Secondary | ICD-10-CM

## 2023-05-14 DIAGNOSIS — K920 Hematemesis: Secondary | ICD-10-CM | POA: Diagnosis present

## 2023-05-14 DIAGNOSIS — R54 Age-related physical debility: Secondary | ICD-10-CM | POA: Diagnosis present

## 2023-05-14 DIAGNOSIS — Z79899 Other long term (current) drug therapy: Secondary | ICD-10-CM

## 2023-05-14 DIAGNOSIS — H9190 Unspecified hearing loss, unspecified ear: Secondary | ICD-10-CM | POA: Diagnosis present

## 2023-05-14 DIAGNOSIS — I9589 Other hypotension: Secondary | ICD-10-CM | POA: Diagnosis present

## 2023-05-14 DIAGNOSIS — F419 Anxiety disorder, unspecified: Secondary | ICD-10-CM | POA: Diagnosis present

## 2023-05-14 DIAGNOSIS — R571 Hypovolemic shock: Secondary | ICD-10-CM | POA: Diagnosis present

## 2023-05-14 DIAGNOSIS — Z515 Encounter for palliative care: Principal | ICD-10-CM

## 2023-05-14 DIAGNOSIS — D72828 Other elevated white blood cell count: Secondary | ICD-10-CM | POA: Diagnosis present

## 2023-05-14 DIAGNOSIS — I129 Hypertensive chronic kidney disease with stage 1 through stage 4 chronic kidney disease, or unspecified chronic kidney disease: Secondary | ICD-10-CM | POA: Diagnosis present

## 2023-05-14 DIAGNOSIS — N189 Chronic kidney disease, unspecified: Secondary | ICD-10-CM | POA: Diagnosis present

## 2023-05-14 HISTORY — DX: Malignant (primary) neoplasm, unspecified: C80.1

## 2023-05-14 LAB — CBC WITH DIFFERENTIAL/PLATELET
Abs Immature Granulocytes: 0.07 10*3/uL (ref 0.00–0.07)
Basophils Absolute: 0 10*3/uL (ref 0.0–0.1)
Basophils Relative: 0 %
Eosinophils Absolute: 0 10*3/uL (ref 0.0–0.5)
Eosinophils Relative: 0 %
HCT: 19.2 % — ABNORMAL LOW (ref 39.0–52.0)
Hemoglobin: 5.3 g/dL — CL (ref 13.0–17.0)
Immature Granulocytes: 1 %
Lymphocytes Relative: 5 %
Lymphs Abs: 0.8 10*3/uL (ref 0.7–4.0)
MCH: 19.3 pg — ABNORMAL LOW (ref 26.0–34.0)
MCHC: 27.6 g/dL — ABNORMAL LOW (ref 30.0–36.0)
MCV: 69.8 fL — ABNORMAL LOW (ref 80.0–100.0)
Monocytes Absolute: 0.6 10*3/uL (ref 0.1–1.0)
Monocytes Relative: 4 %
Neutro Abs: 14 10*3/uL — ABNORMAL HIGH (ref 1.7–7.7)
Neutrophils Relative %: 90 %
Platelets: 694 10*3/uL — ABNORMAL HIGH (ref 150–400)
RBC: 2.75 MIL/uL — ABNORMAL LOW (ref 4.22–5.81)
RDW: 22.5 % — ABNORMAL HIGH (ref 11.5–15.5)
WBC: 15.5 10*3/uL — ABNORMAL HIGH (ref 4.0–10.5)
nRBC: 0 % (ref 0.0–0.2)

## 2023-05-14 LAB — COMPREHENSIVE METABOLIC PANEL
ALT: 13 U/L (ref 0–44)
AST: 17 U/L (ref 15–41)
Albumin: 2.3 g/dL — ABNORMAL LOW (ref 3.5–5.0)
Alkaline Phosphatase: 78 U/L (ref 38–126)
Anion gap: 22 — ABNORMAL HIGH (ref 5–15)
BUN: 26 mg/dL — ABNORMAL HIGH (ref 8–23)
CO2: 29 mmol/L (ref 22–32)
Calcium: 8.4 mg/dL — ABNORMAL LOW (ref 8.9–10.3)
Chloride: 87 mmol/L — ABNORMAL LOW (ref 98–111)
Creatinine, Ser: 1.91 mg/dL — ABNORMAL HIGH (ref 0.61–1.24)
GFR, Estimated: 33 mL/min — ABNORMAL LOW (ref >60)
Glucose, Bld: 224 mg/dL — ABNORMAL HIGH (ref 70–99)
Potassium: 3.8 mmol/L (ref 3.5–5.1)
Sodium: 138 mmol/L (ref 135–145)
Total Bilirubin: 0.8 mg/dL (ref <1.2)
Total Protein: 5.5 g/dL — ABNORMAL LOW (ref 6.5–8.1)

## 2023-05-14 LAB — PREPARE RBC (CROSSMATCH)

## 2023-05-14 LAB — PROTIME-INR
INR: 1.1 (ref 0.8–1.2)
Prothrombin Time: 14.7 s (ref 11.4–15.2)

## 2023-05-14 LAB — POC OCCULT BLOOD, ED: Fecal Occult Bld: POSITIVE — AB

## 2023-05-14 LAB — LIPASE, BLOOD: Lipase: 25 U/L (ref 11–51)

## 2023-05-14 LAB — ABO/RH: ABO/RH(D): A POS

## 2023-05-14 MED ORDER — ONDANSETRON HCL 4 MG/2ML IJ SOLN
4.0000 mg | Freq: Four times a day (QID) | INTRAMUSCULAR | Status: DC | PRN
  Administered 2023-05-14 – 2023-05-15 (×2): 4 mg via INTRAVENOUS
  Filled 2023-05-14 (×2): qty 2

## 2023-05-14 MED ORDER — ACETAMINOPHEN 325 MG PO TABS: 650.0000 mg | ORAL_TABLET | Freq: Four times a day (QID) | ORAL | Status: DC | PRN

## 2023-05-14 MED ORDER — ACETAMINOPHEN 650 MG RE SUPP: 650.0000 mg | Freq: Four times a day (QID) | RECTAL | Status: DC | PRN

## 2023-05-14 MED ORDER — FENTANYL CITRATE PF 50 MCG/ML IJ SOSY
50.0000 ug | PREFILLED_SYRINGE | Freq: Once | INTRAMUSCULAR | Status: AC
  Administered 2023-05-14: 50 ug via INTRAVENOUS
  Filled 2023-05-14: qty 1

## 2023-05-14 MED ORDER — SODIUM CHLORIDE 0.9% IV SOLUTION: Freq: Once | INTRAVENOUS | Status: DC

## 2023-05-14 MED ORDER — PANTOPRAZOLE SODIUM 40 MG IV SOLR
80.0000 mg | Freq: Once | INTRAVENOUS | Status: AC
  Administered 2023-05-14: 80 mg via INTRAVENOUS
  Filled 2023-05-14: qty 20

## 2023-05-14 MED ORDER — MELATONIN 3 MG PO TABS: 3.0000 mg | ORAL_TABLET | Freq: Every evening | ORAL | Status: DC | PRN

## 2023-05-14 MED ORDER — SODIUM CHLORIDE 0.9 % IV BOLUS
1000.0000 mL | Freq: Once | INTRAVENOUS | Status: AC
  Administered 2023-05-14: 1000 mL via INTRAVENOUS

## 2023-05-14 MED ORDER — ONDANSETRON HCL 4 MG PO TABS: 4.0000 mg | ORAL_TABLET | Freq: Four times a day (QID) | ORAL | Status: DC | PRN

## 2023-05-14 MED ORDER — FENTANYL CITRATE PF 50 MCG/ML IJ SOSY
50.0000 ug | PREFILLED_SYRINGE | INTRAMUSCULAR | Status: DC | PRN
  Administered 2023-05-14 – 2023-05-18 (×16): 50 ug via INTRAVENOUS
  Filled 2023-05-14 (×16): qty 1

## 2023-05-14 MED ORDER — ACETAMINOPHEN 10 MG/ML IV SOLN
1000.0000 mg | Freq: Once | INTRAVENOUS | Status: AC
  Administered 2023-05-14: 1000 mg via INTRAVENOUS
  Filled 2023-05-14: qty 100

## 2023-05-14 NOTE — ED Notes (Signed)
Called out for pt. Decreased bp 68/50, RN made aware.

## 2023-05-14 NOTE — ED Provider Notes (Signed)
Emergency Department Provider Note   I have reviewed the triage vital signs and the nursing notes.   HISTORY  Chief Complaint Altered Mental Status and Hypotension   HPI Elex Montague is a 89 y.o. male past history of stomach ulcer, currently on hospice care, presents to the emergency department with vomiting, lethargy, hypotension.  He is coming from [facility] independent living where he stays with an aide.  EMS report sudden deterioration in his mental status and blood pressure today.  He had had some dark emesis and complaining of abdominal pain.  Patient is currently under hospice care.  I confirmed this with his son and POA, Renae Fickle, who confirms DNR/DNI wishes. He would like to focus on comfort at this time.   Level 5 caveat: AMS  Past Medical History:  Diagnosis Date   Cancer (HCC)     Review of Systems  Level 5 caveat: AMS  ____________________________________________   PHYSICAL EXAM:  VITAL SIGNS: ED Triage Vitals [05/14/23 1754]  Encounter Vitals Group     BP (!) 74/51     Systolic BP Percentile      Diastolic BP Percentile      Pulse Rate (!) 110     Resp 18     Temp (!) 97.3 F (36.3 C)     Temp Source Oral     SpO2 90 %   Constitutional: Somnolent but arouses to voice and touch. Complains of abdominal pain but confused.  Eyes: Conjunctivae are normal.  Head: Atraumatic. Nose: No congestion/rhinnorhea. Mouth/Throat: Mucous membranes are slightly dry.  Neck: No stridor.   Cardiovascular: tachycardia. Good peripheral circulation. Grossly normal heart sounds.   Respiratory: Normal respiratory effort.  No retractions. Lungs CTAB. Gastrointestinal: Soft and nontender. No distention.  Musculoskeletal: No gross deformities of extremities. Neurologic:  Normal speech and language.  Skin:  Skin is warm, dry and intact. No rash noted.   ____________________________________________   LABS (all labs ordered are listed, but only abnormal  results are displayed)  Labs Reviewed  COMPREHENSIVE METABOLIC PANEL - Abnormal; Notable for the following components:      Result Value   Chloride 87 (*)    Glucose, Bld 224 (*)    BUN 26 (*)    Creatinine, Ser 1.91 (*)    Calcium 8.4 (*)    Total Protein 5.5 (*)    Albumin 2.3 (*)    GFR, Estimated 33 (*)    Anion gap 22 (*)    All other components within normal limits  CBC WITH DIFFERENTIAL/PLATELET - Abnormal; Notable for the following components:   WBC 15.5 (*)    RBC 2.75 (*)    Hemoglobin 5.3 (*)    HCT 19.2 (*)    MCV 69.8 (*)    MCH 19.3 (*)    MCHC 27.6 (*)    RDW 22.5 (*)    Platelets 694 (*)    Neutro Abs 14.0 (*)    All other components within normal limits  POC OCCULT BLOOD, ED - Abnormal; Notable for the following components:   Fecal Occult Bld POSITIVE (*)    All other components within normal limits  LIPASE, BLOOD  PROTIME-INR  BASIC METABOLIC PANEL  CBC  TYPE AND SCREEN  PREPARE RBC (CROSSMATCH)  ABO/RH  PREPARE RBC (CROSSMATCH)   ____________________________________________   PROCEDURES  Procedure(s) performed:   Procedures  CRITICAL CARE Performed by: Maia Plan Total critical care time: 35 minutes Critical care time was exclusive of separately billable  procedures and treating other patients. Critical care was necessary to treat or prevent imminent or life-threatening deterioration. Critical care was time spent personally by me on the following activities: development of treatment plan with patient and/or surrogate as well as nursing, discussions with consultants, evaluation of patient's response to treatment, examination of patient, obtaining history from patient or surrogate, ordering and performing treatments and interventions, ordering and review of laboratory studies, ordering and review of radiographic studies, pulse oximetry and re-evaluation of patient's condition.  Alona Bene, MD Emergency  Medicine  ____________________________________________   INITIAL IMPRESSION / ASSESSMENT AND PLAN / ED COURSE  Pertinent labs & imaging results that were available during my care of the patient were reviewed by me and considered in my medical decision making (see chart for details).   This patient is Presenting for Evaluation of AMS, which does require a range of treatment options, and is a complaint that involves a high risk of morbidity and mortality.  The Differential Diagnoses includes but is not exclusive to acute cholecystitis, intrathoracic causes for epigastric abdominal pain, gastritis, duodenitis, pancreatitis, small bowel or large bowel obstruction, abdominal aortic aneurysm, hernia, gastritis, etc.   Critical Interventions-    Medications  fentaNYL (SUBLIMAZE) injection 50 mcg (has no administration in time range)  0.9 %  sodium chloride infusion (Manually program via Guardrails IV Fluids) (has no administration in time range)  0.9 %  sodium chloride infusion (Manually program via Guardrails IV Fluids) (has no administration in time range)  ondansetron (ZOFRAN) tablet 4 mg ( Oral See Alternative 05/14/23 2153)    Or  ondansetron (ZOFRAN) injection 4 mg (4 mg Intravenous Given 05/14/23 2153)  melatonin tablet 3 mg (has no administration in time range)  acetaminophen (TYLENOL) tablet 650 mg (has no administration in time range)    Or  acetaminophen (TYLENOL) suppository 650 mg (has no administration in time range)  sodium chloride 0.9 % bolus 1,000 mL (0 mLs Intravenous Stopped 05/14/23 1932)  fentaNYL (SUBLIMAZE) injection 50 mcg (50 mcg Intravenous Given 05/14/23 1801)  pantoprazole (PROTONIX) injection 80 mg (80 mg Intravenous Given 05/14/23 1811)  acetaminophen (OFIRMEV) IV 1,000 mg (1,000 mg Intravenous New Bag/Given 05/14/23 2153)    Reassessment after intervention: BP improved. Patient is comfortable.    I did obtain Additional Historical Information from EMS and  Son by phone, as the patient is altered.  I decided to review pertinent External Data, and in summary no records in our system but patient has been a patient here. Asking for registration to look for other charts.   Clinical Laboratory Tests Ordered, included CBC with hemoglobin of 5.3.  Leukocytosis to 15.  Creatinine 1.91 and Hemoccult positive.  Radiologic Tests: Considered abdominal imaging but patient's goals of care discussed with family and will defer.   Cardiac Monitor Tracing which shows tachycardia.   Consult complete with [facility]. Plan for admit.   Medical Decision Making: Summary:  Patient arrives to the emergency department hypotensive with vomiting and altered mental status.  According to EMS and in conversation with his son by phone the patient is currently enrolled in hospice and wishes to not undergo heroic measures such as intubation, pressors, central access. Acute decompensation at independent living facility and resources not available to assist with hospice measures at the facility.   Reevaluation with update and discussion with patient and son at bedside. BP improved with IVF. PRBC coming from blood bank.  Patient's presentation is most consistent with acute presentation with potential threat to life or  bodily function.   Disposition: admit  ____________________________________________  FINAL CLINICAL IMPRESSION(S) / ED DIAGNOSES  Final diagnoses:  Gastrointestinal hemorrhage, unspecified gastrointestinal hemorrhage type  Hypotension due to hypovolemia    Note:  This document was prepared using Dragon voice recognition software and may include unintentional dictation errors.  Alona Bene, MD, [facility] Emergency Medicine    Sharetta Ricchio, Arlyss Repress, MD 05/14/23 2215

## 2023-05-14 NOTE — H&P (Signed)
History and Physical    Patient: [name] ZOX:[id number] DOB: 12-10-1932 DOA: 05/14/2023 DOS: the patient was seen and examined on 05/14/2023 PCP: Patient, No Pcp Per  Patient coming from: ALF/ILF  Chief Complaint:  Chief Complaint  Patient presents with   Altered Mental Status   Hypotension   HPI: [name] is a 89 y.o. male with medical history significant for gastric cancer diagnosed 09/2022.  Patient elected for no treatment and he is currently in hospice.  He lives in independent living with a 24-hour aides.  My history comes from the ED provider.  Apparently today he started vomiting green liquid.  Had several episodes so EMS was called.  On arrival the patient's blood pressure was in the 60s systolic.  He has had large amounts of coffee-ground emesis since he has been here in the emergency department.  With IV fluids and the start of a transfusion the patient's blood pressure has stabilized.  With the significant hypotension on arrival was not clear the patient was going to survive long.  The patient's son is present and his daughter is coming in from out of state. The patient says "I am not doing well".  His only complaint of pain is in his left foot. He is pleasant and talkative.   Review of Systems: As mentioned in the history of present illness. All other systems reviewed and are negative. Past Medical History:  Diagnosis Date   Cancer [facility])    History reviewed. No pertinent surgical history. Social History:  has no history on file for tobacco use, alcohol use, and drug use.  No Known Allergies  History reviewed. No pertinent family history.  Prior to Admission medications   Medication Sig Start Date End Date Taking? Authorizing Provider  furosemide (LASIX) 20 MG tablet Take 20 mg by mouth daily.   Yes [provider]    Physical Exam: Vitals:   05/14/23 2045 05/14/23 2050 05/14/23 2100 05/14/23 2115  BP: 102/70 (!) 88/69  100/89 111/69  Pulse: (!) 110 (!) 110  (!) 106  Resp: (!) 27 (!) 27 (!) 22 (!) 21  Temp:  97.7 F (36.5 C)    TempSrc:  Oral    SpO2: 94% 96%  98%   Physical Exam:  General: pale, frail HEENT: Normocephalic, atraumatic, PER pinpoint, missing teeth, dry mucous membranes Cardiovascular: Normal rate and rhythm. Distal pulses on right foot palpable. Not palpable on let foot. Pulmonary: Normal pulmonary effort, normal breath sounds Gastrointestinal: Nondistended scaphoid abdomen, soft, non-tender, hypoactive bowel sounds Musculoskeletal:No lower ext edema, left foot deformed  Skin: Skin is warm and dry. Neuro: moves all extremities purposefully, AAOx3, moves slowly, hard of hearing PSYCH: Attentive and cooperative  Data Reviewed:  Results for orders placed or performed during the hospital encounter of 05/14/23 (from the past 24 hour(s))  Comprehensive metabolic panel     Status: Abnormal   Collection Time: 05/14/23  5:55 PM  Result Value Ref Range   Sodium 138 135 - 145 mmol/L   Potassium 3.8 3.5 - 5.1 mmol/L   Chloride 87 (L) 98 - 111 mmol/L   CO2 29 22 - 32 mmol/L   Glucose, Bld 224 (H) 70 - 99 mg/dL   BUN 26 (H) 8 - 23 mg/dL   Creatinine, Ser 8.11 (H) 0.61 - 1.24 mg/dL   Calcium 8.4 (L) 8.9 - 10.3 mg/dL   Total Protein 5.5 (L) 6.5 - 8.1 g/dL   Albumin 2.3 (L) 3.5 - 5.0 g/dL  AST 17 15 - 41 U/L   ALT 13 0 - 44 U/L   Alkaline Phosphatase 78 38 - 126 U/L   Total Bilirubin 0.8 <1.2 mg/dL   GFR, Estimated 33 (L) >60 mL/min   Anion gap 22 (H) 5 - 15  Lipase, blood     Status: None   Collection Time: 05/14/23  5:55 PM  Result Value Ref Range   Lipase 25 11 - 51 U/L  CBC with Differential     Status: Abnormal   Collection Time: 05/14/23  5:55 PM  Result Value Ref Range   WBC 15.5 (H) 4.0 - 10.5 K/uL   RBC 2.75 (L) 4.22 - 5.81 MIL/uL   Hemoglobin 5.3 (LL) 13.0 - 17.0 g/dL   HCT 65.7 (L) 84.6 - 96.2 %   MCV 69.8 (L) 80.0 - 100.0 fL   MCH 19.3 (L) 26.0 - 34.0 pg   MCHC 27.6  (L) 30.0 - 36.0 g/dL   RDW 95.2 (H) 84.1 - 32.4 %   Platelets 694 (H) 150 - 400 K/uL   nRBC 0.0 0.0 - 0.2 %   Neutrophils Relative % 90 %   Neutro Abs 14.0 (H) 1.7 - 7.7 K/uL   Lymphocytes Relative 5 %   Lymphs Abs 0.8 0.7 - 4.0 K/uL   Monocytes Relative 4 %   Monocytes Absolute 0.6 0.1 - 1.0 K/uL   Eosinophils Relative 0 %   Eosinophils Absolute 0.0 0.0 - 0.5 K/uL   Basophils Relative 0 %   Basophils Absolute 0.0 0.0 - 0.1 K/uL   Immature Granulocytes 1 %   Abs Immature Granulocytes 0.07 0.00 - 0.07 K/uL   Polychromasia PRESENT    Target Cells PRESENT   Protime-INR     Status: None   Collection Time: 05/14/23  5:55 PM  Result Value Ref Range   Prothrombin Time 14.7 11.4 - 15.2 seconds   INR 1.1 0.8 - 1.2  Type and screen [facility]     Status: None (Preliminary result)   Collection Time: 05/14/23  5:55 PM  Result Value Ref Range   ABO/RH(D) A POS    Antibody Screen NEG    Sample Expiration 05/17/2023,2359    Unit Number [id number]    Blood Component Type RED CELLS,LR    Unit division 00    Status of Unit ISSUED    Transfusion Status OK TO TRANSFUSE    Crossmatch Result      Compatible Performed at [facility], [address], [place], [place] [address]   POC occult blood, ED     Status: Abnormal   Collection Time: 05/14/23  6:13 PM  Result Value Ref Range   Fecal Occult Bld POSITIVE (A) NEGATIVE  Prepare RBC (crossmatch)     Status: None   Collection Time: 05/14/23  6:44 PM  Result Value Ref Range   Order Confirmation      ORDER PROCESSED BY BLOOD BANK Performed at [facility], [address] [place], [place] [address]   ABO/Rh     Status: None   Collection Time: 05/14/23  7:55 PM  Result Value Ref Range   ABO/RH(D)      A POS Performed at [facility], [address] [place], [place] [address]      Assessment and Plan: Gastric cancer, no treatment, in hospice Recurrent  upper GI bleed Hypovolemic shock - resolved DNR/DNI  -Transfuse - IV Protonix - monitor. Prognosis is guarded. - Symptomatic  care - He will likely need a higher level of care than independent living at discharge if he survives  - Family is aware of poor prognosis and daughter is on her way here from [place]. - Unclear if hospice has been contacted.   Advance Care Planning:   Code Status: Limited: Do not attempt resuscitation (DNR) -DNR-LIMITED -Do Not Intubate/DNI per discussion documented by the ED physician with the patient and patient's son.  Consults: none  Family Communication: Son at bedside  Severity of Illness: The appropriate patient status for this patient is INPATIENT. Inpatient status is judged to be reasonable and necessary in order to provide the required intensity of service to ensure the patient's safety. The patient's presenting symptoms, physical exam findings, and initial radiographic and laboratory data in the context of their chronic comorbidities is felt to place them at high risk for further clinical deterioration. Furthermore, it is not anticipated that the patient will be medically stable for discharge from the hospital within 2 midnights of admission.   * I certify that at the point of admission it is my clinical judgment that the patient will require inpatient hospital care spanning beyond 2 midnights from the point of admission due to high intensity of service, high risk for further deterioration and high frequency of surveillance required.*  Author: Buena Irish, MD 05/14/2023 9:25 PM  For on call review [email or web address].

## 2023-05-14 NOTE — ED Notes (Signed)
Called out pt. Decreased BP, 74/54, RN made aware.

## 2023-05-14 NOTE — ED Triage Notes (Signed)
Per EMS, Pt, from [facility], c/o abdominal pain and n/v.  Hx of stomach CA.    Hospice staff reported he had been vomiting "black stuff."

## 2023-05-15 DIAGNOSIS — E1122 Type 2 diabetes mellitus with diabetic chronic kidney disease: Secondary | ICD-10-CM | POA: Insufficient documentation

## 2023-05-15 DIAGNOSIS — N179 Acute kidney failure, unspecified: Secondary | ICD-10-CM | POA: Insufficient documentation

## 2023-05-15 DIAGNOSIS — D62 Acute posthemorrhagic anemia: Secondary | ICD-10-CM

## 2023-05-15 DIAGNOSIS — I10 Essential (primary) hypertension: Secondary | ICD-10-CM

## 2023-05-15 DIAGNOSIS — K922 Gastrointestinal hemorrhage, unspecified: Secondary | ICD-10-CM | POA: Diagnosis not present

## 2023-05-15 DIAGNOSIS — C169 Malignant neoplasm of stomach, unspecified: Secondary | ICD-10-CM

## 2023-05-15 LAB — TYPE AND SCREEN
ABO/RH(D): A POS
Antibody Screen: NEGATIVE
Unit division: 0
Unit division: 0

## 2023-05-15 LAB — CBC
HCT: 29.9 % — ABNORMAL LOW (ref 39.0–52.0)
Hemoglobin: 9.4 g/dL — ABNORMAL LOW (ref 13.0–17.0)
MCH: 24.4 pg — ABNORMAL LOW (ref 26.0–34.0)
MCHC: 31.4 g/dL (ref 30.0–36.0)
MCV: 77.7 fL — ABNORMAL LOW (ref 80.0–100.0)
Platelets: 455 10*3/uL — ABNORMAL HIGH (ref 150–400)
RBC: 3.85 MIL/uL — ABNORMAL LOW (ref 4.22–5.81)
RDW: 24.9 % — ABNORMAL HIGH (ref 11.5–15.5)
WBC: 20.3 10*3/uL — ABNORMAL HIGH (ref 4.0–10.5)
nRBC: 0.1 % (ref 0.0–0.2)

## 2023-05-15 LAB — BASIC METABOLIC PANEL
Anion gap: 19 — ABNORMAL HIGH (ref 5–15)
BUN: 34 mg/dL — ABNORMAL HIGH (ref 8–23)
CO2: 31 mmol/L (ref 22–32)
Calcium: 8 mg/dL — ABNORMAL LOW (ref 8.9–10.3)
Chloride: 89 mmol/L — ABNORMAL LOW (ref 98–111)
Creatinine, Ser: 2.35 mg/dL — ABNORMAL HIGH (ref 0.61–1.24)
GFR, Estimated: 26 mL/min — ABNORMAL LOW (ref >60)
Glucose, Bld: 206 mg/dL — ABNORMAL HIGH (ref 70–99)
Potassium: 4 mmol/L (ref 3.5–5.1)
Sodium: 139 mmol/L (ref 135–145)

## 2023-05-15 LAB — BPAM RBC
Blood Product Expiration Date: 202412122359
Blood Product Expiration Date: 202412132359
ISSUE DATE / TIME: 202411142026
ISSUE DATE / TIME: 202411142255
Unit Type and Rh: 6200
Unit Type and Rh: 6200

## 2023-05-15 MED ORDER — HYDROMORPHONE HCL 1 MG/ML IJ SOLN: 0.2500 mg | INTRAMUSCULAR | Status: DC | PRN

## 2023-05-15 MED ORDER — OXYCODONE HCL 5 MG PO TABS: 2.5000 mg | ORAL_TABLET | Freq: Four times a day (QID) | ORAL | Status: DC | PRN

## 2023-05-15 MED ORDER — PANTOPRAZOLE SODIUM 40 MG IV SOLR
40.0000 mg | Freq: Two times a day (BID) | INTRAVENOUS | Status: DC
  Administered 2023-05-15 – 2023-05-18 (×8): 40 mg via INTRAVENOUS
  Filled 2023-05-15 (×8): qty 10

## 2023-05-15 MED ORDER — HYDROMORPHONE HCL 1 MG/ML IJ SOLN
0.2500 mg | Freq: Once | INTRAMUSCULAR | Status: AC
  Administered 2023-05-15: 0.25 mg via INTRAVENOUS
  Filled 2023-05-15: qty 1

## 2023-05-15 NOTE — Assessment & Plan Note (Signed)
-   Comfort measures, no CBGs - Hold glipizide

## 2023-05-15 NOTE — ED Notes (Signed)
ED TO INPATIENT HANDOFF REPORT  ED Nurse Name and Phone #: Reita Cliche RN  S Name/Age/Gender [name] 89 y.o. male Room/Bed: RESA/RESA  Code Status   Code Status: Limited: Do not attempt resuscitation (DNR) -DNR-LIMITED -Do Not Intubate/DNI   Home/SNF/Other Home Patient oriented to: self Is this baseline? No   Triage Complete: Triage complete  Chief Complaint Acute post-hemorrhagic anemia [D62] Gastrointestinal hemorrhage [K92.2]  Triage Note Per EMS, Pt, from [facility], c/o abdominal pain and n/v.  Hx of stomach CA.    Hospice staff reported he had been vomiting "black stuff."     Allergies No Known Allergies  Level of Care/Admitting Diagnosis ED Disposition     ED Disposition  Admit   Condition  --   Comment  Hospital Area: [facility] [[id number]]  Level of Care: Med-Surg [16]  May admit patient to [facility] or [facility] if equivalent level of care is available:: No  Covid Evaluation: Asymptomatic - no recent exposure (last 10 days) testing not required  Diagnosis: Gastrointestinal hemorrhage [696295]  Admitting Physician: Alberteen Sam [[id number]]  Attending Physician: Alberteen Sam [[id number]]  Certification:: I certify this patient will need inpatient services for at least 2 midnights          B Medical/Surgery History Past Medical History:  Diagnosis Date   Cancer (HCC)    History reviewed. No pertinent surgical history.   A IV Location/Drains/Wounds Patient Lines/Drains/Airways Status     Active Line/Drains/Airways     Name Placement date Placement time Site Days   Peripheral IV 05/30/2023 22 G Left;Posterior Hand 05/01/2023  1747  Hand  1   Peripheral IV 05/04/2023 20 G Right Antecubital 05/26/2023  1758  Antecubital  1            Intake/Output Last 24 hours No intake or output data in the 24 hours ending 05/15/23 0900  Labs/Imaging Results for orders placed or  performed during the hospital encounter of 05/20/2023 (from the past 48 hour(s))  Comprehensive metabolic panel     Status: Abnormal   Collection Time: 05/24/2023  5:55 PM  Result Value Ref Range   Sodium 138 135 - 145 mmol/L   Potassium 3.8 3.5 - 5.1 mmol/L   Chloride 87 (L) 98 - 111 mmol/L   CO2 29 22 - 32 mmol/L   Glucose, Bld 224 (H) 70 - 99 mg/dL    Comment: Glucose reference range applies only to samples taken after fasting for at least 8 hours.   BUN 26 (H) 8 - 23 mg/dL   Creatinine, Ser 5.36 (H) 0.61 - 1.24 mg/dL   Calcium 8.4 (L) 8.9 - 10.3 mg/dL   Total Protein 5.5 (L) 6.5 - 8.1 g/dL   Albumin 2.3 (L) 3.5 - 5.0 g/dL   AST 17 15 - 41 U/L   ALT 13 0 - 44 U/L   Alkaline Phosphatase 78 38 - 126 U/L   Total Bilirubin 0.8 <1.2 mg/dL   GFR, Estimated 33 (L) >60 mL/min    Comment: (NOTE) Calculated using the CKD-EPI Creatinine Equation (2021)    Anion gap 22 (H) 5 - 15    Comment: ELECTROLYTES REPEATED TO VERIFY Performed at [facility], [address] [place], [place] [address]   Lipase, blood     Status: None   Collection Time: 05/15/2023  5:55 PM  Result Value Ref Range   Lipase 25 11 - 51 U/L    Comment:  Performed at [facility], [address], [place], [place] [address]  CBC with Differential     Status: Abnormal   Collection Time: 05/15/2023  5:55 PM  Result Value Ref Range   WBC 15.5 (H) 4.0 - 10.5 K/uL   RBC 2.75 (L) 4.22 - 5.81 MIL/uL   Hemoglobin 5.3 (LL) 13.0 - 17.0 g/dL    Comment: Reticulocyte Hemoglobin testing may be clinically indicated, consider ordering this additional test [id number] THIS CRITICAL RESULT HAS VERIFIED AND BEEN CALLED TO CASSANDRA H., RN BY REGINA MANGUM ON 11 14 2024 AT 1839, AND HAS BEEN READ BACK.     HCT 19.2 (L) 39.0 - 52.0 %   MCV 69.8 (L) 80.0 - 100.0 fL   MCH 19.3 (L) 26.0 - 34.0 pg   MCHC 27.6 (L) 30.0 - 36.0 g/dL   RDW 11.9 (H) 14.7 - 82.9 %   Platelets 694 (H) 150 - 400 K/uL   nRBC 0.0 0.0  - 0.2 %   Neutrophils Relative % 90 %   Neutro Abs 14.0 (H) 1.7 - 7.7 K/uL   Lymphocytes Relative 5 %   Lymphs Abs 0.8 0.7 - 4.0 K/uL   Monocytes Relative 4 %   Monocytes Absolute 0.6 0.1 - 1.0 K/uL   Eosinophils Relative 0 %   Eosinophils Absolute 0.0 0.0 - 0.5 K/uL   Basophils Relative 0 %   Basophils Absolute 0.0 0.0 - 0.1 K/uL   Immature Granulocytes 1 %   Abs Immature Granulocytes 0.07 0.00 - 0.07 K/uL   Polychromasia PRESENT    Target Cells PRESENT     Comment: Performed at [facility], [address], [place], [place] [address]  Protime-INR     Status: None   Collection Time: 05/07/2023  5:55 PM  Result Value Ref Range   Prothrombin Time 14.7 11.4 - 15.2 seconds   INR 1.1 0.8 - 1.2    Comment: (NOTE) INR goal varies based on device and disease states. Performed at [facility], [address], [place], [place] [address]   Type and screen [facility]     Status: None (Preliminary result)   Collection Time: 05/13/2023  5:55 PM  Result Value Ref Range   ABO/RH(D) A POS    Antibody Screen NEG    Sample Expiration      05/17/2023,2359 Performed at [facility], [address], [place], [place] [address]    Unit Number [id number]    Blood Component Type RED CELLS,LR    Unit division 00    Status of Unit ISSUED    Transfusion Status OK TO TRANSFUSE    Crossmatch Result Compatible    Unit Number [id number]    Blood Component Type RED CELLS,LR    Unit division 00    Status of Unit ISSUED    Transfusion Status OK TO TRANSFUSE    Crossmatch Result Compatible   POC occult blood, ED     Status: Abnormal   Collection Time: 05/18/2023  6:13 PM  Result Value Ref Range   Fecal Occult Bld POSITIVE (A) NEGATIVE  Prepare RBC (crossmatch)     Status: None   Collection Time: 05/15/2023  6:44 PM  Result Value Ref Range   Order Confirmation      ORDER PROCESSED BY BLOOD BANK Performed at [facility], [address] [place], [place] [address]   ABO/Rh     Status: None   Collection Time:  05/29/2023  7:55 PM  Result Value Ref Range   ABO/RH(D)      A POS Performed at [facility], [address], [place], [place] [address]   Prepare RBC (crossmatch)     Status: None   Collection Time: 05/09/2023  8:55 PM  Result Value Ref Range   Order Confirmation      ORDER PROCESSED BY BLOOD BANK Performed at [facility], [address], [place], [place] [address]   Basic metabolic panel     Status: Abnormal   Collection Time: 05/15/23  6:10 AM  Result Value Ref Range   Sodium 139 135 - 145 mmol/L   Potassium 4.0 3.5 - 5.1 mmol/L   Chloride 89 (L) 98 - 111 mmol/L   CO2 31 22 - 32 mmol/L   Glucose, Bld 206 (H) 70 - 99 mg/dL    Comment: Glucose reference range applies only to samples taken after fasting for at least 8 hours.   BUN 34 (H) 8 - 23 mg/dL   Creatinine, Ser 6.57 (H) 0.61 - 1.24 mg/dL   Calcium 8.0 (L) 8.9 - 10.3 mg/dL   GFR, Estimated 26 (L) >60 mL/min    Comment: (NOTE) Calculated using the CKD-EPI Creatinine Equation (2021)    Anion gap 19 (H) 5 - 15    Comment: Performed at [facility], [address] [place], [place] [address]  CBC     Status: Abnormal   Collection Time: 05/15/23  6:10 AM  Result Value Ref Range   WBC 20.3 (H) 4.0 - 10.5 K/uL   RBC 3.85 (L) 4.22 - 5.81 MIL/uL   Hemoglobin 9.4 (L) 13.0 - 17.0 g/dL    Comment: REPEATED TO VERIFY POST TRANSFUSION SPECIMEN DELTA CHECK NOTED    HCT 29.9 (L) 39.0 - 52.0 %   MCV 77.7 (L) 80.0 - 100.0 fL    Comment: POST TRANSFUSION SPECIMEN REPEATED TO VERIFY DELTA CHECK NOTED    MCH 24.4 (L) 26.0 - 34.0 pg   MCHC 31.4 30.0 - 36.0 g/dL   RDW 29.5 (H) 28.4 - 13.2 %   Platelets 455 (H) 150 - 400 K/uL   nRBC 0.1 0.0 - 0.2 %    Comment: Performed at [facility], [address], [place], [place] [address]   No results  found.  Pending Labs Unresulted Labs (From admission, onward)    None       Vitals/Pain Today's Vitals   05/15/23 0615 05/15/23 0630 05/15/23 0709 05/15/23 0715  BP: 132/77 122/78 133/75 138/80  Pulse: (!) 107 (!) 109 (!) 110 (!) 109  Resp: (!) 22 17 19  (!) 21  Temp:  97.8 F (36.6 C) (!) 97.5 F (36.4 C)   TempSrc:  Oral    SpO2: 99% 100% 100% 100%  PainSc:        Isolation Precautions No active isolations  Medications Medications  fentaNYL (SUBLIMAZE) injection 50 mcg (50 mcg Intravenous Given 05/15/23 0433)  0.9 %  sodium chloride infusion (Manually program via Guardrails IV Fluids) (has no administration in time range)  0.9 %  sodium chloride infusion (Manually program via Guardrails IV Fluids) (has no administration in time range)  ondansetron (ZOFRAN) tablet 4 mg ( Oral See Alternative 05/15/23 0835)    Or  ondansetron (ZOFRAN) injection 4 mg (4 mg Intravenous Given 05/15/23 0835)  melatonin tablet 3 mg (has no administration in time range)  acetaminophen (TYLENOL) tablet 650 mg (has no administration in  time range)    Or  acetaminophen (TYLENOL) suppository 650 mg (has no administration in time range)  pantoprazole (PROTONIX) injection 40 mg (40 mg Intravenous Given 05/15/23 0607)  oxyCODONE (Oxy IR/ROXICODONE) immediate release tablet 2.5 mg (has no administration in time range)  HYDROmorphone (DILAUDID) injection 0.25 mg (has no administration in time range)  sodium chloride 0.9 % bolus 1,000 mL (0 mLs Intravenous Stopped 05/24/2023 1932)  fentaNYL (SUBLIMAZE) injection 50 mcg (50 mcg Intravenous Given 05/15/2023 1801)  pantoprazole (PROTONIX) injection 80 mg (80 mg Intravenous Given 05/08/2023 1811)  acetaminophen (OFIRMEV) IV 1,000 mg (0 mg Intravenous Stopped 05/08/2023 2241)  HYDROmorphone (DILAUDID) injection 0.25 mg (0.25 mg Intravenous Given 05/15/23 0257)    Mobility non-ambulatory     Focused Assessments Cardiac Assessment Handoff:  Cardiac Rhythm:  Sinus tachycardia No results found for: "CKTOTAL", "CKMB", "CKMBINDEX", "TROPONINI" No results found for: "DDIMER" Does the Patient currently have chest pain? No    R Recommendations: See Admitting Provider Note  Report given to:   Additional Notes:

## 2023-05-15 NOTE — Progress Notes (Signed)
  Progress Note   Patient: [name] OAC:[id number] DOB: 07-23-1932 DOA: 05/26/2023     1 DOS: the patient was seen and examined on 05/15/2023 at 9:10AM      Brief hospital course: 89 y.o. 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      Assessment and Plan: * Gastrointestinal hemorrhage Transfused 2 units overnight and Hgb up post-transfusion.  No further vomiting or bowel movements. - Continue PPI for comfort - Fentanyl for comfort - No further transfusions, no further   Acute blood loss anemia Transfused 2 units.  Discussed with POA and daughter, no further escalation of care, no fluids.  Full comfort measures.  Type 2 diabetes mellitus with chronic kidney disease, without long-term current use of insulin (HCC) - Comfort measures, no CBGs - Hold glipizide  Acute renal failure (ARF) (HCC) Creatinine at baseline 0.8, was up to 1.4 earlier this month, here is 2.3.   - Full comfort measures  Essential hypertension BP soft - Hold amlodipine, furosemide, losartan  Gastric adenocarcinoma (HCC) Found to have cratered ulcer on CT in Feb, underwent EGD in April showed malignant ulcer.  Discussed surgery, chemotherapy, immunotherapy with Oncology, patient declined, requested Hospice. Has been in Hospice at Independent Living facility since then, doing well.          Subjective: Patient reports pain, he appears tired.  No further bleeding.  Family at bedside.     Physical Exam: BP (!) 105/90 (BP Location: Left Arm)   Pulse (!) 116   Temp (!) 97.5 F (36.4 C) (Oral)   Resp 14   SpO2 100%   Frail elderly adult male, appears tired, grunting, eyes closed Tachycardic, regular, no peripheral edema Respiratory rate fast, rhythm shallow, diminished Abdomen without grimace to palpation, no guarding, but overall somewhat  distended Attention diminished, affect blunted, psychomotor slowing noted    Data Reviewed: CBC shows hemoglobin improved Creatinine worsening  Family Communication: Son by phone, daughter at bedside    Disposition: Status is: Inpatient         Author: Alberteen Sam, MD 05/15/2023 5:28 PM  For on call review [email or web address].

## 2023-05-15 NOTE — Hospital Course (Signed)
89 y.o. 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

## 2023-05-15 NOTE — TOC Initial Note (Addendum)
Transition of Care [facility]) - Initial/Assessment Note    Patient Details  Name: [name] MRN: [id number] Date of Birth: 10/16/1932  Transition of Care [facility]) CM/SW Contact:    Amada Jupiter, LCSW Phone Number: 05/15/2023, 3:47 PM  Clinical Narrative:                  TOC order received to assist with Hospice referral.  Per MD, anticipating possible hospital death or need for residential Hospice placement. Spoke with pt's daughter, Darien Ramus, who has been able to speak with MD as well and aware of possible outcomes/ needs.   She confirms that pt was active with [facility] while a resident at [facility].  Have explained to her that [facility] does not have a residential hospice site and, if this is needed, would need to make referral to another hospice program.  She understands that and would like to look at either [facility] or [facility] and will discuss with family.   At this time, will ask the weekend [facility] coverage to please check status of pt in the morning and, if residential is recommended, that they follow up with daughter for choice and make the appropriate referral.  (Of note, have spoken with the [facility] liaison, Vangie Bicker who is aware of the above and will dc pt from their agency coverage.)  Expected Discharge Plan: Hospice Medical Facility (vs. hospital death)     Patient Goals and CMS Choice Patient states their goals for this hospitalization and ongoing recovery are:: comfort          Expected Discharge Plan and Services In-house Referral: Clinical Social Work   Post Acute Care Choice: Hospice Living arrangements for the past 2 months: Assisted Living Facility [employer] forces logistics/support/administrative officer)                                      Prior Living Arrangements/Services Living arrangements for the past 2 months: Assisted Living Facility ([facility]) Lives with:: Facility Resident Patient language and need for interpreter reviewed:: Yes         Need for Family Participation in Patient Care: No (Comment) Care giver support system in place?: Yes (comment)   Criminal Activity/Legal Involvement Pertinent to Current Situation/Hospitalization: No - Comment as needed  Activities of Daily Living   ADL Screening (condition at time of admission) Independently performs ADLs?: No Does the patient have a NEW difficulty with bathing/dressing/toileting/self-feeding that is expected to last >3 days?: No Does the patient have a NEW difficulty with getting in/out of bed, walking, or climbing stairs that is expected to last >3 days?: No Does the patient have a NEW difficulty with communication that is expected to last >3 days?: No Is the patient deaf or have difficulty hearing?: No Does the patient have difficulty seeing, even when wearing glasses/contacts?: No Does the patient have difficulty concentrating, remembering, or making decisions?: No  Permission Sought/Granted Permission sought to share information with : Family Supports    Share Information with NAME: daughter, Erlinda Hong @ [phone]           Emotional Assessment Appearance:: Appears stated age Attitude/Demeanor/Rapport: Unresponsive          Admission diagnosis:  Acute post-hemorrhagic anemia [D62] Gastrointestinal hemorrhage [K92.2] Gastrointestinal hemorrhage, unspecified gastrointestinal hemorrhage type [K92.2] Hypotension due to hypovolemia [E86.1] Patient Active Problem List   Diagnosis Date Noted   Acute blood loss  anemia 05/15/2023   Acute renal failure (ARF) (HCC) 05/15/2023   Gastrointestinal hemorrhage 05/25/2023   Gastric adenocarcinoma (HCC) 02/08/2023   Essential hypertension 02/08/2023   PCP:  Patient, No Pcp Per Pharmacy:   [facility] - [place], [place] - [address] [name] [address] Phone: [phone] Fax: [phone]     Social Determinants of Health (SDOH) Social History:   SDOH  Interventions:     Readmission Risk Interventions    05/15/2023    3:43 PM  Readmission Risk Prevention Plan  Transportation Screening Complete  PCP or Specialist Appt within 5-7 Days Complete  Home Care Screening Complete  Medication Review (RN CM) Complete

## 2023-05-15 NOTE — ED Notes (Signed)
  Patient to receive 2 units of RBCs.  Patient was receiving first unit when order for 2 was placed but confirmed with admitting team that he should only receive 2 units of RBCs at this time.  Will reevaluate after morning labs to see if additional units are required.

## 2023-05-15 NOTE — Assessment & Plan Note (Signed)
BP soft - Hold amlodipine, furosemide, losartan

## 2023-05-15 NOTE — Assessment & Plan Note (Signed)
Transfused 2 units overnight and Hgb up post-transfusion.  No further vomiting or bowel movements. - Continue PPI for comfort - Fentanyl for comfort - No further transfusions, no further

## 2023-05-15 NOTE — Assessment & Plan Note (Signed)
Found to have cratered ulcer on CT in Feb, underwent EGD in April showed malignant ulcer.  Discussed surgery, chemotherapy, immunotherapy with Oncology, patient declined, requested Hospice. Has been in Hospice at Independent Living facility since then, doing well.

## 2023-05-15 NOTE — Assessment & Plan Note (Signed)
Transfused 2 units.  Discussed with POA and daughter, no further escalation of care, no fluids.  Full comfort measures.

## 2023-05-15 NOTE — ED Notes (Signed)
Pt was cleaned and changed into a new adult brief with assistance from this NT and pt's home caregiver.

## 2023-05-15 NOTE — Progress Notes (Signed)
Patient unable to answer admission questions, no family is at bedside.

## 2023-05-15 NOTE — Assessment & Plan Note (Signed)
Creatinine at baseline 0.8, was up to 1.4 earlier this month, here is 2.3.   - Full comfort measures

## 2023-05-16 DIAGNOSIS — D62 Acute posthemorrhagic anemia: Secondary | ICD-10-CM | POA: Diagnosis not present

## 2023-05-16 DIAGNOSIS — I10 Essential (primary) hypertension: Secondary | ICD-10-CM | POA: Diagnosis not present

## 2023-05-16 DIAGNOSIS — E1122 Type 2 diabetes mellitus with diabetic chronic kidney disease: Secondary | ICD-10-CM | POA: Diagnosis not present

## 2023-05-16 DIAGNOSIS — K922 Gastrointestinal hemorrhage, unspecified: Secondary | ICD-10-CM | POA: Diagnosis not present

## 2023-05-16 MED ORDER — LORAZEPAM 2 MG/ML IJ SOLN
1.0000 mg | Freq: Four times a day (QID) | INTRAMUSCULAR | Status: DC | PRN
  Administered 2023-05-16: 1 mg via INTRAVENOUS
  Filled 2023-05-16: qty 1

## 2023-05-16 NOTE — Progress Notes (Signed)
  Progress Note   Patient: [name] RUE:[id number] DOB: [birth year] DOA: 05/02/2023     2 DOS: the patient was seen and examined on 05/16/2023        Brief hospital course: 89 y.o. 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      Assessment and Plan: Principal Problem:   Gastrointestinal hemorrhage Active Problems:   Acute blood loss anemia   Gastric adenocarcinoma (HCC)   Essential hypertension   Acute renal failure (ARF) (HCC)   Type 2 diabetes mellitus with chronic kidney disease, without long-term current use of insulin (HCC)   The patient was admitted and transfused 2 units.  Based on the nature of his GI bleed P-Care (with malignancy), previous opinion by Gastroenterology that no endoscopic treatment would be curative, and previous discussions with the patient and with his healthcare power of attorney and daughter, the decision was made to pursue full comfort measures.  -Continue PPI for comfort - Bolus Fentanyl and Ativan for comfort - If Ativan no relief this morning, will start Fentanyl drip - No further transfusions, no fluids, no escalation of care - No CBGs, hold glipizide - Hold antihypertensives -Consult to hospice for residential hospice       Subjective: Patient got several doses of IV fentanyl overnight, he has had no clinical change, his mentation is still poor, he had a black vomit earlier this morning.     Physical Exam: BP (!) 102/52 (BP Location: Right Arm)   Pulse (!) 117   Temp 98.4 F (36.9 C) (Oral)   Resp 14   SpO2 100%   Frail elderly adult male, lying in bed, weak and tired, grunting at times, eyes closed, makes eye contact, then closes his eyes again Tachycardic, regular, no peripheral edema Respiratory rate fast, rhythm shallow, diminished Abdomen without grimace to palpation, no guarding  but overall distended Attention diminished, affect blunted, psychomotor slowing noted  Data Reviewed: Labs deferred  Family Communication: None at the bedside    Disposition: Status is: Inpatient         Author: Alberteen Sam, MD 05/16/2023 9:49 AM  For on call review [email or web address].

## 2023-05-16 NOTE — Progress Notes (Signed)
[facility] Liaison Note Received request from [place], Transitions of Care Manager, for hospice services at inpatient hospice facility. Visited patient and daughter to initiate education related to hospice philosophy, services, inpatient hospice care. Family verbalized understanding of information given.    Patient is approved for admission to [facility] by Dr. Elliot Gurney and we can offer a bed today. However, when discussing this with Mr. Ridriguez's daughter she reports that she prefers to wait until at least tomorrow to consider moving her father. TOC made aware and Hospice Liaisons will follow patient tomorrow.   Thank you for the opportunity to participate in this patient's care.   Glenna Fellows BSN, Charity fundraiser, OCN [employer] [phone]

## 2023-05-16 NOTE — Plan of Care (Signed)
  Problem: Education: Goal: Knowledge of General Education information will improve Description: Including pain rating scale, medication(s)/side effects and non-pharmacologic comfort measures Outcome: Not Applicable   Problem: Health Behavior/Discharge Planning: Goal: Ability to manage health-related needs will improve Outcome: Not Applicable   Problem: Clinical Measurements: Goal: Ability to maintain clinical measurements within normal limits will improve Outcome: Not Applicable Goal: Will remain free from infection Outcome: Not Applicable Goal: Diagnostic test results will improve Outcome: Not Applicable  End of life care

## 2023-05-16 NOTE — TOC Progression Note (Signed)
Transition of Care [facility]) - Progression Note    Patient Details  Name: Emeril Bellman MRN: [id number] Date of Birth: 09-08-1932  Transition [facility] Care [facility]) CM/SW Contact  Georgie Chard, [place] Phone Number: 05/16/2023, 1:28 PM  Clinical Narrative:     PER HOSPICE [facility] :Patient is approved for a bed at [facility] but his daughter has decided that she wants to wait at least 24 hours to consider transferring to [facility]. With his rapid decline I think she is anticipating a hospital death and would not want to move him since he appears comfortable at this  time.   Expected Discharge Plan: Hospice Medical Facility (vs. hospital death)    Expected Discharge Plan and Services In-house Referral: Clinical Social Work   Post Acute Care Choice: Hospice Living arrangements for the past 2 months: Assisted Living Facility ([facility])                                       Social Determinants of Health (SDOH) Interventions    Readmission Risk Interventions    05/15/2023    3:43 PM  Readmission Risk Prevention Plan  Transportation Screening Complete  PCP or Specialist Appt within 5-7 Days Complete  Home Care Screening Complete  Medication Review (RN CM) Complete

## 2023-05-17 DIAGNOSIS — E1122 Type 2 diabetes mellitus with diabetic chronic kidney disease: Secondary | ICD-10-CM | POA: Diagnosis not present

## 2023-05-17 DIAGNOSIS — I10 Essential (primary) hypertension: Secondary | ICD-10-CM | POA: Diagnosis not present

## 2023-05-17 DIAGNOSIS — K922 Gastrointestinal hemorrhage, unspecified: Secondary | ICD-10-CM | POA: Diagnosis not present

## 2023-05-17 DIAGNOSIS — D62 Acute posthemorrhagic anemia: Secondary | ICD-10-CM | POA: Diagnosis not present

## 2023-05-17 MED ORDER — LORAZEPAM 2 MG/ML PO CONC: 1.0000 mg | ORAL | Status: DC | PRN

## 2023-05-17 MED ORDER — MORPHINE SULFATE (CONCENTRATE) 10 MG /0.5 ML PO SOLN
20.0000 mg | ORAL | Status: DC | PRN
  Administered 2023-05-17: 20 mg via ORAL
  Filled 2023-05-17: qty 1

## 2023-05-17 MED ORDER — LORAZEPAM 1 MG PO TABS
2.0000 mg | ORAL_TABLET | ORAL | Status: DC | PRN
  Administered 2023-05-17: 2 mg via ORAL
  Filled 2023-05-17: qty 2

## 2023-05-17 NOTE — Progress Notes (Signed)
[name] Liaison Note  Following up with referral for [facility] transfer. Spoke with daughter who at this time does not want to move her father. She will notify us if her decision changes.    TOC made aware and Hospice Liaisons will follow patient tomorrow.    Thank you for the opportunity to participate in this patient's care.    Glenna Fellows BSN, Charity fundraiser, OCN [employer] [phone]

## 2023-05-17 NOTE — Progress Notes (Signed)
  Progress Note   Patient: [name] ZOX:[id number] DOB: June 22, 1933 DOA: 05/06/2023     3 DOS: the patient was seen and examined on 05/17/2023        Brief hospital course: 89 y.o. 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      Assessment and Plan: Principal Problem:   Gastrointestinal hemorrhage Active Problems:   Acute blood loss anemia   Gastric adenocarcinoma (HCC)   Essential hypertension   Acute renal failure (ARF) (HCC)   Type 2 diabetes mellitus with chronic kidney disease, without long-term current use of insulin (HCC)   The patient was admitted and transfused 2 units.  Based on the nature of his GI bleed P-Care (with malignancy), previous opinion by Gastroenterology that no endoscopic treatment would be curative, and previous discussions with the patient and with his healthcare power of attorney and daughter, the decision was made to pursue full comfort measures.  - Replace IV - Start Dilaudid drip - PRN Ativan for anxiety - No further transfusions, no fluids, no escalation of care - No CBGs, hold glipizide - Hold antihypertensives        Subjective: No change.  Patient asking for water.  He is weak and tired.     Physical Exam: BP 116/70 (BP Location: Left Arm)   Pulse (!) 115   Temp 98.3 F (36.8 C) (Oral)   Resp 20   SpO2 94%   Frail elderly male, asking me to "push him", but cannot articulate anything else Tachycardic, regular, no murmurs Respiratory rate fast, shallow, no rales obvious Abdomen soft, no grimace to palpation Face symmetric, voice weak, only repeats "push me" otherwise does not follow commands for me.  Asked nursing for water earlier.  Data Reviewed: Labs deferred  Family Communication: Daughter    Disposition: Status is: Inpatient         Author: Alberteen Sam, MD 05/17/2023 2:58 PM  For on call review [email or web address].

## 2023-05-17 NOTE — Plan of Care (Signed)
  Problem: Clinical Measurements: Goal: Respiratory complications will improve Outcome: Not Applicable   Problem: Pain Management: Goal: General experience of comfort will improve Outcome: Not Applicable

## 2023-05-17 NOTE — TOC Progression Note (Signed)
Transition of Care [facility]) - Progression Note   Patient Details  Name: [name] MRN: [id number] Date of Birth: 1933/02/16  Transition of Care [facility]) CM/SW Contact  Ewing Schlein, LCSW Phone Number: 05/17/2023, 2:53 PM  Clinical Narrative: Daughter does not want to transfer patient to [facility] at this time and [facility] cannot hold the bed. [facility] can be contacted for bed availability if daughter changes her mind.  Expected Discharge Plan: Hospice Medical Facility (vs. hospital death)  Expected Discharge Plan and Services In-house Referral: Clinical Social Work Post Acute Care Choice: Hospice Living arrangements for the past 2 months: Assisted Living Facility ([facility])  Readmission Risk Interventions    05/15/2023    3:43 PM  Readmission Risk Prevention Plan  Transportation Screening Complete  PCP or Specialist Appt within 5-7 Days Complete  Home Care Screening Complete  Medication Review (RN CM) Complete

## 2023-05-18 DIAGNOSIS — I10 Essential (primary) hypertension: Secondary | ICD-10-CM | POA: Diagnosis not present

## 2023-05-18 DIAGNOSIS — D62 Acute posthemorrhagic anemia: Secondary | ICD-10-CM | POA: Diagnosis not present

## 2023-05-18 DIAGNOSIS — C169 Malignant neoplasm of stomach, unspecified: Secondary | ICD-10-CM | POA: Diagnosis not present

## 2023-05-18 DIAGNOSIS — K922 Gastrointestinal hemorrhage, unspecified: Secondary | ICD-10-CM | POA: Diagnosis not present

## 2023-05-18 MED ORDER — HYDROMORPHONE HCL-NACL 50-0.9 MG/50ML-% IV SOLN
0.5000 mg/h | INTRAVENOUS | Status: DC
  Administered 2023-05-18: 0.5 mg/h via INTRAVENOUS
  Filled 2023-05-18: qty 50

## 2023-05-18 NOTE — Plan of Care (Signed)
  Problem: Activity: Goal: Risk for activity intolerance will decrease Outcome: Progressing   Problem: Nutrition: Goal: Adequate nutrition will be maintained Outcome: Progressing   Problem: Coping: Goal: Level of anxiety will decrease 05/18/2023 0717 by Marshell Garfinkel D, RN Outcome: Progressing 05/18/2023 0717 by Marshell Garfinkel D, RN Outcome: Progressing 05/18/2023 0716 by Marshell Garfinkel D, RN Outcome: Progressing   Problem: Skin Integrity: Goal: Risk for impaired skin integrity will decrease Outcome: Progressing

## 2023-05-18 NOTE — Plan of Care (Signed)
  Problem: Activity: Goal: Risk for activity intolerance will decrease Outcome: Progressing   Problem: Nutrition: Goal: Adequate nutrition will be maintained Outcome: Progressing   Problem: Coping: Goal: Level of anxiety will decrease 05/18/2023 0717 by Marshell Garfinkel D, RN Outcome: Progressing 05/18/2023 0716 by Marshell Garfinkel D, RN Outcome: Progressing   Problem: Skin Integrity: Goal: Risk for impaired skin integrity will decrease Outcome: Progressing

## 2023-05-18 NOTE — Progress Notes (Signed)
  Progress Note   Patient: [name] ZOX:[id number] DOB: 05-Jan-1933 DOA: 05/06/2023     4 DOS: the patient was seen and examined on 05/18/2023 at 9:09 AM      Brief hospital course: 89 y.o. 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      Assessment and Plan: Principal Problem:   Gastrointestinal hemorrhage Active Problems:   Acute blood loss anemia   Gastric adenocarcinoma (HCC)   Essential hypertension   Acute renal failure (ARF) (HCC)   Type 2 diabetes mellitus with chronic kidney disease, without long-term current use of insulin (HCC)   -Continue Dilaudid drip - As needed Ativan for anxiety, fentanyl for pain - Consult hospice         Subjective: Patient had only a few sips of water yesterday, half a cup of water this morning, and then this afternoon has been somnolent and had no more oral intake.  He had some complaints of back pain today, but mostly has been resting comfortably.     Physical Exam: BP 107/64 (BP Location: Right Arm)   Pulse (!) 108   Temp 97.9 F (36.6 C) (Oral)   Resp 14   SpO2 98%   Frail elderly adult male, no acute distress, tired Tachycardic, regular, no obvious murmurs, no pitting in the extremities Respiratory rate very shallow, diminished Abdomen without grimace to palpation Does not arouse for me today  Data Reviewed: Labs deferred  Family Communication: Daughter at the bedside    Disposition: Status is: Inpatient         Author: Alberteen Sam, MD 05/18/2023 4:06 PM  For on call review [email or web address].

## 2023-05-18 NOTE — Plan of Care (Signed)
  Problem: Coping: Goal: Level of anxiety will decrease Outcome: Progressing   Problem: Skin Integrity: Goal: Risk for impaired skin integrity will decrease Outcome: Progressing   

## 2023-05-19 ENCOUNTER — Encounter (HOSPITAL_COMMUNITY): Payer: Self-pay

## 2023-05-19 DIAGNOSIS — D62 Acute posthemorrhagic anemia: Secondary | ICD-10-CM

## 2023-05-19 DIAGNOSIS — K922 Gastrointestinal hemorrhage, unspecified: Secondary | ICD-10-CM

## 2023-05-31 NOTE — Death Summary Note (Signed)
   DEATH SUMMARY   Patient Details  Name: Celso Colocho MRN: [id number] DOB: 10-Sep-1932 [id number], No Pcp Per Admission/Discharge Information   Admit Date:  2023-06-12  Date of Death: Date of Death: 2023/06/17  Time of Death: Time of Death: 0814  Length of Stay: 5   Principle Cause of death: Acute renal failure due to gastrointestinal hemorrhage due to gastric cancer  Hospital Diagnoses: Principal Problem:   Acute renal failure due to gastrointestinal hemorrhage Active Problems:   Acute blood loss anemia   Gastric adenocarcinoma (HCC)   Essential hypertension   Type 2 diabetes mellitus with chronic kidney disease, without long-term current use of insulin [facility])   Hospital Course: 89 y.o. 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  The patient was admitted and transfused 2 units. Based on the nature of his GI bleed P-Care (with malignancy), previous opinion by Gastroenterology that no endoscopic treatment would be curative, and previous discussions with the patient and with his healthcare power of attorney and daughter, the decision was made to pursue full comfort measures. He was started on a dilaudid drip and passed comfortably.        The results of significant diagnostics from this hospitalization (including imaging, microbiology, ancillary and laboratory) are listed below for reference.   Significant Diagnostic Studies: No results found.  Microbiology: No results found for this or any previous visit (from the past 240 hour(s)).   Signed: Alberteen Sam, MD 06-17-2023

## 2023-05-31 NOTE — Plan of Care (Signed)
  Problem: Activity: Goal: Risk for activity intolerance will decrease Outcome: Not Progressing   Problem: Nutrition: Goal: Adequate nutrition will be maintained Outcome: Not Progressing   

## 2023-05-31 DEATH — deceased
